# Patient Record
Sex: Female | Born: 1960 | Race: Black or African American | Hispanic: No | Smoking: Never smoker
Health system: Southern US, Community
[De-identification: ages and names within clinical notes are randomized; demographics above are authoritative.]

## PROBLEM LIST (undated history)

## (undated) DIAGNOSIS — H269 Unspecified cataract: Secondary | ICD-10-CM

## (undated) DIAGNOSIS — G8929 Other chronic pain: Secondary | ICD-10-CM

## (undated) DIAGNOSIS — F32A Depression, unspecified: Secondary | ICD-10-CM

## (undated) DIAGNOSIS — K56609 Unspecified intestinal obstruction, unspecified as to partial versus complete obstruction: Secondary | ICD-10-CM

## (undated) DIAGNOSIS — F329 Major depressive disorder, single episode, unspecified: Secondary | ICD-10-CM

## (undated) DIAGNOSIS — M1990 Unspecified osteoarthritis, unspecified site: Secondary | ICD-10-CM

## (undated) DIAGNOSIS — I10 Essential (primary) hypertension: Secondary | ICD-10-CM

## (undated) DIAGNOSIS — M545 Low back pain, unspecified: Secondary | ICD-10-CM

## (undated) DIAGNOSIS — G4700 Insomnia, unspecified: Secondary | ICD-10-CM

## (undated) DIAGNOSIS — R011 Cardiac murmur, unspecified: Secondary | ICD-10-CM

## (undated) DIAGNOSIS — E785 Hyperlipidemia, unspecified: Secondary | ICD-10-CM

## (undated) DIAGNOSIS — J45909 Unspecified asthma, uncomplicated: Secondary | ICD-10-CM

## (undated) DIAGNOSIS — J42 Unspecified chronic bronchitis: Secondary | ICD-10-CM

## (undated) DIAGNOSIS — K219 Gastro-esophageal reflux disease without esophagitis: Secondary | ICD-10-CM

## (undated) DIAGNOSIS — Z9289 Personal history of other medical treatment: Secondary | ICD-10-CM

## (undated) DIAGNOSIS — J449 Chronic obstructive pulmonary disease, unspecified: Secondary | ICD-10-CM

## (undated) HISTORY — PX: COMBINED HYSTEROSCOPY DIAGNOSTIC / D&C: SUR297

## (undated) HISTORY — PX: BOWEL RESECTION: SHX1257

## (undated) HISTORY — PX: JOINT REPLACEMENT: SHX530

## (undated) HISTORY — PX: HERNIA REPAIR: SHX51

---
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
## 2000-02-23 HISTORY — PX: ADENOIDECTOMY: SUR15

## 2000-12-05 ENCOUNTER — Inpatient Hospital Stay (HOSPITAL_COMMUNITY): Admission: AD | Admit: 2000-12-05 | Discharge: 2000-12-05 | Payer: Self-pay | Admitting: Obstetrics

## 2001-02-22 HISTORY — PX: ABDOMINAL HYSTERECTOMY: SHX81

## 2001-02-22 HISTORY — PX: ABDOMINAL EXPLORATION SURGERY: SHX538

## 2001-05-03 ENCOUNTER — Emergency Department (HOSPITAL_COMMUNITY): Admission: EM | Admit: 2001-05-03 | Discharge: 2001-05-03 | Payer: Self-pay | Admitting: Emergency Medicine

## 2001-06-29 ENCOUNTER — Encounter: Admission: RE | Admit: 2001-06-29 | Discharge: 2001-06-29 | Payer: Self-pay | Admitting: Obstetrics and Gynecology

## 2001-07-07 ENCOUNTER — Ambulatory Visit (HOSPITAL_COMMUNITY): Admission: RE | Admit: 2001-07-07 | Discharge: 2001-07-07 | Payer: Self-pay | Admitting: *Deleted

## 2001-07-20 ENCOUNTER — Encounter: Admission: RE | Admit: 2001-07-20 | Discharge: 2001-07-20 | Payer: Self-pay | Admitting: *Deleted

## 2001-08-07 ENCOUNTER — Encounter (INDEPENDENT_AMBULATORY_CARE_PROVIDER_SITE_OTHER): Payer: Self-pay | Admitting: Specialist

## 2001-08-07 ENCOUNTER — Ambulatory Visit (HOSPITAL_COMMUNITY): Admission: RE | Admit: 2001-08-07 | Discharge: 2001-08-07 | Payer: Self-pay | Admitting: Obstetrics and Gynecology

## 2001-09-20 ENCOUNTER — Encounter: Admission: RE | Admit: 2001-09-20 | Discharge: 2001-09-20 | Payer: Self-pay | Admitting: Gastroenterology

## 2001-09-20 ENCOUNTER — Encounter: Payer: Self-pay | Admitting: Gastroenterology

## 2001-09-26 ENCOUNTER — Encounter: Admission: RE | Admit: 2001-09-26 | Discharge: 2001-09-26 | Payer: Self-pay | Admitting: *Deleted

## 2001-10-19 ENCOUNTER — Encounter: Payer: Self-pay | Admitting: Gastroenterology

## 2001-10-19 ENCOUNTER — Encounter: Admission: RE | Admit: 2001-10-19 | Discharge: 2001-10-19 | Payer: Self-pay | Admitting: Gastroenterology

## 2001-10-24 ENCOUNTER — Inpatient Hospital Stay (HOSPITAL_COMMUNITY): Admission: RE | Admit: 2001-10-24 | Discharge: 2001-10-31 | Payer: Self-pay | Admitting: Obstetrics and Gynecology

## 2001-10-24 ENCOUNTER — Encounter (INDEPENDENT_AMBULATORY_CARE_PROVIDER_SITE_OTHER): Payer: Self-pay | Admitting: Specialist

## 2001-10-26 ENCOUNTER — Encounter: Payer: Self-pay | Admitting: Obstetrics and Gynecology

## 2001-10-26 ENCOUNTER — Encounter: Payer: Self-pay | Admitting: *Deleted

## 2001-11-07 ENCOUNTER — Encounter: Admission: RE | Admit: 2001-11-07 | Discharge: 2001-11-07 | Payer: Self-pay | Admitting: *Deleted

## 2001-12-15 ENCOUNTER — Ambulatory Visit (HOSPITAL_COMMUNITY): Admission: RE | Admit: 2001-12-15 | Discharge: 2001-12-15 | Payer: Self-pay | Admitting: Internal Medicine

## 2001-12-15 ENCOUNTER — Encounter: Payer: Self-pay | Admitting: Internal Medicine

## 2002-01-22 ENCOUNTER — Encounter: Payer: Self-pay | Admitting: Emergency Medicine

## 2002-01-22 ENCOUNTER — Emergency Department (HOSPITAL_COMMUNITY): Admission: EM | Admit: 2002-01-22 | Discharge: 2002-01-22 | Payer: Self-pay | Admitting: Emergency Medicine

## 2002-02-22 HISTORY — PX: ABDOMINAL EXPLORATION SURGERY: SHX538

## 2002-02-28 ENCOUNTER — Ambulatory Visit (HOSPITAL_COMMUNITY): Admission: RE | Admit: 2002-02-28 | Discharge: 2002-02-28 | Payer: Self-pay | Admitting: Gastroenterology

## 2002-03-01 ENCOUNTER — Ambulatory Visit (HOSPITAL_COMMUNITY): Admission: RE | Admit: 2002-03-01 | Discharge: 2002-03-01 | Payer: Self-pay | Admitting: Gastroenterology

## 2002-03-22 ENCOUNTER — Other Ambulatory Visit: Admission: RE | Admit: 2002-03-22 | Discharge: 2002-03-22 | Payer: Self-pay | Admitting: Obstetrics & Gynecology

## 2002-03-28 ENCOUNTER — Emergency Department (HOSPITAL_COMMUNITY): Admission: EM | Admit: 2002-03-28 | Discharge: 2002-03-28 | Payer: Self-pay | Admitting: Emergency Medicine

## 2002-03-28 ENCOUNTER — Encounter: Payer: Self-pay | Admitting: Emergency Medicine

## 2002-08-26 ENCOUNTER — Emergency Department (HOSPITAL_COMMUNITY): Admission: EM | Admit: 2002-08-26 | Discharge: 2002-08-26 | Payer: Self-pay | Admitting: Emergency Medicine

## 2002-08-29 ENCOUNTER — Inpatient Hospital Stay (HOSPITAL_COMMUNITY): Admission: EM | Admit: 2002-08-29 | Discharge: 2002-10-12 | Payer: Self-pay

## 2002-08-29 ENCOUNTER — Encounter: Payer: Self-pay | Admitting: Internal Medicine

## 2002-08-30 ENCOUNTER — Encounter: Payer: Self-pay | Admitting: General Surgery

## 2002-08-30 ENCOUNTER — Encounter: Payer: Self-pay | Admitting: Internal Medicine

## 2002-08-31 ENCOUNTER — Encounter (INDEPENDENT_AMBULATORY_CARE_PROVIDER_SITE_OTHER): Payer: Self-pay | Admitting: Specialist

## 2002-08-31 ENCOUNTER — Encounter: Payer: Self-pay | Admitting: General Surgery

## 2002-08-31 ENCOUNTER — Encounter: Payer: Self-pay | Admitting: Cardiovascular Disease

## 2002-09-01 ENCOUNTER — Encounter: Payer: Self-pay | Admitting: General Surgery

## 2002-09-02 ENCOUNTER — Encounter: Payer: Self-pay | Admitting: General Surgery

## 2002-09-03 ENCOUNTER — Encounter: Payer: Self-pay | Admitting: Pulmonary Disease

## 2002-09-04 ENCOUNTER — Encounter: Payer: Self-pay | Admitting: General Surgery

## 2002-09-05 ENCOUNTER — Encounter: Payer: Self-pay | Admitting: General Surgery

## 2002-09-06 ENCOUNTER — Encounter: Payer: Self-pay | Admitting: General Surgery

## 2002-09-07 ENCOUNTER — Encounter: Payer: Self-pay | Admitting: Critical Care Medicine

## 2002-09-09 ENCOUNTER — Encounter: Payer: Self-pay | Admitting: General Surgery

## 2002-09-10 ENCOUNTER — Encounter: Payer: Self-pay | Admitting: Surgery

## 2002-09-11 ENCOUNTER — Encounter: Payer: Self-pay | Admitting: General Surgery

## 2002-09-12 ENCOUNTER — Encounter: Payer: Self-pay | Admitting: General Surgery

## 2002-09-17 ENCOUNTER — Encounter: Payer: Self-pay | Admitting: General Surgery

## 2002-09-29 ENCOUNTER — Encounter: Payer: Self-pay | Admitting: General Surgery

## 2002-09-30 ENCOUNTER — Encounter: Payer: Self-pay | Admitting: Cardiothoracic Surgery

## 2002-10-01 ENCOUNTER — Encounter: Payer: Self-pay | Admitting: General Surgery

## 2002-10-04 ENCOUNTER — Encounter (INDEPENDENT_AMBULATORY_CARE_PROVIDER_SITE_OTHER): Payer: Self-pay | Admitting: *Deleted

## 2003-09-01 ENCOUNTER — Emergency Department (HOSPITAL_COMMUNITY): Admission: EM | Admit: 2003-09-01 | Discharge: 2003-09-01 | Payer: Self-pay | Admitting: Emergency Medicine

## 2004-01-07 ENCOUNTER — Emergency Department (HOSPITAL_COMMUNITY): Admission: EM | Admit: 2004-01-07 | Discharge: 2004-01-07 | Payer: Self-pay | Admitting: Emergency Medicine

## 2004-03-13 ENCOUNTER — Encounter: Admission: RE | Admit: 2004-03-13 | Discharge: 2004-03-13 | Payer: Self-pay | Admitting: Internal Medicine

## 2004-09-29 ENCOUNTER — Ambulatory Visit: Payer: Self-pay | Admitting: Family Medicine

## 2004-11-11 ENCOUNTER — Ambulatory Visit (HOSPITAL_COMMUNITY): Admission: RE | Admit: 2004-11-11 | Discharge: 2004-11-11 | Payer: Self-pay | Admitting: Gastroenterology

## 2004-12-11 ENCOUNTER — Encounter: Admission: RE | Admit: 2004-12-11 | Discharge: 2004-12-11 | Payer: Self-pay | Admitting: Gastroenterology

## 2005-01-05 ENCOUNTER — Emergency Department (HOSPITAL_COMMUNITY): Admission: EM | Admit: 2005-01-05 | Discharge: 2005-01-05 | Payer: Self-pay | Admitting: Emergency Medicine

## 2005-01-18 ENCOUNTER — Ambulatory Visit: Payer: Self-pay | Admitting: Family Medicine

## 2005-02-06 ENCOUNTER — Emergency Department (HOSPITAL_COMMUNITY): Admission: EM | Admit: 2005-02-06 | Discharge: 2005-02-06 | Payer: Self-pay | Admitting: Emergency Medicine

## 2005-02-20 ENCOUNTER — Emergency Department (HOSPITAL_COMMUNITY): Admission: EM | Admit: 2005-02-20 | Discharge: 2005-02-20 | Payer: Self-pay | Admitting: Family Medicine

## 2005-02-20 ENCOUNTER — Emergency Department (HOSPITAL_COMMUNITY): Admission: EM | Admit: 2005-02-20 | Discharge: 2005-02-20 | Payer: Self-pay | Admitting: Emergency Medicine

## 2005-03-29 ENCOUNTER — Ambulatory Visit: Payer: Self-pay | Admitting: Family Medicine

## 2005-04-02 ENCOUNTER — Ambulatory Visit: Payer: Self-pay | Admitting: Internal Medicine

## 2005-04-05 ENCOUNTER — Ambulatory Visit: Payer: Self-pay | Admitting: Family Medicine

## 2005-04-07 ENCOUNTER — Emergency Department (HOSPITAL_COMMUNITY): Admission: EM | Admit: 2005-04-07 | Discharge: 2005-04-07 | Payer: Self-pay | Admitting: *Deleted

## 2005-05-24 ENCOUNTER — Emergency Department (HOSPITAL_COMMUNITY): Admission: EM | Admit: 2005-05-24 | Discharge: 2005-05-24 | Payer: Self-pay | Admitting: Emergency Medicine

## 2005-09-30 ENCOUNTER — Emergency Department (HOSPITAL_COMMUNITY): Admission: EM | Admit: 2005-09-30 | Discharge: 2005-09-30 | Payer: Self-pay | Admitting: Emergency Medicine

## 2005-12-31 ENCOUNTER — Encounter: Admission: RE | Admit: 2005-12-31 | Discharge: 2005-12-31 | Payer: Self-pay | Admitting: Internal Medicine

## 2006-01-03 ENCOUNTER — Emergency Department (HOSPITAL_COMMUNITY): Admission: EM | Admit: 2006-01-03 | Discharge: 2006-01-04 | Payer: Self-pay | Admitting: Emergency Medicine

## 2006-04-06 ENCOUNTER — Encounter: Admission: RE | Admit: 2006-04-06 | Discharge: 2006-04-06 | Payer: Self-pay | Admitting: General Surgery

## 2006-06-20 ENCOUNTER — Ambulatory Visit (HOSPITAL_COMMUNITY): Admission: RE | Admit: 2006-06-20 | Discharge: 2006-06-21 | Payer: Self-pay | Admitting: Orthopedic Surgery

## 2007-07-03 ENCOUNTER — Ambulatory Visit: Payer: Self-pay | Admitting: Internal Medicine

## 2007-07-05 ENCOUNTER — Ambulatory Visit: Payer: Self-pay | Admitting: Internal Medicine

## 2007-07-26 ENCOUNTER — Ambulatory Visit: Payer: Self-pay | Admitting: Internal Medicine

## 2007-08-07 ENCOUNTER — Ambulatory Visit: Payer: Self-pay | Admitting: Internal Medicine

## 2007-08-09 ENCOUNTER — Ambulatory Visit: Payer: Self-pay | Admitting: Internal Medicine

## 2007-08-13 ENCOUNTER — Inpatient Hospital Stay (HOSPITAL_COMMUNITY): Admission: EM | Admit: 2007-08-13 | Discharge: 2007-08-18 | Payer: Self-pay | Admitting: Emergency Medicine

## 2007-11-15 ENCOUNTER — Emergency Department (HOSPITAL_COMMUNITY): Admission: EM | Admit: 2007-11-15 | Discharge: 2007-11-15 | Payer: Self-pay | Admitting: Emergency Medicine

## 2008-07-23 ENCOUNTER — Emergency Department (HOSPITAL_COMMUNITY): Admission: EM | Admit: 2008-07-23 | Discharge: 2008-07-23 | Payer: Self-pay | Admitting: Family Medicine

## 2008-08-10 ENCOUNTER — Emergency Department (HOSPITAL_COMMUNITY): Admission: EM | Admit: 2008-08-10 | Discharge: 2008-08-10 | Payer: Self-pay | Admitting: Emergency Medicine

## 2008-10-08 ENCOUNTER — Emergency Department (HOSPITAL_COMMUNITY): Admission: EM | Admit: 2008-10-08 | Discharge: 2008-10-08 | Payer: Self-pay | Admitting: Emergency Medicine

## 2008-10-11 ENCOUNTER — Ambulatory Visit: Payer: Self-pay

## 2008-10-11 ENCOUNTER — Encounter: Payer: Self-pay | Admitting: Cardiovascular Disease

## 2009-03-09 ENCOUNTER — Emergency Department (HOSPITAL_COMMUNITY): Admission: EM | Admit: 2009-03-09 | Discharge: 2009-03-09 | Payer: Self-pay | Admitting: Emergency Medicine

## 2009-03-21 ENCOUNTER — Emergency Department (HOSPITAL_COMMUNITY): Admission: EM | Admit: 2009-03-21 | Discharge: 2009-03-21 | Payer: Self-pay | Admitting: Family Medicine

## 2009-03-28 ENCOUNTER — Ambulatory Visit: Payer: Self-pay | Admitting: Family Medicine

## 2009-04-28 ENCOUNTER — Ambulatory Visit: Payer: Self-pay | Admitting: Internal Medicine

## 2009-04-28 LAB — CONVERTED CEMR LAB: TSH: 0.65 microintl units/mL (ref 0.350–4.500)

## 2009-08-29 ENCOUNTER — Emergency Department (HOSPITAL_COMMUNITY): Admission: EM | Admit: 2009-08-29 | Discharge: 2009-08-29 | Payer: Self-pay | Admitting: Emergency Medicine

## 2009-09-17 ENCOUNTER — Ambulatory Visit: Payer: Self-pay | Admitting: Internal Medicine

## 2009-10-02 ENCOUNTER — Ambulatory Visit: Payer: Self-pay | Admitting: Internal Medicine

## 2009-10-02 LAB — CONVERTED CEMR LAB
BUN: 11 mg/dL (ref 6–23)
Chloride: 106 meq/L (ref 96–112)
Creatinine, Ser: 0.89 mg/dL (ref 0.40–1.20)
Glucose, Bld: 81 mg/dL (ref 70–99)
Magnesium: 2.1 mg/dL (ref 1.5–2.5)
TSH: 1.014 microintl units/mL (ref 0.350–4.500)

## 2009-10-13 ENCOUNTER — Emergency Department (HOSPITAL_COMMUNITY): Admission: EM | Admit: 2009-10-13 | Discharge: 2009-10-13 | Payer: Self-pay | Admitting: Family Medicine

## 2009-10-16 ENCOUNTER — Ambulatory Visit: Payer: Self-pay | Admitting: Internal Medicine

## 2010-01-16 ENCOUNTER — Emergency Department (HOSPITAL_COMMUNITY): Admission: EM | Admit: 2010-01-16 | Discharge: 2010-01-16 | Payer: Self-pay | Admitting: Emergency Medicine

## 2010-02-22 HISTORY — PX: LAPAROSCOPIC INCISIONAL / UMBILICAL / VENTRAL HERNIA REPAIR: SUR789

## 2010-06-01 LAB — POCT I-STAT, CHEM 8
Calcium, Ion: 1.05 mmol/L — ABNORMAL LOW (ref 1.12–1.32)
Chloride: 101 mEq/L (ref 96–112)
Creatinine, Ser: 0.9 mg/dL (ref 0.4–1.2)
Glucose, Bld: 126 mg/dL — ABNORMAL HIGH (ref 70–99)
HCT: 45 % (ref 36.0–46.0)

## 2010-07-07 NOTE — H&P (Signed)
NAMEANALAYA, HOEY NO.:  [id number]   MEDICAL RECORD NO.:  [id number]          PATIENT TYPE:  EMS   LOCATION:  ED                           FACILITY:  [facility]   PHYSICIAN:  Elliot Cousin, M.D.    DATE OF BIRTH:  [birth year]   DATE OF ADMISSION:  08/13/2007  DATE OF DISCHARGE:                              HISTORY & PHYSICAL   PRIMARY CARE PHYSICIAN:  Merlene Laughter. Renae Gloss, M.D.   GENERAL SURGEON:  Dr. Marilynn Rail, [facility].   CHIEF COMPLAINT:  I'm having problems with my stomach.   HISTORY OF PRESENT ILLNESS:  The patient is a 50 year old woman with a  past medical history significant for multiple abdominal surgeries,  hypertension, asthmatic bronchitis, and gastroesophageal reflux disease.  She presents to the emergency department with a chief complaint of  abdominal wall pain.  Approximately 4 days ago, she noticed that her  abdomen was beginning to turn red and swollen.  The location of the  redness and swelling was approximating the previous operative sites,  primarily over the ventral surface.  Over the past 24-48 hours, she has  noticed that the redness has radiated to the left and the right, and her  skin has become warm.  The area of swelling over the reddened site has  been progressing over the past 24 hours.  In addition, she has had  worsening discomfort and pain.  She describes the pain as an aching  pain, and it has been approximately an 8/10 in intensity.  Some of the  pain tends to radiate to the left lower quadrant.  There is an area that  has not healed from her previous surgeries, and she says that this area  has not worsened.  She denies open active purulent drainage.  She denies  diarrhea, black tarry stools, or bright red blood per rectum.  She has  had intermittent nausea, but no vomiting.  She has also had associated  fever and chills.  She denies painful urination as well as upper  respiratory  infection symptoms.   During the evaluation in the emergency department, the patient is noted  to be afebrile and hemodynamically stable.  A CT scan of the abdomen and  pelvis was ordered by the emergency department physician, and the  preliminary findings revealed cholelithiasis, postoperative changes as  described, small ventral hernias containing mesenteric fat, single  patulous appearing blind-ending segment of the mid-small bowel without  evidence of intrinsic wall abnormality or obstruction.  (This may  represent surgical change versus Meckel's diverticulum.)  The patient's  white blood cell count is slightly elevated at 11.3.  The patient will  be admitted for further evaluation and management.   PAST MEDICAL HISTORY:  1. Hypertension.  2. Hyperlipidemia.  3. Asthmatic bronchitis.  4. Gastroesophageal reflux disease.  5. Chronic pain, and degenerative joint disease.  6. Migraine headaches.  7. Depression.  8. Obesity.  9. Status post right knee chondroplasty and partial synovectomy in      April 2008 by Dr. Thurston Hole  10.Status post ventral hernia  repair by Dr. Marilynn Rail at [facility] in July 2008.  11.Status post redo of abdominal surgery by Dr. Marilynn Rail at [facility] in August 2004.  12.Status post small bowel resection x2 for the dead bowel and small-      bowel obstruction in July/August 2004 by Dr. Janee Morn.  13.Status post opening of abdominal wound and placement of VAC by Dr.      Lindie Spruce in July 2004.  14.MRSA bacteremia in July/August 2004.  15.Status post exploratory laparotomy and removal of small bowel from      vagina, repair of mucosal abrasions of the ileum in September 2003      by Dr. Okey Dupre.  16.Status post total abdominal hysterectomy and bilateral salpingo-      oophorectomy for chronic pelvic pain, dysfunctional uterine      bleeding, and complex ovarian cyst by Dr. Okey Dupre in September 2003.  17.Status post  debridement of right buttock decubitus ulcer in August      2004 by Dr. Janee Morn.   MEDICATIONS:  1. Advair Diskus 500/50, 1 puff b.i.d.  2. Ambien 5 mg at bedtime.  3. Celebrex 200 mg daily.  4. Crestor 10 mg daily.  5. Darvocet-N 100, 1 tablet q.4 h. as needed for pain.  6. Triamterene/hydrochlorothiazide 37.5/25 mg daily.  7. Nexium 40 mg daily.  8. Topamax 25 mg daily.  9. Albuterol MDI 2 puffs p.r.n.  10.Allegra 180 mg daily.  11.Phenergan 12.5 mg q.6 h. as needed.  12.Paxil 20 mg daily.   ALLERGIES:  1. The patient has allergies to ASPIRIN and IBUPROFEN, which worsens      her asthma symptoms.  2. She also has an allergy to MORPHINE, which causes itching.   SOCIAL HISTORY:  The patient is currently living at the [facility].  She lost her condo to foreclosure after Mother's Day.  She has no  children.  She is currently separated.  She is disabled.  She denies  tobacco, alcohol, and illicit drug use.   FAMILY HISTORY:  Her mother is in her 63s and has diabetes mellitus.  Her maternal grandmother died of colon cancer.  Her father died of a  heart attack, age unknown.   REVIEW OF SYSTEMS:  The patient's review of systems is positive for  occasional wheezing, right knee pain, and occasional swelling in her  legs.  Otherwise, review of systems is negative.   PHYSICAL EXAMINATION:  VITAL SIGNS:  Temperature 98.1, blood pressure  124/54, pulse 106, respiratory rate 16, oxygen saturation 96% on room  air.  GENERAL:  The patient is a pleasant 50 year old woman who is currently  lying in bed, in no acute distress.  HEENT:  Head is normocephalic, nontraumatic.  Pupils equal, round, and  reactive to light.  Extraocular movements are intact.  Conjunctivae are  clear.  Sclerae are white.  Tympanic membranes not examined.  Nasal  mucosa is mildly dry.  No sinus tenderness.  Oropharynx reveals no  teeth.  Mucous membranes are mildly dry.  No posterior exudates or  erythema.   NECK:  Supple.  No adenopathy, no thyromegaly, no bruit, no JVD.  LUNGS:  Clear to auscultation bilaterally.  HEART:  S1, S2, with a soft systolic murmur.  ABDOMEN:  Obese.  Well-healed ventral abdominal scar with keloid  scarring and multiple well-healed epigastric scars.  At the bottom of  the ventral scar is an open area that has  a pink, healthy base without  any active bleeding or drainage.  Also, over the ventral/central abdomen  is diffuse erythema  extending to the left and the right of the ventral  scar.  There is slight induration over the area of erythema.  There is  mild to moderate tenderness over the cellulitic area.  There is no  tenderness at the right upper quadrant or elsewhere.  No obvious  hepatosplenomegaly.  No distention.  GU/RECTAL:  Deferred.  EXTREMITIES:  Pedal pulses are palpable bilaterally.  No pretibial  edema, and no pedal edema.  NEUROLOGIC:  The patient is alert and oriented x3.  Cranial nerves II-  XII are intact.  Strength is 5/5 throughout.  Sensation is intact.   ADMISSION LABORATORIES:  CT scan of the abdomen and pelvis results are  above.  WBC 11.3, hemoglobin 14, platelets 184.  Sodium 136, potassium  3.4, chloride 99, CO2 24, glucose 93, BUN 6, creatinine 0.85, total  bilirubin 1.3, alkaline phosphatase 93, SGOT 32, SGPT 22, total protein  7.8, albumin 3.9, calcium 9.7.   ASSESSMENT:  1. Abdominal wall cellulitis, with a history of multiple abdominal      surgeries.  The patient is currently afebrile, although her white      blood cell count is marginally elevated.  She does not appear      toxic.  However, the clinical findings warrant hospital admission      for intravenous antibiotics.  2. History of multiple abdominal surgeries, as stated above.  The      patient's recent abdominal surgery was a ventral hernia repair in      July 2008 by general surgeon Dr. Marilynn Rail at [facility] in      [place], [place].  According to  the patient, she has      been referred to a plastic surgeon, who is contemplating adding a      flap to the nonclosure area of the previous repair.  3. Mild hypokalemia.  More than likely, the patient's hypokalemia is      secondary to hydrochlorothiazide.  4. Hypertension.  The patient's blood pressure appears to be normal      and stable currently.   PLAN:  1. The patient will be admitted for further evaluation and management.  2. Will start intravenous Zosyn.  3. Supportive care and pain management.  4. Replete potassium chloride.  Will hold the      diuretics/antihypertensive medications for now and start gentle IV      fluids.  5. Consider surgical consultation in the morning.  6. Will check blood cultures if the patient becomes febrile.      Elliot Cousin, M.D.  Electronically Signed     DF/MEDQ  D:  08/13/2007  T:  08/13/2007  Job:  [id number]   cc:   Merlene Laughter. Renae Gloss, M.D.  Fax: [phone]

## 2010-07-07 NOTE — Discharge Summary (Signed)
[name], [name]                ACCOUNT NO.:  [id number]   MEDICAL RECORD NO.:  [id number]          PATIENT TYPE:  INP   LOCATION:  1336                         FACILITY:  [facility]   PHYSICIAN:  Isidor Holts, M.D.  DATE OF BIRTH:  12/17/1960   DATE OF ADMISSION:  08/13/2007  DATE OF DISCHARGE:  08/18/2007                               DISCHARGE SUMMARY   PMD:  Dr. Andi Devon.   PHYSICIANS:  General surgeon Dr. Marilynn Rail, [facility]; plastic surgeon Dr. Loraine Leriche, [facility].   DISCHARGE DIAGNOSES:  1. Abdominal wall cellulitis.  2. Lower midline chronic abdominal wall wound.  3. Status post multiple abdominal surgeries.  4. Obesity.  5. Dyslipidemia.  6. History of migraines.  7. Depression.  8. History of degenerative joint disease/chronic back pain.  9. Hypertension.  10.Bronchial asthma.  11.Gastroesophageal reflux disease.  12.History of methicillin-resistant Staphylococcus aureus bacteremia      2004.   DISCHARGE MEDICATIONS:  1. Advair Diskus (500/50) 1 puff b.i.d.  2. Ambien 5 mg p.o. q.h.s.  3. Celebrex 200 mg p.o. daily.  4. Crestor 10 mg p.o. daily.  5. Darvocet-N 100 one p.o. p.r.n. q.4 hourly.  6. Triamterene/hydrochlorothiazide (37.5/25) one p.o. daily.  7. Nexium 40 mg p.o. daily.  8. Topamax 25 mg p.o. daily.  9. Albuterol inhaler 2 puffs p.r.n.  10.Allegra 180 mg p.o. daily.  11.Paxil 20 mg p.o. daily.  12.Phenergan 12.5 mg p.o. p.r.n. q.6 hourly.  13.Doxycycline 100 mg p.o. t.i.d. for 10 days only.  14.Lotrimin AF 1% cream apply topically b.i.d. to affected areas of      groin for one week.   PROCEDURES:  1. Abdominal/pelvic CT scan dated August 13, 2007.  This showed      cholelithiasis, postoperative changes, small ventral hernia      containing mesenteric fat, single patulous appearing, blind ending      segment of mid small bowel without evidence of intrinsic wall      abnormality or  obstruction.  This may represent surgical change      versus Meckel's diverticulum.  Pelvic CT scan was unremarkable.  2. Chest x-ray dated August 15, 2007:  This showed PICC placement to mid      SVC.   CONSULTATIONS:  Dr. Darnell Level, general surgeon.   HISTORY OF PRESENT ILLNESS:  Admission history as in H&P notes of August 13, 2007 dictated by Dr. Elliot Cousin.  However, in brief this is a 50-  year-old female, with known history of hypertension, dyslipidemia,  bronchial asthma, GERD, chronic pain, DJD, migraine headaches,  depression , obesity, status post right knee chondroplasty and partial  synovectomy April 2008, status post ventral hernia repair July 2008,  status post redo of abdominal surgery August 2004, status post small  bowel resection x2 for ischemic bowel/bowel obstruction July/August  2004, status post opening of abdominal wall wound and placement of wound  vac July 2004,  MRSA bacteremia July/August 2004.  Status post  exploratory laparotomy September 2003, status post total abdominal  hysterectomy and bilateral salpingo-oophorectomy for  chronic pelvic  pain.  Status post uterine bleeding and complex ovarian cyst September  2003, status post debridement right buttock decubitus ulcer August 2004.  She presented with a four day history of redness, pain, and swelling of  lower abdominal wall.  On initial evaluation she was found to have a  mildly elevated white cell count of 11.3.  She was afebrile.  Physical  examination demonstrated local inflammatory phenomena of the anterior  abdominal wall.  Pelvic/abdominal CT scan unremarkable for  intraabdominal acute pathology.  She was admitted for further  evaluation, investigation, and management.   CLINICAL COURSE:  1. Abdominal wall cellulitis.  For details of presentation, refer to      admission history above.  The patient was consulted by Dr. Darnell Level, general surgeon, who opined that patient had no drainable       abscess or other acute surgical pathology, and recommended      conservative treatment with antibiotics.  She was managed with a      combination of intravenous Zosyn and Vancomycin, and by August 18, 2007, local inflammatory phenomena had subsided.  The patient felt      quite well, and was very keen to be discharged.  Of note her white      cell count had on August 17, 2007, dropped down to 4.8, and she had      no episodes of pyrexia during the course of her hospitalization.      She has been transitioned to oral Doxycycline for further 10 days      of treatment.   1. Lower midline abdominal wound.  The patient has a chronic      nonhealing wound of the lower abdomen, and has a scheduled      appointment to see Dr. Loraine Leriche, plastic surgeon, at [facility] on August 22, 2007.  She was evaluated by      wound management care team during the course of this      hospitalization.  Wound showed no obvious evidence of infection.      She was recommended Aquacel to the abdominal wound to provide      antimicrobial benefits and promote healing, as well as dry      dressings.   1. Hypertension.  This was controlled during the course of patient's      hospitalization.   1. Chronic pain.  This did not prove problematic.   1. Bronchial asthma.  The patient remained asymptomatic from this      viewpoint.   1. GERD.  The patient was continued on proton pump inhibitor, and were      no problems referable to this.   1. Migraine headaches.  This did not prove problematic during the      course of patient's hospitalization.   1. History of depression.  The patient's mood remained stable during      the course of this hospitalization.   DISPOSITION:  The patient was on August 18, 2007 considered clinically  stable for discharge. she was therefore, discharged accordingly.  She,  however, complained of pruritus vulvae, and physical examination  revealed vulvovaginal  candidiasis with intertrigo in the crural folds.  She has been administered a single dose of Diflucan 150 mg p.o., and  then commenced on Lotrimin AF 1% cream to be applied topically to the  crural folds b.i.d. for  one week.   DIET:  Heart healthy.   ACTIVITY:  Activity as tolerated.  Wound care, Aquacel, and dry  dressings to lower abdominal wall wound.   FOLLOW-UP INSTRUCTIONS:  The patient is recommended to follow up with  her primary MD, Dr. Andi Devon, per prior scheduled appointment.  She is also to follow up with Dr. Loraine Leriche, plastic surgeon, at [facility] on August 22, 2007.  She already has an  appointment scheduled, and has been urged to keep this appointment.  Otherwise, she is to follow up routinely with her primary surgeon, Dr.  Marilynn Rail, at [facility].  All this has been  communicated to the patient.  She has verbalized standing.      Isidor Holts, M.D.  Electronically Signed     CO/MEDQ  D:  08/18/2007  T:  08/18/2007  Job:  [id number]   cc:   Merlene Laughter. Renae Gloss, M.D.  Fax: [phone]

## 2010-07-07 NOTE — Consult Note (Signed)
NAMEJENNA, [name]                ACCOUNT NO.:  [id number]   MEDICAL RECORD NO.:  [id number]          PATIENT TYPE:  INP   LOCATION:  1336                         FACILITY:  [facility]   PHYSICIAN:  Velora Heckler, MD      DATE OF BIRTH:  Nov 05, 1960   DATE OF CONSULTATION:  08/14/2007  DATE OF DISCHARGE:                                 CONSULTATION   REFERRING PHYSICIAN:  Dr. Elliot Cousin, [facility].   CHIEF COMPLAINT:  Cellulitis abdominal wall.   HISTORY OF PRESENT ILLNESS:  [name] is a 50 year old black female  admitted on the medical service August 13, 2007, with abdominal pain.  The  patient has a complex past surgical history, having previously been seen  in our practice by Dr. Jimmye Norman and Dr. Violeta Gelinas.  The patient  underwent ventral hernia repair by Dr. Rockie Neighbours at [facility] in July 2008.  She has had problems with a  nonhealing abdominal wound since that time.  She is currently scheduled  to be seen by Dr. Dawna Part in plastic surgery at [facility] for evaluation of her nonhealing abdominal wound.  Over  the past 2 days, however, she developed abdominal wall pain.  She  presented and was admitted on the medical service yesterday and started  on intravenous antibiotics.  The patient denies any drainage.  She  describes mainly a burning sensation.  She states that the actual  nonhealing portion of the wound is unchanged.  The patient was evaluated  including a CT scan abdomen and pelvis which showed calcified gallstones  but no acute intra-abdominal processes.  There were some recurrent  ventral herniae but no sign of abscess.  General surgery is now called  to evaluate the wound for possible need for surgical debridement.   PAST MEDICAL HISTORY:  1. History of hypertension.  2. Hyperlipidemia.  3. Bronchitis.  4. Gastroesophageal reflux.  5. Degenerative joint disease.  6. Migraine headaches.  7.  Depression.  8. Obesity.  9. Status post knee surgery by Dr. Salvatore Marvel April 2008.  10.Status post ventral hernia repair by Dr. Rockie Neighbours, September 01, 2006.  11.History of small bowel resections 2004.  12.History of MRSA bacteremia 2004.  13.Status post total abdominal hysterectomy and salpingo-oophorectomy.   PREHOSPITAL MEDICATIONS:  Advair, Ambien, Celebrex, Crestor, Darvocet,  triamterene/hydrochlorothiazide, Nexium, Topamax, albuterol, Allegra,  Phenergan, Paxil.   ALLERGIES:  1. ASPIRIN.  2. IBUPROFEN.  3. MORPHINE.   SOCIAL HISTORY:  The patient currently lives in [place].  She has  no children.  She denies tobacco and alcohol use.   FAMILY HISTORY:  Notable for diabetes mellitus and colon cancer.   A 15-system review documented in the history and physical.   EXAMINATION:  A 50 year old bright, alert, black female on ward [address]  [facility].  VITAL SIGNS:  Show temperature 98.3, pulse 87, respirations 22, blood  pressure 102/61, O2 saturation 96% room air.  HEENT:  Shows her to be normocephalic, atraumatic.  Sclerae clear.  Conjunctivae clear.  Mucous membranes moist.  NECK:  Supple, nontender, without mass.  CHEST:  Clear to auscultation bilaterally without rales, rhonchi or  wheeze.  CARDIAC EXAM:  Shows regular rate and rhythm without murmur.  Peripheral  pulses are full.  ABDOMEN:  Soft, obese.  There are bowel sounds on auscultation.  There  are multiple surgical wounds.  There is an open shallow midline wound  inferiorly measuring approximately 2 x 4 cm with granulation tissue.  There is no drainage.  There is erythema and induration centered at the  umbilicus and extending circumferentially for several centimeters.  This  is mildly tender.  There is no fluctuance.  There is no drainage.  EXTREMITIES:  Nontender without edema.  NEUROLOGICALLY:  The patient is alert and oriented without focal  neurologic deficit.   LABORATORY  STUDIES:  White count 6.1, down from 11; hemoglobin 12.3;  platelet count 183,000.  Electrolytes are normal.  Liver function tests  are normal.   RADIOGRAPHIC STUDIES:  CT scan abdomen and pelvis from August 13, 2007, at  [facility] showing calcified gallstones.  There are multiple  small incisional herniae containing omental fat.  There is no evidence  of a subcutaneous fluid collection or abscess.   IMPRESSION:  1. Cellulitis abdominal wall.  2. Nonhealing wound abdominal wall, lower midline.   PLAN:  Agree with treatment with intravenous Zosyn.  No role for acute  surgical debridement or drainage at present.  Will follow with you.      Velora Heckler, MD  Electronically Signed     TMG/MEDQ  D:  08/14/2007  T:  08/14/2007  Job:  [id number]   cc:   Elliot Cousin, M.D.   Rockie Neighbours, MD  [facility]

## 2010-07-10 NOTE — Op Note (Signed)
NAME:  [name], [name]                          ACCOUNT NO.:  [id number]   MEDICAL RECORD NO.:  [id number]                   PATIENT TYPE:  AMB   LOCATION:  ENDO                                 FACILITY:  [facility]   PHYSICIAN:  Anselmo Rod, M.D.               DATE OF BIRTH:  1960-09-06   DATE OF PROCEDURE:  03/01/2002  DATE OF DISCHARGE:                                 OPERATIVE REPORT   PROCEDURE PERFORMED:  Colonoscopy.   ENDOSCOPIST:  Charna Elizabeth, M.D.   INSTRUMENT USED:  Olympus video colonoscope.   INDICATIONS FOR PROCEDURE:  The patient is a 50 year old African-American  female with a history of colon cancer in her mother.  Rule out colonic  polyps, etc.   PRE-PROCEDURE PREPARATION:  Informed consent was procured from the patient.  The patient was fasted for eight hours prior to the procedure.  The patient  was prepped for colonoscopy with MiraLax yesterday but was not completely  clean and therefore she was reprepped with a gallon of NuLytely and was  advised to come back to the endoscopy unit today for repeat colonoscopy.   PRE-PROCEDURE PHYSICAL:  The patient had stable vital signs.  Neck supple.  Chest clear to auscultation.  S1 and S2 regular.  Abdomen obese, nontender  with well-healed surgical scars from recent gynecological surgery.  No  masses palpable.   DESCRIPTION OF PROCEDURE:  The patient was placed in left lateral decubitus  position and sedated with 100 mg of Demerol and 10 mg of Versed  intravenously.  Once the patient was adequately sedated and maintained on  low flow oxygen and continuous cardiac monitoring, the Olympus video  colonoscope was advanced from the rectum to the cecum with extreme  difficulty.  There was a large amount of stool in the colon.  Visualization  was not adequate.  However, the patient's position was changed from the left  lateral to the supine and the right lateral position.  The cecum was  reached.  The cecum was full of  stool.  Even though the procedure was  congruent to the cecum, I was not convinced that there was adequate  visualization to rule out colonic polyps or masses.   IMPRESSION:  Very poor prep in spite of repeat prep with GoLYTELY and a  prior prep with MiraLax.    RECOMMENDATIONS:  The patient will be reprepped and redone some time this  summer once her constipation problems have been addressed.  Hopefully at  that time, the prep will be better and visualization will be good.                                                   Anselmo Rod, M.D.    JNM/MEDQ  D:  03/01/2002  T:  03/01/2002  Job:  [id number]   cc:   Merlene Laughter. Renae Gloss, M.D.  [phone] [address]  [place]  [place] [address]  Fax: [phone]

## 2010-07-10 NOTE — Discharge Summary (Signed)
NAME:  [name], [name]                          ACCOUNT NO.:  [id number]   MEDICAL RECORD NO.:  [id number]                   PATIENT TYPE:  INP   LOCATION:  9325                                 FACILITY:  [facility]   PHYSICIAN:  Phil D. Okey Dupre, M.D.                  DATE OF BIRTH:  1960-12-24   DATE OF ADMISSION:  10/24/2001  DATE OF DISCHARGE:  10/31/2001                                 DISCHARGE SUMMARY   HOSPITAL COURSE:  The patient, a 50 year old black female with symptomatic  leiomyomata uteri, underwent total abdominal hysterectomy and bilateral  oophorectomy on the day of admission.  Two days after admission, we were  called to see the patient because she got something falling out, just  falling down into the vagina.  She was examined and we noted an evisceration  of small bowel.  She was immediately taken to the operating room.  An  exploratory laparotomy was undertaken.  The repair of the defect was taken  care of.  Some small abrasions in the bowel were repaired.  From that time,  she had fever to 100-101, generally twice a day with normal white count.  She had been on triple therapy through a central line that was started  because her veins were so poor they could not find any peripheral vein, and  with exception of fever has done exceedingly well.  Chronic pain was the  patient's main complaint and heavy bleeding before surgery was originally  done.  The patient at time of discharge had a hemoglobin more than 9 grams  and a normal white count which had persisted throughout her stay.  She has  been afebrile 36 hours.  The staples were removed.  The wound is healing  well.  Abdomen is soft, flat with good bowel sounds with the patient having  bowel movements and passing flatus.  She is voiding normally and the central  line was removed by anesthesia.   DISCHARGE DIAGNOSIS:  The patient undergoing total abdominal hysterectomy  and bilateral salpingo-oophorectomy for chronic pain  and menorrhagia  complicated by postoperative small bowel evisceration into the vagina and  underwent exploratory laparotomy, removal of the small bowel from the vagina  via the abdomen, repair of mucosal abrasions in the ileum and closure of  vaginal cuff.   FOLLOW UP:  The patient is to return to the GYN clinic one week from date of  discharge.   DISCHARGE MEDICATIONS:  1. Darvocet-N 100.  2. Dilaudid 2 mg.                                               Phil D. Okey Dupre, M.D.    PDR/MEDQ  D:  10/31/2001  T:  10/31/2001  Job:  [id number]

## 2010-07-10 NOTE — Discharge Summary (Signed)
NAME:  [name], [name] NO.:  [id number]   MEDICAL RECORD NO.:  [id number]                   PATIENT TYPE:  INP   LOCATION:  2309                                 FACILITY:  [facility]   PHYSICIAN:  Gabrielle Dare. Janee Morn, M.D.             DATE OF BIRTH:  December 03, 1960   DATE OF PROCEDURE:  DATE OF DISCHARGE:  10/12/2002                                 DISCHARGE SUMMARY   DISCHARGE DIAGNOSES:  1. Status post small-bowel resection x2 for dead bowel and small-bowel     obstruction.  2. Open abdomen.  3. Small-bowel fistula.  4. Chronic decubitus.  5. Recent MRSA bacteremia.   HISTORY OF PRESENT ILLNESS:  The patient is a 50 year old African-American  female who was admitted by Dr. Kellie Shropshire her primary medical doctor for  nausea, vomiting, and dehydration that had been going on for several days.  Workup consistent of small-bowel obstruction and I assumed her care.   HOSPITAL COURSE:  After my evaluation I took the patient emergently to the  operating room for exploration. She had two lengths of necrotic small bowel  and a very complicated small-bowel obstruction. Her recent surgery had been  a hysterectomy last fall complicated by an early postoperative small bowel  prolapse through the vaginal cuff requiring reexploration. During this  operation her adhesions were severe and all down in the pelvis area. Two  sections of small bowel were resected and anastomosed. The patient was  persistently septic and hypotensive. Postoperatively she was taken to the  ICU. She remained in shock and she was undergoing continued aggressive  resuscitation. She developed some abdominal compartment syndrome and her  abdomen was opened in the ICU and an abdominal vac dressing was placed. She  remained in septic shock for the next several days. Dr. Delford Field from the  critical care medicine service was consulted as well. The patient was given  a course of Zygis and aggressive  ventilatory and antibiotic support in  addition to volume resuscitation. She gradually improved from her sepsis  moving on to be taken off the Zygis and gradually weaned from the  ventilator. She had required several transfusions during this course. Her  nutrition was maintained on TMA and her wound was managed with abdominal vac  dressing changes every other day. She had an open area of contusion around  one of the visible anastomotic sites on her wound. This area progressed to  fistulize likely secondary to ischemia from her prolonged shunt stay. We  continued to manage her abdomen with the vac dressing. Her fistula output  was replaced 1.5 cc/cc. Her nutrition was maintained with TMA. Her fistula  was not amenable to closure due to her open abdomen and viability of her  bowel. We used vac techniques, which have been shown to have some chance of  decreasing the time to closure. The fistula gradually enlarged approaching  kind of a  double barrel ileostomy appearance and remained high output over  the next couple of weeks. This output was usually about 500 cc every eight  hours. She developed fevers. Cultures and workup demonstrated a MRSA  bacteremia. Her PICC line, which we had transitioned from a central line for  her TMA was taken out. She was given a line holiday, treated with Vancomycin  and a new PICC line was inserted on October 01, 2002. Further workup on the  patient included an HIV test, which was negative. Once the patient continued  to remain hemodynamically stable she developed a right buttock decubitus  despite being on an inflation bed and this had some necrotic eschar and was  treated operatively on October 04, 2002. Wet to dry dressings have been  continuing. The patient is continuing on Vancomycin with the most recent  blood cultures still growing MRSA. Her wound changes have been going well.  Her wound has actually contracted down significantly and recently we have   discussed care of this patient with Dr. Marilynn Rail at [facility]  and we are trying to get a course of Octreotide. This has not significant  decreased her fistula output. Otherwise her prealbumins have ranged from11  to 20 most recently and it has dropped back down again to around 11.3. Urine  nitrogen counts were pending. Per the patient's request we discussed her  care further with Dr. Marilynn Rail at [facility] and he has agreed  to take over her care with further wound, ostomy, and fistula management.  Once she is discharged from [facility], which should not require too long of an  admission, that outpatient being able to be managed by the patient's primary  care doctor. I discussed this with Dr. Kellie Shropshire who says she has a  pharmacy who she works with for home TMA and she will be happy to manage  that once she is discharged from [facility]. I spoke with Dr. Marilynn Rail again  and we plan transfer today to [facility]   DISCHARGE MEDICATIONS:  1. Xopenex 1.25 mg in 3 ml inhaled q.8h.  2. Lovenox 40 mg subcu daily.  3. Octreotide 100 mcg IV q.8h.  4. Duragesic patch 75 mcg per hour.  5. Dilaudid PCA per low dose protocol.  6. Protonix 40 mg IV daily.  7. Vancomycin 1500 mg IV q.12h.  8. TMA as ordered by pharmacy.  9. Phenergan 25 mg IV q.4h. p.r.n.  10.      Zofran 4 mg IV q.4h. p.r.n.  11.      Tylenol 650 mg p.r.n. q.4-6h. p.r.n.  12.      Compazine 5-10 mg IV q.6h. p.r.n.  13.      Reglan 10 mg IV q.6h. p.r.n.  14.      Benadryl 25 mg p.o. q.h.s. p.r.n.   DIET:  N.P.O.   DISCHARGE ACTIVITIES:  Bedrest with mobilization of position.   DISCHARGE FOLLOW UP:  The patient is to be transferred to Dr. Marilynn Rail  service at [facility].                                               Gabrielle Dare Janee Morn, M.D.    BET/MEDQ  D:  10/12/2002  T:  10/12/2002  Job:  [id number]

## 2010-07-10 NOTE — Op Note (Signed)
NAMEBRYANNAH, BOSTON                ACCOUNT NO.:  [id number]   MEDICAL RECORD NO.:  [id number]          PATIENT TYPE:  OIB   LOCATION:  5013                         FACILITY:  [facility]   PHYSICIAN:  Robert A. Thurston Hole, M.D. 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  Marland Kitchen   OPERATION/PROCEDURE:  1. Right knee examination under anesthesia followed by arthroscopic      partial medial and  lateral meniscectomy.  2. A right knee chondroplasty with partial synovectomy.   SURGEON:  Elana Alm. Thurston Hole, M.D.   ASSISTANT:  Kirstin Shepperson, PA-C.   ANESTHESIA:  General.   OPERATIVE TIME:  40 minutes.   COMPLICATIONS:  [name].   DESCRIPTION OF PROCEDURE:  Mrs. Slocomb was brought to operating room on  June 20, 2006 after a femoral nerve block had been placed __________ by  anesthesia.  She was placed on operative table in the supine position.  She received Ancef 1 grams IV preoperatively  e for prophylaxis.  After  being placed under general anesthesia, her right knee was examined.  Range of motion from 0 125 degrees, 1 to 2+ crepitation.  Knee stable to  ligamentous exam with normal patellar tracking.  The knee was sterilely  injected with 0.25% Marcaine with epinephrine.  The right leg was then  prepped using sterile DuraPrep and draped using sterile technique.  Originally through an anterolateral portal, the arthroscope with a pump  attached was placed into an anteromedial portal.  An arthroscopic probe  was placed.  On initial inspection of the medial compartment, she was  found to have a 25% grade 4 and the rest grade 3 chondromalacia which  was debrided.  She had tearing of the posterior  and medial horn of the  medial meniscus of which 40-50% was resected back to stable rim.  Intercondylar notch was inspected, anterior and posterior cruciate  ligaments were normal.  Lateral compartment inspected.  She had 25-30%  grade 3 chondromalacia which was debrided.  Lateral meniscus tear of  posterior and lateral horn of which 25-30% was resected back to stable  rim.  Patellofemoral joint showed 30-40% grade 3 chondromalacia on the  patellofemoral groove and this was debrided.  The patella tracked  normally.  Moderate synovitis in medial lateral gutters were debrided.  Otherwise they are free of pathology.  After this was done, it was felt  that all pathology been satisfactorily addressed.  The  instruments removed.  Portals closed with 3-0 nylon suture and injected  with 0.25% Marcaine with epinephrine and 4 mg morphine.  Sterile  dressings were applied and a long-leg splint and the patient awakened  and taken to recovery in stable condition.  Needle and sponge counts  correct x2 at the end of the case.      Robert A. Thurston Hole, M.D.  Electronically Signed     RAW/MEDQ  D:  06/20/2006  T:  06/20/2006  Job:  [id number]

## 2010-07-10 NOTE — Op Note (Signed)
   NAME:  [name], [name] NO.:  [id number]   MEDICAL RECORD NO.:  [id number]                   PATIENT TYPE:  INP   LOCATION:  2309                                 FACILITY:  [facility]   PHYSICIAN:  Gabrielle Dare. Janee Morn, M.D.             DATE OF BIRTH:  01-08-1961   DATE OF PROCEDURE:  08/30/2002  DATE OF DISCHARGE:                                 OPERATIVE REPORT   PREOPERATIVE DIAGNOSIS:  Small bowel obstruction and hypotension secondary  to hypovolemia.   POSTOPERATIVE DIAGNOSIS:  Small bowel obstruction and hypotension secondary  to hypovolemia.   PROCEDURE:  Insertion of left subclavian vein triple lumen catheter.   SURGEON:  Gabrielle Dare. Janee Morn, M.D.   HISTORY OF PRESENT ILLNESS:  The patient is a 50 year old African American  female who I evaluated in consultation for small bowel obstruction and was  asked by Dr. Andi Devon also to place a triple lumen catheter for IV  access.   PROCEDURE IN DETAIL:  Informed consent was obtained.  The patient was placed  in Trendelenburg position with a roll between her shoulder blades.  Her left  clavicle and neck area were prepped and draped in a sterile fashion and  using Seldinger technique, the subclavian vein was entered and a triple  lumen catheter was placed without difficulty.  Only one stick was required.  The catheter was checked and a good return from all three ports and it was  sutured into place.  A sterile dressing was applied.  The patient tolerated  the procedure very well and we will check a chest x-ray.                                               Gabrielle Dare Janee Morn, M.D.    BET/MEDQ  D:  08/30/2002  T:  08/30/2002  Job:  [id number]

## 2010-07-10 NOTE — H&P (Signed)
NAME:  [name], [name] NO.:  [id number]   MEDICAL RECORD NO.:  [id number]                   PATIENT TYPE:  EMS   LOCATION:  MAJO                                 FACILITY:  [facility]   PHYSICIAN:  Merlene Laughter. Renae Gloss, M.D.           DATE OF BIRTH:  1960/11/03   DATE OF ADMISSION:  08/29/2002  DATE OF DISCHARGE:                                HISTORY & PHYSICAL   CHIEF COMPLAINT:  Nausea, vomiting, and diarrhea.   HISTORY OF PRESENT ILLNESS:  [name] is a 50 year old lady who presents  with a four-day history of persistent diarrhea, as well as nausea and  vomiting associated with mid epigastric abdominal pain.  She also reports  chills, but denies fever.  She reports dizziness and presyncope as well with  the above symptoms.  She has been evaluated at the [facility] Emergency  Room for gastroenteritis two days ago, but was released with an unremarkable  evaluation.  She presents today again with persistent symptoms.  She has no  other acute constitutional symptom complaints at this time.   ALLERGIES:  1. ASPIRIN.  2. IBUPROFEN.   MEDICATIONS:  Allegra, Phenergan, Paxil, Nexium, HCTZ, and albuterol  nebulizer.   PAST MEDICAL HISTORY:  1. Hypertension.  2. Asthmatic bronchitis.  3. GERD.  4. History of fibroids, status post TAH.   FAMILY HISTORY:  Significant for colon cancer in mother.   SOCIAL HISTORY:  [name] is unemployed.  She denies tobacco, alcohol, or  drug abuse.   REVIEW OF SYSTEMS:  Per HPI and 14 point systems were reviewed.   PHYSICAL EXAMINATION:  GENERAL APPEARANCE:  A well-developed, well-  nourished, black female complaining of abdominal pain.  VITAL SIGNS:  Blood pressure systolic in the 80s, heart rate 136,  temperature 98 degrees.  HEENT:  TMs within normal limits.  No oropharyngeal lesions.  NECK:  Supple.  No masses.  Carotids 2+ with no bruits.  LUNGS:  Clear to auscultation bilaterally.  HEART:  S1 and S2  regular rate and rhythm.  No murmurs, rubs, or gallops.  ABDOMEN:  Soft and nondistended.  Diffuse tenderness.  Normoactive bowel  sounds.  EXTREMITIES:  No cyanosis, clubbing, or edema.  NEUROLOGIC:  Alert and oriented x 3.  Cranial nerves intact.   ASSESSMENT AND PLAN:  1. Hypotension secondary to dehydration from nausea, vomiting, and diarrhea.     The etiology of her abdominal symptoms is unclear, however, it may be     secondary to progressive gastroenteritis as she states that her symptoms     did start after eating a [employer] sandwich on Sunday, August 26, 2002.  She     will be admitted to telemetry where we will continue with aggressive     hydration.  She will also be started on IV Cipro and Protonix for     coverage of bacterial gastroenteritis.  Stool cultures and urinalysis  will be obtained as well.  2. Hypertension.  Ms. Shockley has a history of hypertension requiring HCTZ.     However, obviously given her hypotension at this time, her     antihypertensive regimen will be held.  3. Asthma.  This problem is currently well controlled.                                               Merlene Laughter. Renae Gloss, M.D.    KRS/MEDQ  D:  08/29/2002  T:  08/29/2002  Job:  [id number]

## 2010-07-10 NOTE — Op Note (Signed)
NAME:  BERNEICE, ZETTLEMOYER NO.:  [id number]   MEDICAL RECORD NO.:  [id number]                   PATIENT TYPE:  INP   LOCATION:  2309                                 FACILITY:  [facility]   PHYSICIAN:  Jimmye Norman III, M.D.               DATE OF BIRTH:  28-Sep-1960   DATE OF PROCEDURE:  08/31/2002  DATE OF DISCHARGE:                                 OPERATIVE REPORT   PREOPERATIVE DIAGNOSIS:  Intraabdominal compartment syndrome with  intraabdominal hypertension.   POSTOPERATIVE DIAGNOSIS:  Intraabdominal compartment syndrome with  intraabdominal hypertension.   PROCEDURE:  Opening of abdominal wound with placement of abdominal vacuum  assisted closure device.   SURGEON:  Jimmye Norman, M.D.   ASSISTANT:  Lazaro Arms, P.A.   RESULTS:  The procedure was done at the patient's bedside in the surgical  intensive care unit.  No bleeding.  Ascites was drained, approximately a  liter.   INDICATIONS FOR PROCEDURE:  The patient is a 50 year old who had a bowel  resection and exploration yesterday for infarcted bowel who now has  developed abdominal compartment syndrome with all the resultant problems  associated with it.  Intraabdominal pressures were measured at 28 to 41  warranting opening of her abdominal wound.   DESCRIPTION OF PROCEDURE:  At the bedside, the patient's margins of her skin  where retention sutures are were cleaned with alcohol swabs and Betadine was  used initially.  We placed abdominal drapes in the usual manner keeping the  field sterile.  We cut her retention scissors using sterile scissors and  then subsequently cut the running, I believe Novofil suture, from the wound  with prompt opening of the fascial edges and marked increase in pulmonary  compliance and tidal volume.  Approximately 1000 mL of ascitic fluid were  aspirated with the Yankauer suction.  The bowel at the anastomosis was  dusky, patchy and dark in some spots requiring some  second look in the  future.  However, there was evidence of no leakage or sloughing.  This could  represent ecchymosis in the wall also; therefore, resection was not  warranted.  We went ahead and placed the abdominal VAC dressing placing the  plastic sleeves underneath the fascial margin circumferentially and then  placing the two layered sponge on top with the [facility] plastic on top of that  providing adequate suction at 125 mmHg.  This hooked down well and with  compliance improvement in her lungs continued her tidal volume went up from  400 to 500 and from 490 to 480 on pressure control ventilation with a minute  volume improvement by five liters.  Kathrin Ruddy, M.D.    JW/MEDQ  D:  08/31/2002  T:  09/01/2002  Job:  [id number]

## 2010-07-10 NOTE — Op Note (Signed)
   NAME:  [name], [name]                          ACCOUNT NO.:  [id number]   MEDICAL RECORD NO.:  [id number]                   PATIENT TYPE:  AMB   LOCATION:  ENDO                                 FACILITY:  [facility]   PHYSICIAN:  Anselmo Rod, M.D.               DATE OF BIRTH:  10-31-1960   DATE OF PROCEDURE:  02/28/2002  DATE OF DISCHARGE:                                 OPERATIVE REPORT   PROCEDURE:  Colonoscopy changed to flexible sigmoidoscopy.   ENDOSCOPIST:  Anselmo Rod, M.D.   INSTRUMENT USED:  Olympus video colonoscope.   INDICATIONS FOR PROCEDURE:  Family history of bone cancer and ongoing  constipation in a 50 year old African-American female, rule out colonic  polyps.   <PREPROCEDURE PREPARATION/>  Informed consent was procured from the patient. The patient was  fasted for  8 hours prior to the procedure. She was prepped with a bottle of MiraLax and  a bottle of Gatorade the night prior to  the procedure.   PREPROCEDURE PHYSICAL:  The patient had stable vital signs, neck supple.  Chest clear to auscultation, S1, S2. Abdomen soft with normal bowel sounds.   DESCRIPTION OF PROCEDURE:  The patient was placed in the left lateral  decubitus position and sedated with 100 mg of Demerol and 10 mg of Versed  intravenously. Once the patient was adequately sedated and maintained on low  flow oxygen  and continuous cardiac monitoring, the Olympus video  colonoscope was advanced into the rectum to 40 cm with extreme difficulty.   There was solid stool in the colon. The procedure was aborted with plans to  reprep the patient with 1 gallon of GoLYTELY and redo the procedure in the  morning.                                               Anselmo Rod, M.D.    JNM/MEDQ  D:  02/28/2002  T:  02/28/2002  Job:  [id number]   cc:   Merlene Laughter. Renae Gloss, M.D.  [phone] [address]  [place]  [place] [address]  Fax: [phone]

## 2010-07-10 NOTE — Op Note (Signed)
   NAME:  [name], CAMBRE NO.:  [id number]   MEDICAL RECORD NO.:  [id number]                   PATIENT TYPE:  INP   LOCATION:  2309                                 FACILITY:  [facility]   PHYSICIAN:  Gabrielle Dare. Janee Morn, M.D.             DATE OF BIRTH:  1960-04-02   DATE OF PROCEDURE:  10/04/2002  DATE OF DISCHARGE:                                 OPERATIVE REPORT   PREOPERATIVE DIAGNOSIS:  1. Right buttock decubitus ulcer.  2. Open abdomen with small bowel fistula.   POSTOPERATIVE DIAGNOSIS:  1. Right buttock decubitus ulcer.  2. Open abdomen with small bowel fistula.   PROCEDURE:  1. Debridement right buttock decubitus ulcer.  2. Changing of abdominal Vac dressing.   PROCEDURE IN DETAIL:  Informed consent was obtained.  The patient received  intravenous  antibiotics.  She was brought to the operating room and general  anesthesia was administered.  Initially, she was positioned in the left  lateral decubitus exposing her right buttock decubitus ulcer.  This area was  prepped and draped in a sterile fashion and then using sharp debridement,  the necrotic tissue was debrided down to bleeding subcutaneous fat.  Good  hemostasis was obtained and all devitalized tissue was removed and sent to  pathology.  Once we had good hemostasis, the area was irrigated and dressed  with a wet to dry sterile dressing.  She was repositioned on her back.  Her  abdomen was draped.  Her old Vac dressing was removed.  The wound and  fascial area were copiously irrigated and cleaned off and subsequently we  adjusted the internal Vac bag sponge by trimming it down somewhat and that  was placed and tucked in as best we could around circumferentially.  A flat  Blake drain was placed on top of this.  The remainder of the abdominal Vac  dressing which was comprised of two more sponges were cut to size and placed  and then the Vac drapes were placed to get a good seal.  The tubing  was  brought out laterally.  The Vac suction was placed and an excellent seal was  noted.  We left the DuoDerm inferior to the skin edge to protect it.  This  completed the procedure.  Sponge and instrument counts were correct.  The  patient tolerated the procedure well without apparent complications.  She  was taken to the recovery room in stable condition.                                               Gabrielle Dare Janee Morn, M.D.    BET/MEDQ  D:  10/04/2002  T:  10/04/2002  Job:  [id number]

## 2010-07-10 NOTE — Consult Note (Signed)
NAME:  [name], [name] NO.:  [id number]   MEDICAL RECORD NO.:  [id number]                   PATIENT TYPE:  INP   LOCATION:  2309                                 FACILITY:  [facility]   PHYSICIAN:  Gabrielle Dare. Janee Morn, M.D.             DATE OF BIRTH:  [birth year]   DATE OF CONSULTATION:  08/30/2002  DATE OF DISCHARGE:                                   CONSULTATION   REFERRING PHYSICIAN:  Dr. Merlene Laughter. [place].   REASON FOR CONSULTATION:  Small-bowel obstruction.   HISTORY OF PRESENT ILLNESS:  I was asked to evaluate this 50 year old  African American female by Dr. Andi Devon in regards to possible bowel  obstruction.  The patient was admitted yesterday with a four-day history of  nausea, vomiting and diarrhea.  Workup revealed some pretty significant  dehydration.  She was admitted for IV fluids and NG tube was placed which  had a pretty significant output.  Abdominal x-rays showed some dilated loops  of small bowel consistent with a small-bowel obstruction.  She continued to  have some somewhat labile blood pressure, was undergoing volume  resuscitation and was transferred to the surgical intensive care unit to  continue with this treatment.  She also had the CT scan of the abdomen  pending.  She complains of some lower abdominal pain at this time and has  not had a further bowel movement today.  No other complaints.   PAST MEDICAL HISTORY:  1. Hypertension.  2. Asthmatic bronchitis.  3. GERD.  4. Fibroids.   PAST SURGICAL HISTORY:  Total abdominal hysterectomy last September with a  subsequent reexploration and vaginal cuff closure after she prolapsed some  small bowel through her vaginal cuff.   FAMILY HISTORY:  Her mother has colon cancer.   SOCIAL HISTORY:  She does not use tobacco or alcohol.   ALLERGIES:  ASPIRIN and IBUPROFEN.   MEDICATIONS:  Medications in the hospital include Phenergan, Tylenol,  Demerol, Zofran, Protonix,  Unasyn.   REVIEW OF SYSTEMS:  CARDIAC:  No complaints.  PULMONARY:  No complaints.  GI:  Please see the history of present illness.  MUSCULOSKELETAL:  No  complaints.  The remainder of the review of systems was not remarkable.   PHYSICAL EXAMINATION:  VITAL SIGNS:  Heart rate is 140; blood pressure  96/38; respiratory rate 26; temperature is 96.6.  GENERAL:  In general, she is awake and in no acute distress.  HEENT:  Her sclerae are nonicteric.  NECK:  Neck is supple.  LUNGS:  Lungs are clear to auscultation bilaterally.  HEART:  Heart is regular rate and rhythm and tachycardic.  ABDOMEN:  Her abdomen is somewhat distended.  She has very few bowel sounds.  She has some mild tenderness to deep palpation in the lower abdomen as well  as some scattered diffuse tenderness in her upper abdomen with no guarding  or rebound.  EXTREMITIES:  Extremities have no edema.   DATA REVIEWED:  Abdominal film yesterday showed some dilated small bowel  loops consistent with small-bowel obstruction.   CT scan of the abdomen is pending.   White blood cell count yesterday was 9.1, hemoglobin 16.3.  Electrolytes:  Sodium 136, potassium 4.4, chloride 106, CO2 19, BUN 26, creatinine 1,  glucose 122.  LFTs were within normal limits except for bilirubin was 2.1.   ASSESSMENT AND PLAN:  1. Likely small-bowel obstruction.  2. Hypovolemia.   Recommend the following:  1. Continue aggressive volume resuscitation.  2. Replace her nasogastric tube, which has been done during the time of my     consultation.  3. I agree with a CT scan of the abdomen and pelvis, which is now pending,     and I will follow up that scan and reevaluate the need for exploration as     the day progresses.   She, the patient, as of right now, is very reluctant to have any surgery and  hopefully we will be able to treat this conservatively.  I told her that the  need for an operation is a real possibility and will follow her  closely.   Thank you for this consult.                                                 Gabrielle Dare Janee Morn, M.D.    BET/MEDQ  D:  08/30/2002  T:  08/31/2002  Job:  [id number]

## 2010-07-10 NOTE — Op Note (Signed)
NAME:  [name], [name]                          ACCOUNT NO.:  [id number]   MEDICAL RECORD NO.:  [id number]                   PATIENT TYPE:  INP   LOCATION:  9325                                 FACILITY:  [facility]   PHYSICIAN:  Phil D. Okey Dupre, M.D.                  DATE OF BIRTH:  Oct 10, 1960   DATE OF PROCEDURE:  10/26/2001  DATE OF DISCHARGE:                                 OPERATIVE REPORT   PREOPERATIVE DIAGNOSIS:  Small bowel prolapsing into the vagina.   POSTOPERATIVE DIAGNOSIS:  1. Loop of ileus prolapsing through the vaginal cuff into the vagina.  2. Small abrasions of superficial ileal mucosa.   PROCEDURE:  1. Exploratory laparotomy.  2. Removal of small bowel from vagina by way of abdomen.  3. Closure of vaginal cuff.  4. Repair of mucosal abrasions on the ileum.   SURGEON:  Javier Glazier. Okey Dupre, M.D.   FINDINGS:  On entering the peritoneal cavity and reaching down into the area  of the vaginal cuff, a loop of ileum measuring approximately 6 inches where  the ileum joins the cecum was prolapsing through the vaginal cuff into the  vagina.  When this was removed there were two small abrasions noted on the  area that was in the vagina, each measuring about 2 cm in length just to the  superficial mucosa.  The bowel seemed not to have had any evidence of  devitalization and retained its normal color immediately.   DESCRIPTION OF PROCEDURE:  Under satisfactory general anesthesia which was  complicated from the patient had very difficult so a direct line was put  into the jugular before the start of the case.  Then under general  anesthesia, the staples and the Jackson-Pratt drain from the previous  surgery were removed.  A Foley catheter was inserted into the urinary  bladder, and the abdomen then prepped and draped in the usual sterile  manner.  The incision, two days old, which was a transverse incision, 3 cm  above the symphysis pubis, was opened by blunt and sharp dissection.  The  old sutures were cut with the Mayo scissors, and the peritoneal cavity was  entered.  The bowel was packed away, and the findings were as  aforementioned.  Small bowel loop being removed from the vagina was examined  for vitality and found to be fine.  The vaginal cuff was then closed with  three interrupted sutures of one chromic and the peritoneum after the  bladder flap was brought over the top of the cuff.  The patient had  extremely deep cul-de-sac above this.  The abdomen was then well irrigated  with normal saline and then with antibacterial irrigant with polymixin.  The  bowel was examined and found to be as aforementioned and two small areas of  abrasion were reapproximated with two interrupted 3-0 silk sutures in each  area.  Areas  were observed further for vitality and the bowel looked  healthy.  The large bowel was then put over the pelvic suture line, and the  peroneal cavity reirrigated.  The peritoneum was then closed with continuous  running 0 Vicryl on an atraumatic needle from either end of the incision  meeting at the midpoint, and the fascia was closed from either incision  meeting in the midpoint with the same type of suture.  Each layer was  irrigated with polymixin irrigant.  Subcutaneous approximation was carried  out with interrupted 0 Vicryl sutures, and the skin edges approximated with  skin staples.  A dry, sterile dressing was  applied.  The patient tolerated the procedure well with minimal blood loss  of less than 20 cc and was transferred to the recovery room in satisfactory  condition.  Nasogastric tube was placed prior to the patient being taken to  the recovery room.                                               Phil D. Okey Dupre, M.D.    PDR/MEDQ  D:  10/26/2001  T:  10/26/2001  Job:  [id number]

## 2010-07-10 NOTE — Op Note (Signed)
NAME:  [name], [name] NO.:  [id number]   MEDICAL RECORD NO.:  [id number]                   PATIENT TYPE:  INP   LOCATION:  2309                                 FACILITY:  [facility]   PHYSICIAN:  Gabrielle Dare. Janee Morn, M.D.             DATE OF BIRTH:  [birth year]   DATE OF PROCEDURE:  08/30/2002  DATE OF DISCHARGE:                                 OPERATIVE REPORT   PREOPERATIVE DIAGNOSIS:  Small bowel obstruction.   POSTOPERATIVE DIAGNOSIS:  Small bowel obstruction with strangulated bowel.   PROCEDURE:  Exploratory laparotomy, lysis of adhesions, small bowel  resection x2.   SURGEON:  Gabrielle Dare. Janee Morn, M.D.   ASSISTANT:  Ollen Gross. Carolynne Edouard, M.D.   HISTORY OF PRESENT ILLNESS:  The patient is a 50 year old African American  female  who was admitted yesterday with a 4 day history of nausea and  vomiting and diarrhea. She had some hypotension and hypovolemia. I was asked  to see her earlier today  in regards to her small bowel obstruction. She was  transferred to the intensive care unit. She got fluid resuscitation. I  placed a central line in her and she had  a CT scan of the abdomen which  demonstrated a high-grade small bowel obstruction and she was subsequently  brought to the operating room for exploration.   DESCRIPTION OF PROCEDURE:  Informed consent had been obtained. The patient  was receiving intravenous antibiotics. General anesthesia was administered.  Her abdomen was prepped and draped in a sterile fashion.   A lower midline incision was made from just below the umbilicus down to the  symphysis pubis. The subcutaneous tissues were dissection down to the  anterior fascia which was divided. The peritoneal cavity was then entered  under direct vision without difficulty. The fascia was opened to the length  of the incision revealing some mildly red ascites and several large dilated  loops of small bowel.   The small bowel was slowly mobilized lysing  a great number of adhesions  revealing  a strangulated segment of about 20 cm stuck down into the pelvis.  This was very difficult to mobilize, but gradually the adhesions were taken  down and this one very ischemic strangulated segment of ileum was brought up  out of the pelvis. Several more segments of small bowel were stuck down into  the pelvis. These were all mobilized very carefully in light of their very  distended nature. The serosa was quite friable on one loop which was brought  up out of the pelvis and some serosal tears were made, all concentrated in 1  area, but this was unavoidable in order to get  this up out of the pelvis.   Once all of the bowel was freed up after extensive adhesiolysis, we ran it  back from the terminal ileum back to the ligament of Treitz. Subsequently  the one 20 cm  segment of strangulated bowel was determined to require  resection as well as the area where the serosa was torn up after being so  stuck down in the pelvis, so we planned to make 2 segmental resections for  reanastomosing at this time.   Near the beginning of  the strangulated portion we made an enterotomy  and  inserted the pull sucker to decompress the bowel which was quite distended.  We milked back several liters of feculent small bowel contents into the  sucker and this was removed from the abdomen. In this process a few further  serosal tears were made in the friable distended small bowel. This did not  require much manipulation at all to create these. These were repaired with a  series of Lembert silk sutures.   Once that was accomplished and the small bowel was evacuated in a  satisfactory fashion, the small bowel was divided with a GIA 75. At the  proximal portion it was then divided with a GIA 75 distal  to the necrotic  portion and  the mesentery was  sequentially taken down with clamps divided  and securely ligated with good hemostasis.   At this point we then made a  side-to-side anastomosis using the GIA 75  stapler, and then the resulting enterotomy was closed with a series of  interrupted 3-0 silk sutures. The anastomosis was checked and the fluid from  inside the bowel moved nicely with a good caliber of the anastomosis and  there was no leakage noted. The rent in the mesentery was closed with a few  interrupted 3-0 silk sutures.   We then redirected our attention to the small segment of small bowel a  little bit more proximal which had been denuded of its serosa. This was  divided proximally  and distally with a GIA 75 stapler. The mesentery was  sequentially taken down with clamps and divided and securely ligated and  suture ligated as needed, and then we again performed a side-to-side  anastomosis with the GIA 75 stapler. The resulting enterotomy was closed  with a series of interrupted 3-0 silk sutures. This anastomosis was checked  and 1 further  suture was placed, yielding good closure with no leakage of  fluid when the fluid was milked by and there was a good caliber of the  anastomosis. The rent to the mesentery was closed down with a few  interrupted 3-0 silk sutures.   Subsequently the bowel was returned to the abdomen. The retractor was  removed. We copiously irrigated the peritoneal cavity with warm saline. Note  that the patient had some hemodynamic instability during the case and  required a large amount of volume resuscitation as well as Neo-Synephrine  and Dopamine were continued. She was holding her pressure, but had blood gas  with a base deficit of -9. Her coag panel was also very elevated and she was  receiving fresh frozen plasma towards the end of the case.   Once the abdomen was irrigated it was closed in the following fashion. First  we placed 3 #5 Ethibond retention sutures and left them loose. Then we  closed the fascia with running #1 PDS with 2 lengths, 1 for the top and 1 for the bottom of the incision. As we passed  the site over the retentions,  those were closed using a segment red rubber to bridge the wounds. Once the  fascia was closed, the subcutaneous tissues were irrigated and left open and  packed  with Kerlix and wet-to-dry distally which was sterile was placed.   The patient remained in critical condition.  She was returned to the SICU  remaining on the ventilator.                                               Gabrielle Dare Janee Morn, M.D.    BET/MEDQ  D:  08/30/2002  T:  08/31/2002  Job:  [id number]

## 2010-07-10 NOTE — Op Note (Signed)
[facility]  Patient:    IZABELLA, MARCANTEL Visit Number: [id number] MRN: [id number]          Service Type: DSU Location: [facility] Attending Physician:  Amada Kingfisher. Dictated by:   Kristen Loader, M.D. Proc. Date: 08/07/01 Admit Date:  08/07/2001                             Operative Report  PREOPERATIVE DIAGNOSES:       1. Uterine fibroids.                               2. Menometrorrhagia. POSTOPERATIVE DIAGNOSES:      1. Uterine fibroids.                               2. Menometrorrhagia.  OPERATION:                    Diagnostic hysteroscopy and dilatation and curettage.  SURGEON:                      Kristen Loader, M.D.  FINDINGS:                     The entire uterine cavity was easily visualized. No submucous fibroids were noted, although the cavity was large and sounded to a depth of 12 cm but was regular in configuration, perhaps would not be a bad choice to try an endometrial ablation.  DESCRIPTION OF PROCEDURE:     Under satisfactory general anesthesia with the patient in the dorsolithotomy position, the vagina and perineum were prepped and draped in the usual sterile manner.  Bimanual pelvic examination under anesthesia revealed a uterus of about the size of a 10 to 12-week pregnancy, irregular in configuration.  The adnexa could not be palpated because of the habitus of the patient.  A weighted speculum was placed in the posterior portion of the vagina through a marital introitus.  BUS was within normal limits.  The vagina was clean and well irrigated.  The cervix was parous and grasped with a single-tooth tenaculum.  The uterine cavity was sounded to 12 cm, the cervical os dilated to a #6 Hagar dilator.  A diagnostic hysteroscope was inserted into the uterine cavity to the fundus using normal saline as a dilating medium, and the findings were as aforementioned.  The scope was removed and the uterine cavity curetted briskly with sharp  followed by serrated, followed by sharp curet and found to be regular with no abnormalities noted and a minimal amount of endometrial tissue obtained and sent for pathologic diagnosis.  The tenaculum and speculum were removed from the vagina.  The patient was transferred to the recovery room in satisfactory condition after having tolerated the procedure well. Dictated by:   Kristen Loader, M.D. Attending Physician:  Amada Kingfisher. DD:  08/07/01 TD:  08/08/01 Job: [id number]

## 2010-07-10 NOTE — Op Note (Signed)
[name], [name]                ACCOUNT NO.:  [id number]   MEDICAL RECORD NO.:  [id number]          PATIENT TYPE:  AMB   LOCATION:  ENDO                         FACILITY:  [facility]   PHYSICIAN:  Anselmo Rod, M.D.  DATE OF BIRTH:  September 09, 1960   DATE OF PROCEDURE:  11/11/2004  DATE OF DISCHARGE:                                 OPERATIVE REPORT   PROCEDURE PERFORMED:  Colonoscopy.   ENDOSCOPIST:  Charna Elizabeth, M.D.   INSTRUMENT USED:  Olympus video colonoscope.   INDICATIONS FOR PROCEDURE:  Guaiac positive stool in a 50 year old [place]-  American female.  Rule out colonic polyps, masses, etc.   PREPROCEDURE PREPARATION:  Informed consent was procured from the patient.  The patient was fasted for eight hours prior to the procedure and prepped  with a bottle of magnesium citrate and a gallon of GoLYTELY the night prior  to the procedure.  The risks and benefits of the procedure including a 10%  miss rate for colon polyps or cancers was discussed with the patient as  well.   PREPROCEDURE PHYSICAL:  The patient had stable vital signs.  Neck supple.  Chest clear to auscultation.  S1 and S2 regular.  Abdomen soft with normal  bowel sounds.   DESCRIPTION OF PROCEDURE:  The patient was placed in left lateral decubitus  position and sedated with 75 mg of Demerol and 6 mg of Versed in slow  incremental doses.  Once the patient was adequately sedated and maintained  on low flow oxygen and continuous cardiac monitoring, the Olympus video  colonoscope was advanced from the rectum to the cecum.  There was a large  amount of residual stool in the colon.  Multiple washes were done.  Feces  were seen impacted in the appendix, question fecaliths.  Multiple washes  were done to mobilize this, but we were not successful in doing so.  No  masses, polyps, erosions, ulcerations or diverticula were seen.  Retroflexion in the rectum revealed no abnormality.   IMPRESSION:  1.  Unrevealing colonoscopy  up to the cecum except for a fecalith in the      appendicular orifice.  2.  No masses, polyps, no evidence of diverticulosis.  3.  Normal terminal ileum.  4.  Large amount of residual stool in the colon, small lesions could be      missed.   RECOMMENDATIONS:  1.  Repeat colonoscopy is recommended in the next 10 years unless the      patient develops any abnormal symptoms      in the interim.  2.  Outpatient followup as need arises in the future.  3.  Continue high fiber diet with liberal fluid intake.      Anselmo Rod, M.D.  Electronically Signed     JNM/MEDQ  D:  11/11/2004  T:  11/12/2004  Job:  [id number]   cc:   Merlene Laughter. Renae Gloss, M.D.  Fax: [phone]

## 2010-07-10 NOTE — Op Note (Signed)
NAME:  [name], [name]                          ACCOUNT NO.:  [id number]   MEDICAL RECORD NO.:  [id number]                   PATIENT TYPE:  INP   LOCATION:  9325                                 FACILITY:  [facility]   PHYSICIAN:  Phil D. Okey Dupre, M.D.                  DATE OF BIRTH:  [birth year]   DATE OF PROCEDURE:  10/24/2001  DATE OF DISCHARGE:                                 OPERATIVE REPORT   PROCEDURE:  Total abdominal hysterectomy and bilateral salpingo-  oophorectomy.   PREOPERATIVE DIAGNOSES:  Chronic pelvic pain, menorrhagia, complex ovarian  cyst.   POSTOPERATIVE DIAGNOSES:  Chronic pelvic pain, menorrhagia, bilateral  hydrosalpinx with pelvic adhesions.   SURGEON:  Javier Glazier. Okey Dupre, M.D.   FIRST ASSISTANT:  Conni Elliot, M.D.   OPERATIVE FINDINGS:  Uterus of about [redacted] weeks gestational size, somewhat  irregular, with a fibroid to the left of the uterus in the area of the  uterine vessels measuring about 3 cm in diameter.  The uterus was boggy in  consistency.  Both ovaries and tubes were plastered down to the pelvic wall  beneath the uterus, wrapped in filmy adhesions.  The procedure went as  follows.   PROCEDURE:  Under satisfactory general anesthesia with the patient in the  dorsal lithotomy position, the abdomen was prepped.  The vagina was prepped.  Attempt to put in a Foley catheter was thwarted by stenotic urethra which  had to be dilated up with a urethral dilator and a small 18 catheter used  for drainage.  Foley catheter was placed in the bladder and the patient was  then placed in the dorsal supine position and the abdomen draped in the  usual sterile manner.  The abdomen was then entered through a transverse low  abdominal Maillard incision situated 3 cm above the symphysis pubis and  extending for a total length of 16 cm.  The incision was carried down to the  fascia which was opened transversely by sharp dissection.  The rectus  muscles were separated in the mid  point and then transected by sharp  dissection.  1 chromic ties were used for the inferior hypogastric vessels  ligation and the peritoneum was then opened with sharp dissection  transversely.  An L4 retractor was placed in the abdomen, the bowel packed  away, and attention directed to the pelvis.  The findings were as  aforementioned.  A towel clamp was used to elevate the uterus and blunt  dissection used to free up the uterus from the cul-de-sac from the  adhesions.  Attempt was made to free up the ovaries and tubes which was  aforementioned but this could not be done at this point.  Dissection of  adhesions was done by sharp dissection, especially the area of the bladder.  Once this was done straight Kocher clamps were placed laterally to the  uterus immediately adjacent  to it to include the round ligament and the  utero-ovarian ligament and the fallopian tube.  The round ligaments were  then transected and divided, ligated with 1 chromic catgut suture.  The  anterior leafs of the broad ligament were opened and extended around to the  anterior superior portion of the cervix.  Openings made in avascular  portions of the broad ligament and free ties placed through that and tied  laterally around the pedicle medial to the ovary which was plastered against  the wall.  The bladder was then pushed away from the anterior surface of the  cervix by blunt dissection and the uterine vessels were doubly clamped with  Heaney clamps, divided, and doubly ligated with 1 chromic catgut suture  ligature.  The cardinal ligaments were then clamped with straight Kocher  clamps, divided, and ligated with 1 chromic catgut suture ligature.  At this  point the vagina was opened in the left lateral area and the cervix is  dissected away from the __________, thus removing the uterus and cervix.  Just before removing the cervix, however, the uterus was cut away from the  apex of the cervix to improve exposure.   Once the cervix was removed, the  cuff was run with a continuous running locked 1 chromic on an atraumatic  needle for hemostasis and at this point there was no bleeding in those  areas.  Attention was directed to the adnexa which were dissected free,  identifying the ureters below the ovary and once this was done Heaney clamps  placed across the infundibulopelvic ligaments.  The medial tissue was  excised and sent for pathological diagnosis.  The lateral pedicles ligated  with 1 chromic catgut suture ligatures.  The area seemed dry at the end of  the procedure.  No noted bleeders.  The tapes were removed from the abdomen.  Upper abdominal viscera was explored and found to be normal.  The peritoneum  was then closed with continuous running 0 chromic catgut suture on an  atraumatic needle.  Slight oozing below the fascia and above the peritoneum  was controlled with hot cautery.  Because of the dead space being left,  decided to use a Jackson-Pratt drain which was exited to the right and  slightly above the incision with number 10 flat.  At this point the fascia  was closed from either end of the incision meeting in the mid point with a  running alternating locked 0 Vicryl suture.  Subcutaneous bleeders were  controlled with hot cautery.  Skin edges approximated with skin staples.  The Jackson-Pratt drain was secured with a 3-0 silk suture.  Dry, sterile  dressing was applied.  The patient was transferred to the recovery room in  satisfactory condition and with a blood loss of approximately 300 cc.  She  tolerated this procedure well.  Tape, instrument, sponge, and needle count  reported correct before closure of the skin and the Foley catheter was  draining clear amber urine at the end of the procedure.                                               Phil D. Okey Dupre, M.D.    PDR/MEDQ  D:  10/24/2001  T:  10/24/2001  Job:  [id number]

## 2010-11-19 LAB — DIFFERENTIAL
Basophils Relative: 0
Eosinophils Relative: 0
Lymphocytes Relative: 17
Monocytes Relative: 9
Neutro Abs: 8.4 — ABNORMAL HIGH
Neutrophils Relative %: 74

## 2010-11-19 LAB — CBC
HCT: 33.3 — ABNORMAL LOW
HCT: 36.7
HCT: 41.1
Hemoglobin: 11.1 — ABNORMAL LOW
Hemoglobin: 12.3
MCHC: 32.7
MCHC: 33.3
MCHC: 33.4
MCHC: 34
MCV: 86.2
MCV: 86.4
Platelets: 183
Platelets: 184
Platelets: 209
RBC: 3.87
RDW: 13.5
RDW: 13.8
WBC: 4.8
WBC: 5.2

## 2010-11-19 LAB — URINALYSIS, ROUTINE W REFLEX MICROSCOPIC
Glucose, UA: NEGATIVE
Hgb urine dipstick: NEGATIVE
Protein, ur: NEGATIVE
Specific Gravity, Urine: 1.022

## 2010-11-19 LAB — COMPREHENSIVE METABOLIC PANEL
Albumin: 3.1 — ABNORMAL LOW
Albumin: 3.9
Alkaline Phosphatase: 77
BUN: 6
BUN: 6
Calcium: 9.1
Calcium: 9.7
Creatinine, Ser: 0.85
Glucose, Bld: 100 — ABNORMAL HIGH
Glucose, Bld: 93
Potassium: 3.4 — ABNORMAL LOW
Potassium: 3.5
Sodium: 138
Total Protein: 6.7
Total Protein: 7.8

## 2010-11-19 LAB — BASIC METABOLIC PANEL
BUN: 9
CO2: 27
CO2: 28
Calcium: 9.3
Chloride: 107
Chloride: 108
Creatinine, Ser: 0.79
GFR calc Af Amer: >60
GFR calc Af Amer: >60
Glucose, Bld: 100 — ABNORMAL HIGH
Potassium: 3.8

## 2010-11-19 LAB — TSH: TSH: 0.868

## 2010-11-19 LAB — URINE CULTURE

## 2011-06-03 DIAGNOSIS — J45909 Unspecified asthma, uncomplicated: Secondary | ICD-10-CM | POA: Diagnosis present

## 2011-06-03 DIAGNOSIS — Z8249 Family history of ischemic heart disease and other diseases of the circulatory system: Secondary | ICD-10-CM | POA: Insufficient documentation

## 2011-06-03 DIAGNOSIS — F32A Depression, unspecified: Secondary | ICD-10-CM | POA: Insufficient documentation

## 2012-03-25 HISTORY — PX: TOTAL KNEE ARTHROPLASTY: SHX125

## 2013-01-03 ENCOUNTER — Other Ambulatory Visit: Payer: Self-pay | Admitting: Nurse Practitioner

## 2013-01-03 DIAGNOSIS — Z1231 Encounter for screening mammogram for malignant neoplasm of breast: Secondary | ICD-10-CM

## 2013-02-01 ENCOUNTER — Ambulatory Visit
Admission: RE | Admit: 2013-02-01 | Discharge: 2013-02-01 | Disposition: A | Discharge status: Home / Self Care | Payer: Medicaid Other | Source: Ambulatory Visit | Attending: Nurse Practitioner | Admitting: Nurse Practitioner

## 2013-02-01 DIAGNOSIS — Z1231 Encounter for screening mammogram for malignant neoplasm of breast: Secondary | ICD-10-CM

## 2013-02-14 ENCOUNTER — Other Ambulatory Visit: Payer: Self-pay | Admitting: Nurse Practitioner

## 2013-02-14 DIAGNOSIS — R928 Other abnormal and inconclusive findings on diagnostic imaging of breast: Secondary | ICD-10-CM

## 2013-02-27 ENCOUNTER — Ambulatory Visit
Admission: RE | Admit: 2013-02-27 | Discharge: 2013-02-27 | Disposition: A | Discharge status: Home / Self Care | Payer: Medicaid Other | Source: Ambulatory Visit | Attending: Nurse Practitioner | Admitting: Nurse Practitioner

## 2013-02-27 DIAGNOSIS — R928 Other abnormal and inconclusive findings on diagnostic imaging of breast: Secondary | ICD-10-CM

## 2013-05-04 ENCOUNTER — Emergency Department (HOSPITAL_COMMUNITY)
Admission: EM | Admit: 2013-05-04 | Discharge: 2013-05-04 | Disposition: A | Discharge status: Home / Self Care | Payer: 59 | Source: Home / Self Care

## 2013-05-05 ENCOUNTER — Encounter (HOSPITAL_COMMUNITY): Payer: Self-pay | Admitting: Emergency Medicine

## 2013-05-05 ENCOUNTER — Emergency Department (INDEPENDENT_AMBULATORY_CARE_PROVIDER_SITE_OTHER)
Admission: EM | Admit: 2013-05-05 | Discharge: 2013-05-05 | Disposition: A | Discharge status: Home / Self Care | Payer: 59 | Source: Home / Self Care | Attending: Family Medicine | Admitting: Family Medicine

## 2013-05-05 DIAGNOSIS — J019 Acute sinusitis, unspecified: Secondary | ICD-10-CM

## 2013-05-05 HISTORY — DX: Chronic obstructive pulmonary disease, unspecified: J44.9

## 2013-05-05 HISTORY — DX: Gastro-esophageal reflux disease without esophagitis: K21.9

## 2013-05-05 MED ORDER — IPRATROPIUM BROMIDE 0.06 % NA SOLN: 2.0000 | Freq: Four times a day (QID) | NASAL | Status: DC

## 2013-05-05 MED ORDER — MINOCYCLINE HCL 100 MG PO CAPS: 100.0000 mg | ORAL_CAPSULE | Freq: Two times a day (BID) | ORAL | Status: DC

## 2013-05-05 NOTE — Discharge Instructions (Signed)
Drink plenty of fluids as discussed, use medicine as prescribed, and mucinex or delsym for cough. Return or see your doctor if further problems °

## 2013-05-05 NOTE — ED Provider Notes (Signed)
CSN: [id number]     Arrival date & time 05/05/13  1837 History   First MD Initiated Contact with Patient 05/05/13 1854     Chief Complaint  Patient presents with  . Cough   (Consider location/radiation/quality/duration/timing/severity/associated sxs/prior Treatment) Patient is a 53 y.o. female presenting with cough. The history is provided by the patient.  Cough Cough characteristics:  Non-productive and productive Sputum characteristics:  Green Severity:  Mild Onset quality:  Gradual Duration:  2 weeks Progression:  Unchanged Chronicity:  New Smoker: no   Context: weather changes   Relieved by:  None tried Worsened by:  Nothing tried Ineffective treatments:  None tried Associated symptoms: rhinorrhea, shortness of breath and sore throat   Associated symptoms: no fever and no wheezing     Past Medical History  Diagnosis Date  . COPD (chronic obstructive pulmonary disease)   . GERD (gastroesophageal reflux disease)    Past Surgical History  Procedure Laterality Date  . Abdominal hysterectomy    . Knee surgery    . Bowel resection     History reviewed. No pertinent family history. History  Substance Use Topics  . Smoking status: Never Smoker   . Smokeless tobacco: Not on file  . Alcohol Use: No   OB History   Grav Para Term Preterm Abortions TAB SAB Ect Mult Living                 Review of Systems  Constitutional: Negative.  Negative for fever.  HENT: Positive for congestion, postnasal drip, rhinorrhea and sore throat.   Respiratory: Positive for cough and shortness of breath. Negative for wheezing.   Cardiovascular: Negative.   Gastrointestinal: Negative.     Allergies  Corn-containing products; Peanuts; and Shellfish allergy  Home Medications   Current Outpatient Rx  Name  Route  Sig  Dispense  Refill  . Esomeprazole Magnesium (NEXIUM PO)   Oral   Take by mouth.         Marland Kitchen. LISINOPRIL PO   Oral   Take by mouth.         . Rosuvastatin Calcium  (CRESTOR PO)   Oral   Take by mouth.         Marland Kitchen. ipratropium (ATROVENT) 0.06 % nasal spray   Nasal   Place 2 sprays into the nose 4 (four) times daily.   15 mL   12   . minocycline (MINOCIN,DYNACIN) 100 MG capsule   Oral   Take 1 capsule (100 mg total) by mouth 2 (two) times daily.   20 capsule   0    BP 147/74  Pulse 83  Temp(Src) 98.9 F (37.2 C) (Oral)  Resp 16  SpO2 100% Physical Exam  Nursing note and vitals reviewed. Constitutional: She is oriented to person, place, and time. She appears well-developed and well-nourished.  HENT:  Head: Normocephalic.  Right Ear: External ear normal.  Left Ear: External ear normal.  Nose: Mucosal edema and rhinorrhea present.  Mouth/Throat: Oropharynx is clear and moist.  Neck: Normal range of motion. Neck supple.  Cardiovascular: Normal heart sounds.   Pulmonary/Chest: Effort normal and breath sounds normal.  Lymphadenopathy:    She has no cervical adenopathy.  Neurological: She is alert and oriented to person, place, and time.  Skin: Skin is warm and dry.    ED Course  Procedures (including critical care time) Labs Review Labs Reviewed - No data to display Imaging Review No results found.   MDM   1. Acute sinusitis  Linna Hoff, MD 05/05/13 [phone]

## 2013-05-05 NOTE — ED Notes (Signed)
Pt  Reports   Sinus  Drainage with  Green  Mucous  With  Symptoms  X   sev  Weeks                 Pt  Has  Asthma  And  Copd             She  Reports            sorethroat   And  Is  Hoarse  As  Well

## 2013-07-25 ENCOUNTER — Other Ambulatory Visit: Payer: Self-pay | Admitting: Nurse Practitioner

## 2013-07-25 DIAGNOSIS — R921 Mammographic calcification found on diagnostic imaging of breast: Secondary | ICD-10-CM

## 2013-08-29 ENCOUNTER — Ambulatory Visit
Admission: RE | Admit: 2013-08-29 | Discharge: 2013-08-29 | Disposition: A | Discharge status: Home / Self Care | Payer: Commercial Managed Care - HMO | Source: Ambulatory Visit | Attending: Nurse Practitioner | Admitting: Nurse Practitioner

## 2013-08-29 ENCOUNTER — Encounter (INDEPENDENT_AMBULATORY_CARE_PROVIDER_SITE_OTHER): Payer: Self-pay

## 2013-08-29 DIAGNOSIS — R921 Mammographic calcification found on diagnostic imaging of breast: Secondary | ICD-10-CM

## 2013-12-28 ENCOUNTER — Emergency Department (INDEPENDENT_AMBULATORY_CARE_PROVIDER_SITE_OTHER): Payer: Medicare HMO

## 2013-12-28 ENCOUNTER — Encounter (HOSPITAL_COMMUNITY): Payer: Self-pay

## 2013-12-28 ENCOUNTER — Emergency Department (INDEPENDENT_AMBULATORY_CARE_PROVIDER_SITE_OTHER)
Admission: EM | Admit: 2013-12-28 | Discharge: 2013-12-28 | Disposition: A | Discharge status: Home / Self Care | Payer: Medicare HMO | Source: Home / Self Care | Attending: Family Medicine | Admitting: Family Medicine

## 2013-12-28 DIAGNOSIS — M25561 Pain in right knee: Secondary | ICD-10-CM

## 2013-12-28 MED ORDER — TRAMADOL HCL 50 MG PO TABS: 50.0000 mg | ORAL_TABLET | Freq: Four times a day (QID) | ORAL | Status: DC | PRN

## 2013-12-28 NOTE — Discharge Instructions (Signed)
Wear knee support for comfort as needed, see your orthopedist next week for recheck.

## 2013-12-28 NOTE — ED Provider Notes (Signed)
CSN: [id number]     Arrival date & time 12/28/13  1324 History   First MD Initiated Contact with Patient 12/28/13 1411     Chief Complaint  Patient presents with  . Knee Pain   (Consider location/radiation/quality/duration/timing/severity/associated sxs/prior Treatment) Patient is a 53 y.o. female presenting with knee pain. The history is provided by the patient.  Knee Pain Location:  Knee Time since incident:  1 week Injury: no   Knee location:  R knee Pain details:    Quality:  Sharp   Severity:  Moderate   Onset quality:  Gradual   Progression:  Unchanged Chronicity:  New (h/o right tkr in [place], 1 yr ago, [facility].) Dislocation: no   Prior injury to area:  No Associated symptoms: decreased ROM and swelling   Associated symptoms: no back pain and no fever     Past Medical History  Diagnosis Date  . COPD (chronic obstructive pulmonary disease)   . GERD (gastroesophageal reflux disease)    Past Surgical History  Procedure Laterality Date  . Abdominal hysterectomy    . Knee surgery    . Bowel resection     History reviewed. No pertinent family history. History  Substance Use Topics  . Smoking status: Never Smoker   . Smokeless tobacco: Not on file  . Alcohol Use: No   OB History    No data available     Review of Systems  Constitutional: Negative.  Negative for fever.  Musculoskeletal: Positive for joint swelling and gait problem. Negative for back pain and arthralgias.  Skin: Negative.     Allergies  Corn-containing products; Peanuts; and Shellfish allergy  Home Medications   Prior to Admission medications   Medication Sig Start Date End Date Taking? Authorizing Provider  Fluticasone-Salmeterol (ADVAIR) 500-50 MCG/DOSE AEPB Inhale 1 puff into the lungs 2 (two) times daily.   Yes Historical Provider, MD  Esomeprazole Magnesium (NEXIUM PO) Take by mouth.    Historical Provider, MD  ipratropium (ATROVENT) 0.06 % nasal spray Place 2 sprays into the nose 4  (four) times daily. 05/05/13   Linna HoffJames D Kyllian Clingerman, MD  LISINOPRIL PO Take by mouth.    Historical Provider, MD  minocycline (MINOCIN,DYNACIN) 100 MG capsule Take 1 capsule (100 mg total) by mouth 2 (two) times daily. 05/05/13   Linna HoffJames D Shadara Lopez, MD  Rosuvastatin Calcium (CRESTOR PO) Take by mouth.    Historical Provider, MD  traMADol (ULTRAM) 50 MG tablet Take 1 tablet (50 mg total) by mouth every 6 (six) hours as needed for moderate pain. 12/28/13   Linna HoffJames D Shyra Emile, MD   BP 135/79 mmHg  Pulse 89  Temp(Src) 98.2 F (36.8 C) (Oral)  Resp 18  SpO2 98% Physical Exam  Constitutional: She is oriented to person, place, and time. She appears well-developed and well-nourished. She appears distressed.  Musculoskeletal: She exhibits tenderness.       Right knee: She exhibits decreased range of motion, swelling, deformity and bony tenderness. She exhibits no effusion, no ecchymosis, no erythema, normal alignment and no LCL laxity. Tenderness found. Medial joint line and lateral joint line tenderness noted.       Legs: Neurological: She is alert and oriented to person, place, and time.  Skin: Skin is warm and dry.  Nursing note and vitals reviewed.   ED Course  Procedures (including critical care time) Labs Review Labs Reviewed - No data to display  Imaging Review Dg Knee Complete 4 Views Right  12/28/2013   CLINICAL DATA:  53 year old female with right knee pain Min  EXAM: RIGHT KNEE - COMPLETE 4+ VIEW  COMPARISON:  Prior radiographs of the right knee 10/13/2009  FINDINGS: Interval surgical changes of total knee arthroplasty since the prior radiographs. No evidence of hardware complication. Alignment remains anatomic. Small to moderate knee joint effusion. Normal bony mineralization. No evidence of lytic or blastic osseous lesion.  IMPRESSION: 1. 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 2. Moderate knee joint effusion. Differential considerations include degenerative,  inflammatory and infectious etiologies.   Electronically Signed   By: Malachy MoanHeath  McCullough M.D.   On: 12/28/2013 14:38     MDM   1. Knee pain, acute, right        Linna HoffJames D Janalyn Higby, MD 12/28/13 1517

## 2013-12-28 NOTE — ED Notes (Signed)
[place] Pain x 1 week . No known injury

## 2014-01-30 ENCOUNTER — Encounter (HOSPITAL_COMMUNITY): Payer: Self-pay | Admitting: Emergency Medicine

## 2014-01-30 ENCOUNTER — Emergency Department (HOSPITAL_COMMUNITY)
Admission: EM | Admit: 2014-01-30 | Discharge: 2014-01-30 | Disposition: A | Discharge status: Home / Self Care | Payer: Medicaid Other | Attending: Emergency Medicine | Admitting: Emergency Medicine

## 2014-01-30 ENCOUNTER — Emergency Department (HOSPITAL_COMMUNITY): Payer: Medicaid Other

## 2014-01-30 DIAGNOSIS — M25561 Pain in right knee: Secondary | ICD-10-CM | POA: Diagnosis present

## 2014-01-30 DIAGNOSIS — Z79899 Other long term (current) drug therapy: Secondary | ICD-10-CM | POA: Diagnosis not present

## 2014-01-30 DIAGNOSIS — Z7951 Long term (current) use of inhaled steroids: Secondary | ICD-10-CM | POA: Insufficient documentation

## 2014-01-30 DIAGNOSIS — Z008 Encounter for other general examination: Secondary | ICD-10-CM | POA: Insufficient documentation

## 2014-01-30 DIAGNOSIS — J449 Chronic obstructive pulmonary disease, unspecified: Secondary | ICD-10-CM | POA: Diagnosis not present

## 2014-01-30 DIAGNOSIS — Z91040 Latex allergy status: Secondary | ICD-10-CM | POA: Insufficient documentation

## 2014-01-30 DIAGNOSIS — Z792 Long term (current) use of antibiotics: Secondary | ICD-10-CM | POA: Diagnosis not present

## 2014-01-30 DIAGNOSIS — M25461 Effusion, right knee: Secondary | ICD-10-CM | POA: Diagnosis not present

## 2014-01-30 DIAGNOSIS — K219 Gastro-esophageal reflux disease without esophagitis: Secondary | ICD-10-CM | POA: Diagnosis not present

## 2014-01-30 MED ORDER — OXYCODONE-ACETAMINOPHEN 5-325 MG PO TABS: 1.0000 | ORAL_TABLET | Freq: Four times a day (QID) | ORAL | Status: DC | PRN

## 2014-01-30 NOTE — ED Notes (Signed)
Hx of right knee sx last [place]. Has had recurring pain the 2 months. Pt thinks "it is getting fluid in it" now. Has not established Ortho in [place] as of yet.

## 2014-01-30 NOTE — ED Provider Notes (Signed)
CSN: [id number]     Arrival date & time 01/30/14  1002 History  This chart was scribed for non-physician practitioner, Roxy Horsemanobert Ahlana Slaydon, PA-C working with Vanetta MuldersScott Zackowski, MD by Greggory StallionKayla Andersen, ED scribe. This patient was seen in room TR05C/TR05C and the patient's care was started at 10:59 AM.   Chief Complaint  Patient presents with  . Knee Pain   The history is provided by the patient. No language interpreter was used.    HPI Comments: [name] is a 53 y.o. female with history of right knee replacement who presents to the Emergency Department complaining of intermittent right knee pain that started 2 months ago. Reports associated swelling that started last night. Denies fall or injury. She has used an ice pack and a knee brace with little relief. Pt had similar problems last year and was [place]oped[place] in [place]. States she recently moved here and [place]'t [place] in [place]. She has taken percocet in the past for her pain with relief.   Past Medical History  Diagnosis Date  . COPD (chronic obstructive pulmonary disease)   . GERD (gastroesophageal reflux disease)    Past Surgical History  Procedure Laterality Date  . Abdominal hysterectomy    . Knee surgery    . Bowel resection     No family history on file. History  Substance Use Topics  . Smoking status: Never Smoker   . Smokeless tobacco: Not on file  . Alcohol Use: No   OB History    No data available     Review of Systems  Musculoskeletal: Positive for joint swelling and arthralgias.  All other systems reviewed and are negative.  Allergies  Aspirin; Corn-containing products; Ibuprofen; Latex; Morphine and related; Peanuts; and Shellfish allergy  Home Medications   Prior to Admission medications   Medication Sig Start Date End Date Taking? Authorizing Provider  Esomeprazole Magnesium (NEXIUM PO) Take by mouth.    Historical Provider, MD  Fluticasone-Salmeterol (ADVAIR) 500-50  MCG/DOSE AEPB Inhale 1 puff into the lungs 2 (two) times daily.    Historical Provider, MD  ipratropium (ATROVENT) 0.06 % nasal spray Place 2 sprays into the nose 4 (four) times daily. 05/05/13   Linna HoffJames D Kindl, MD  LISINOPRIL PO Take by mouth.    Historical Provider, MD  minocycline (MINOCIN,DYNACIN) 100 MG capsule Take 1 capsule (100 mg total) by mouth 2 (two) times daily. 05/05/13   Linna HoffJames D Kindl, MD  Rosuvastatin Calcium (CRESTOR PO) Take by mouth.    Historical Provider, MD  traMADol (ULTRAM) 50 MG tablet Take 1 tablet (50 mg total) by mouth every 6 (six) hours as needed for moderate pain. 12/28/13   Linna HoffJames D Kindl, MD   BP 147/82 mmHg  Pulse 100  Temp(Src) 98.1 F (36.7 C) (Oral)  Resp 18  SpO2 98%   Physical Exam  Constitutional: She is oriented to person, place, and time. She appears well-developed and well-nourished. No distress.  HENT:  Head: Normocephalic and atraumatic.  Eyes: Conjunctivae and EOM are normal.  Cardiovascular: Normal rate and regular rhythm.   Pulmonary/Chest: Effort normal and breath sounds normal. No stridor. No respiratory distress.  Abdominal: She exhibits no distension.  Musculoskeletal: She exhibits no edema.  Right knee mildly swollen. No focal tenderness. No bony abnormality or deformity. ROM and strength 5/5. Pt is ambulatory. No signs of DVT or septic joint.   Neurological: She is alert and oriented to person, place, and time. No cranial nerve  deficit.  Skin: Skin is warm and dry.  No erythema. No signs of cellulitis.  Psychiatric: She has a normal mood and affect.  Nursing note and vitals reviewed.   ED Course  Procedures (including critical care time)  DIAGNOSTIC STUDIES: Oxygen Saturation is 98% on RA, normal by my interpretation.    COORDINATION OF CARE: 11:04 AM-Discussed treatment plan which includes ACE bandage, ice and percocet with pt at bedside and pt agreed to plan. Will give pt an orthopedic referral and advised her to follow up.    Labs Review Labs Reviewed - No data to display  Imaging Review Dg Knee Complete 4 Views Right  01/30/2014   CLINICAL DATA:  53 year old female with right knee swelling for 2 days. No known injury. Initial encounter.  EXAM: RIGHT KNEE - COMPLETE 4+ VIEW  COMPARISON:  12/28/2013.  FINDINGS: Sequelae of right total knee arthroplasty re - identified. Continued joint effusion, appears progressed in the suprapatellar joint space since November. Hardware components appear stable and intact. Alignment is stable. No acute fracture or dislocation identified. Stable bone mineralization.  IMPRESSION: Progressed joint effusion since November. Otherwise stable sequelae of right total knee arthroplasty.   Electronically Signed   By: Augusto GambleLee  Hall M.D.   On: 01/30/2014 10:55     EKG Interpretation None      MDM   Final diagnoses:  Encounter for medical assessment  Knee effusion, right    Patient right knee effusion.  No injuries, hx of TKA, no fever, no signs of dvt, septic joint.  Patient ambulates.  Ortho follow-up.  I personally performed the services described in this documentation, which was scribed in my presence. The recorded information has been reviewed and is accurate.  Roxy Horsemanobert Tatijana Bierly, PA-C 01/30/14 1109  Vanetta MuldersScott Zackowski, MD 01/31/14 [phone]

## 2014-01-30 NOTE — Discharge Instructions (Signed)

## 2014-03-05 ENCOUNTER — Encounter: Payer: Self-pay | Admitting: Gastroenterology

## 2014-03-14 ENCOUNTER — Other Ambulatory Visit: Payer: Self-pay | Admitting: Nurse Practitioner

## 2014-03-14 DIAGNOSIS — R921 Mammographic calcification found on diagnostic imaging of breast: Secondary | ICD-10-CM

## 2014-03-21 ENCOUNTER — Inpatient Hospital Stay
Admission: RE | Admit: 2014-03-21 | Discharge: 2014-03-21 | Disposition: A | Discharge status: Home / Self Care | Payer: Commercial Managed Care - HMO | Source: Ambulatory Visit | Attending: Nurse Practitioner | Admitting: Nurse Practitioner

## 2014-03-21 ENCOUNTER — Encounter (HOSPITAL_COMMUNITY): Payer: Self-pay | Admitting: Emergency Medicine

## 2014-03-21 ENCOUNTER — Inpatient Hospital Stay (HOSPITAL_COMMUNITY)
Admission: EM | Admit: 2014-03-21 | Discharge: 2014-03-27 | DRG: 390 | Disposition: A | Discharge status: Home / Self Care | Payer: Medicare HMO | Attending: Internal Medicine | Admitting: Internal Medicine

## 2014-03-21 DIAGNOSIS — K56600 Partial intestinal obstruction, unspecified as to cause: Secondary | ICD-10-CM

## 2014-03-21 DIAGNOSIS — R1084 Generalized abdominal pain: Secondary | ICD-10-CM

## 2014-03-21 DIAGNOSIS — J449 Chronic obstructive pulmonary disease, unspecified: Secondary | ICD-10-CM | POA: Diagnosis present

## 2014-03-21 DIAGNOSIS — Z9049 Acquired absence of other specified parts of digestive tract: Secondary | ICD-10-CM | POA: Diagnosis present

## 2014-03-21 DIAGNOSIS — Z96651 Presence of right artificial knee joint: Secondary | ICD-10-CM | POA: Diagnosis present

## 2014-03-21 DIAGNOSIS — I10 Essential (primary) hypertension: Secondary | ICD-10-CM | POA: Diagnosis present

## 2014-03-21 DIAGNOSIS — Z91040 Latex allergy status: Secondary | ICD-10-CM

## 2014-03-21 DIAGNOSIS — Z886 Allergy status to analgesic agent status: Secondary | ICD-10-CM

## 2014-03-21 DIAGNOSIS — H269 Unspecified cataract: Secondary | ICD-10-CM | POA: Diagnosis present

## 2014-03-21 DIAGNOSIS — K56609 Unspecified intestinal obstruction, unspecified as to partial versus complete obstruction: Secondary | ICD-10-CM

## 2014-03-21 DIAGNOSIS — Z885 Allergy status to narcotic agent status: Secondary | ICD-10-CM

## 2014-03-21 DIAGNOSIS — Z978 Presence of other specified devices: Secondary | ICD-10-CM

## 2014-03-21 DIAGNOSIS — Z90710 Acquired absence of both cervix and uterus: Secondary | ICD-10-CM

## 2014-03-21 DIAGNOSIS — Z79891 Long term (current) use of opiate analgesic: Secondary | ICD-10-CM

## 2014-03-21 DIAGNOSIS — Z888 Allergy status to other drugs, medicaments and biological substances status: Secondary | ICD-10-CM

## 2014-03-21 DIAGNOSIS — R197 Diarrhea, unspecified: Secondary | ICD-10-CM

## 2014-03-21 DIAGNOSIS — E785 Hyperlipidemia, unspecified: Secondary | ICD-10-CM | POA: Diagnosis present

## 2014-03-21 DIAGNOSIS — K219 Gastro-esophageal reflux disease without esophagitis: Secondary | ICD-10-CM | POA: Diagnosis present

## 2014-03-21 DIAGNOSIS — Z91013 Allergy to seafood: Secondary | ICD-10-CM

## 2014-03-21 DIAGNOSIS — M1990 Unspecified osteoarthritis, unspecified site: Secondary | ICD-10-CM | POA: Diagnosis present

## 2014-03-21 DIAGNOSIS — R109 Unspecified abdominal pain: Secondary | ICD-10-CM | POA: Diagnosis present

## 2014-03-21 DIAGNOSIS — R112 Nausea with vomiting, unspecified: Secondary | ICD-10-CM

## 2014-03-21 DIAGNOSIS — Z4659 Encounter for fitting and adjustment of other gastrointestinal appliance and device: Secondary | ICD-10-CM

## 2014-03-21 DIAGNOSIS — G8929 Other chronic pain: Secondary | ICD-10-CM | POA: Diagnosis present

## 2014-03-21 HISTORY — DX: Essential (primary) hypertension: I10

## 2014-03-21 HISTORY — DX: Unspecified cataract: H26.9

## 2014-03-21 HISTORY — DX: Unspecified osteoarthritis, unspecified site: M19.90

## 2014-03-21 HISTORY — DX: Hyperlipidemia, unspecified: E78.5

## 2014-03-21 LAB — CBC WITH DIFFERENTIAL/PLATELET
BASOS PCT: 0 % (ref 0–1)
Basophils Absolute: 0 10*3/uL (ref 0.0–0.1)
Eosinophils Absolute: 0.1 10*3/uL (ref 0.0–0.7)
Eosinophils Relative: 1 % (ref 0–5)
HEMATOCRIT: 41.3 % (ref 36.0–46.0)
Hemoglobin: 13.6 g/dL (ref 12.0–15.0)
LYMPHS ABS: 2.5 10*3/uL (ref 0.7–4.0)
LYMPHS PCT: 42 % (ref 12–46)
MCH: 28.4 pg (ref 26.0–34.0)
MCHC: 32.9 g/dL (ref 30.0–36.0)
MCV: 86.2 fL (ref 78.0–100.0)
MONO ABS: 0.4 10*3/uL (ref 0.1–1.0)
Monocytes Relative: 7 % (ref 3–12)
NEUTROS ABS: 2.9 10*3/uL (ref 1.7–7.7)
Neutrophils Relative %: 50 % (ref 43–77)
Platelets: 242 10*3/uL (ref 150–400)
RBC: 4.79 MIL/uL (ref 3.87–5.11)
RDW: 14.2 % (ref 11.5–15.5)
WBC: 5.8 10*3/uL (ref 4.0–10.5)

## 2014-03-21 LAB — URINALYSIS, ROUTINE W REFLEX MICROSCOPIC
Bilirubin Urine: NEGATIVE
GLUCOSE, UA: NEGATIVE mg/dL
HGB URINE DIPSTICK: NEGATIVE
Ketones, ur: NEGATIVE mg/dL
Leukocytes, UA: NEGATIVE
NITRITE: NEGATIVE
PROTEIN: NEGATIVE mg/dL
Specific Gravity, Urine: 1.008 (ref 1.005–1.030)
UROBILINOGEN UA: 1 mg/dL (ref 0.0–1.0)
pH: 7 (ref 5.0–8.0)

## 2014-03-21 LAB — COMPREHENSIVE METABOLIC PANEL
ALBUMIN: 4.3 g/dL (ref 3.5–5.2)
ALT: 16 U/L (ref 0–35)
ANION GAP: 8 (ref 5–15)
AST: 28 U/L (ref 0–37)
Alkaline Phosphatase: 77 U/L (ref 39–117)
BUN: 6 mg/dL (ref 6–23)
CHLORIDE: 101 mmol/L (ref 96–112)
CO2: 29 mmol/L (ref 19–32)
Calcium: 10 mg/dL (ref 8.4–10.5)
Creatinine, Ser: 0.7 mg/dL (ref 0.50–1.10)
Glucose, Bld: 94 mg/dL (ref 70–99)
POTASSIUM: 4.4 mmol/L (ref 3.5–5.1)
SODIUM: 138 mmol/L (ref 135–145)
Total Bilirubin: 0.6 mg/dL (ref 0.3–1.2)
Total Protein: 7.6 g/dL (ref 6.0–8.3)

## 2014-03-21 LAB — LIPASE, BLOOD: Lipase: 26 U/L (ref 11–59)

## 2014-03-21 MED ORDER — ONDANSETRON HCL 4 MG/2ML IJ SOLN
4.0000 mg | Freq: Once | INTRAMUSCULAR | Status: AC
  Administered 2014-03-21: 4 mg via INTRAVENOUS
  Filled 2014-03-21: qty 2

## 2014-03-21 MED ORDER — DIPHENHYDRAMINE HCL 50 MG/ML IJ SOLN
25.0000 mg | Freq: Once | INTRAMUSCULAR | Status: AC
  Administered 2014-03-21: 25 mg via INTRAVENOUS
  Filled 2014-03-21: qty 1

## 2014-03-21 MED ORDER — SODIUM CHLORIDE 0.9 % IV BOLUS (SEPSIS)
1000.0000 mL | Freq: Once | INTRAVENOUS | Status: AC
  Administered 2014-03-21: 1000 mL via INTRAVENOUS

## 2014-03-21 MED ORDER — ONDANSETRON HCL 4 MG/2ML IJ SOLN: 4.0000 mg | Freq: Once | INTRAMUSCULAR | Status: DC

## 2014-03-21 MED ORDER — MORPHINE SULFATE 4 MG/ML IJ SOLN: 4.0000 mg | Freq: Once | INTRAMUSCULAR | Status: DC

## 2014-03-21 MED ORDER — HYDROMORPHONE HCL 1 MG/ML IJ SOLN
1.0000 mg | Freq: Once | INTRAMUSCULAR | Status: AC
Start: 2014-03-21 — End: 2014-03-21
  Administered 2014-03-21: 1 mg via INTRAVENOUS
  Filled 2014-03-21: qty 1

## 2014-03-21 NOTE — ED Notes (Signed)
Pt c/o upper abd pain and nausea x 2 days

## 2014-03-21 NOTE — ED Notes (Signed)
Patient reports not wanting to have IV, but is willing to try. Patient is a hard stick. Patient agrees to try since she has not had any fluids.

## 2014-03-21 NOTE — ED Notes (Signed)
Patient had slight reaction to zofran. Patient stated burning sensation. Area above IV turned red with itching. IV was flushed with 10cc of NS and PA informed.  After administering benadryl IV patient stated itching subsided. Redness has abated.

## 2014-03-21 NOTE — ED Provider Notes (Signed)
CSN: [id number]     Arrival date & time 03/21/14  1825 History   First MD Initiated Contact with Patient 03/21/14 1947     Chief Complaint  Patient presents with  . Abdominal Pain  . Nausea     (Consider location/radiation/quality/duration/timing/severity/associated sxs/prior Treatment) HPI   Patient presents with 4 days of abdominal pain, N/V/D.  Pain that is crampy, intermittent, with occasional sharp pain. She has vomited 7 times since Tuesday and 6 episodes of diarrhea daily.  Denies blood in emesis or diarrhea.  Diarrhea is watery.  Pain is 7/10 intensity.  States she is not tolerating PO.  Pt also notes significant anxiety as her mother is dying of cancer and she is feeling that her siblings are not dealing with it and she is taking care of both her mother and her siblings.    Past Medical History  Diagnosis Date  . COPD (chronic obstructive pulmonary disease)   . GERD (gastroesophageal reflux disease)    Past Surgical History  Procedure Laterality Date  . Abdominal hysterectomy    . Knee surgery    . Bowel resection     History reviewed. No pertinent family history. History  Substance Use Topics  . Smoking status: Never Smoker   . Smokeless tobacco: Not on file  . Alcohol Use: No   OB History    No data available     Review of Systems  All other systems reviewed and are negative.     Allergies  Aspirin; Corn-containing products; Ibuprofen; Latex; Morphine and related; Peanuts; and Shellfish allergy  Home Medications   Prior to Admission medications   Medication Sig Start Date End Date Taking? Authorizing Provider  Esomeprazole Magnesium (NEXIUM PO) Take by mouth.    Historical Provider, MD  Fluticasone-Salmeterol (ADVAIR) 500-50 MCG/DOSE AEPB Inhale 1 puff into the lungs 2 (two) times daily.    Historical Provider, MD  ipratropium (ATROVENT) 0.06 % nasal spray Place 2 sprays into the nose 4 (four) times daily. 05/05/13   Linna HoffJames D Kindl, MD  LISINOPRIL PO Take  by mouth.    Historical Provider, MD  minocycline (MINOCIN,DYNACIN) 100 MG capsule Take 1 capsule (100 mg total) by mouth 2 (two) times daily. 05/05/13   Linna HoffJames D Kindl, MD  oxyCODONE-acetaminophen (PERCOCET/ROXICET) 5-325 MG per tablet Take 1-2 tablets by mouth every 6 (six) hours as needed for severe pain. 01/30/14   Roxy Horsemanobert Browning, PA-C  Rosuvastatin Calcium (CRESTOR PO) Take by mouth.    Historical Provider, MD  traMADol (ULTRAM) 50 MG tablet Take 1 tablet (50 mg total) by mouth every 6 (six) hours as needed for moderate pain. 12/28/13   Linna HoffJames D Kindl, MD   BP 141/82 mmHg  Pulse 94  Temp(Src) 97.6 F (36.4 C) (Oral)  Resp 16  Ht 5\' 2"  (1.575 m)  Wt 174 lb (78.926 kg)  BMI 31.82 kg/m2  SpO2 98% Physical Exam  Constitutional: She appears well-developed and well-nourished. No distress.  HENT:  Head: Normocephalic and atraumatic.  Neck: Neck supple.  Cardiovascular: Normal rate and regular rhythm.   Pulmonary/Chest: Effort normal and breath sounds normal. No respiratory distress. She has no wheezes. She has no rales.  Abdominal: Soft. She exhibits no distension. There is generalized tenderness. There is no rebound and no guarding.  Neurological: She is alert.  Skin: She is not diaphoretic.  Nursing note and vitals reviewed.   ED Course  Procedures (including critical care time) Labs Review Labs Reviewed  CBC WITH DIFFERENTIAL/PLATELET  COMPREHENSIVE  METABOLIC PANEL  LIPASE, BLOOD  URINALYSIS, ROUTINE W REFLEX MICROSCOPIC    Imaging Review Ct Abdomen Pelvis W Contrast  03/22/2014   CLINICAL DATA:  Acute onset of generalized abdominal pain. Initial encounter.  EXAM: CT ABDOMEN AND PELVIS WITH CONTRAST  TECHNIQUE: Multidetector CT imaging of the abdomen and pelvis was performed using the standard protocol following bolus administration of intravenous contrast.  CONTRAST:  OMNIPAQUE IOHEXOL 300 MG/ML  SOLN  COMPARISON:  CT of the abdomen and pelvis performed 08/13/2007   FINDINGS: Focal bronchiectasis is noted within the right lower lobe.  The liver and spleen are unremarkable in appearance. Stones are seen dependently within the gallbladder; the gallbladder is otherwise unremarkable. The pancreas and adrenal glands are unremarkable.  The kidneys are unremarkable in appearance. There is no evidence of hydronephrosis. No renal or ureteral stones are seen. No perinephric stranding is appreciated.  There is marked dilatation of an unusual small bowel segment along the right side of the abdomen, measuring up to 10.0 cm. This is of uncertain significance, and may reflect prior surgery, with an apparent bowel suture line partially imaged. Contrast does not pass across this segment, possibly reflecting significant associated dysmotility. Just proximal to this segment, there is mild focal wall thickening along the proximal ileum. There is relative decompression of more distal small bowel loops, though a small amount of fluid is still seen within the majority of these loops. An underlying adhesion causing partial small-bowel obstruction cannot be excluded. The distal ileum is decompressed, to the level of the ileocecal junction.  No free fluid is identified. The stomach is within normal limits. No acute vascular abnormalities are seen. Minimal calcification is seen along the common iliac arteries bilaterally. There is new prominence of mesenteric nodes, measuring up to 1.2 cm in short axis.  Mild scarring is noted along the anterior abdominal wall, reflecting prior surgery.  The patient may be status post appendectomy, as a small focus of increased attenuation is noted at the cecum. There is no evidence of appendicitis. The colon is unremarkable in appearance. There is apparent wall thickening along the distal rectum; this may simply reflect chronic inflammation or intraluminal contents, though depending on the patient's symptoms, sigmoidoscopy could be considered for further evaluation.   The bladder is mildly distended and grossly unremarkable. The patient is status post hysterectomy. No suspicious adnexal masses are seen. No inguinal lymphadenopathy is seen.  No acute osseous abnormalities are identified.  IMPRESSION: 1. Marked dilatation of an unusual small bowel segment along the right side of the abdomen, measuring up to 10.0 cm. Dilatation is new from the prior study. Some of this may reflect prior surgery, with an apparent associated bowel suture line. Contrast does not pass across this segment, possibly reflecting significant associated dysmotility. There is mild focal wall thickening along the proximal ileum just proximal to this segment, and relative decompression of more distal small bowel loops, though a small amount of fluid is still seen within the majority of the distal loops. Cannot exclude underlying adhesion, causing partial small bowel obstruction. 2. New prominence of mesenteric nodes, measuring up to 1.2 cm in short axis. An underlying mass cannot be excluded, but is not well assessed on this study. If the patient's symptoms persist, PET/CT could be considered for further evaluation. 3. Cholelithiasis; gallbladder otherwise unremarkable. 4. Apparent wall thickening along the distal rectum. This may simply reflect chronic inflammation or intraluminal contents, though depending on the patient's symptoms, sigmoidoscopy could be considered for further evaluation.  5. Focal bronchiectasis at the right lower lung lobe.   Electronically Signed   By: Roanna Raider M.D.   On: 03/22/2014 00:50     EKG Interpretation None       9:59 PM Discussed pt with Dr Romeo Apple.  Patient reexamined.  Pt notes tenderness is worse on left.  Continues to have diffuse tenderness, improved from prior exam.   1:24 AM Admitted to Dr Selena Batten, [facility].    MDM   Final diagnoses:  Generalized abdominal pain  Nausea vomiting and diarrhea  Partial small bowel obstruction   Afebrile nontoxic  patient with abdominal pain, N/V/D x 3 days.  Diffuse tenderness on exam.  IVF, morphine, zofran given.  Labs, urine all normal.  Pt with significant tenderness.  CT abd/pelvis with multiple abnormal findings including large dilitation of small bowel segment, thought to reflect partial small bowel obstruction, also with new mesenteric nodes and distal rectum wall thickening.  Discussed results and plan with patient who agrees to admission.  She has [facility] appt for colonoscopy scheduled in March and PCP appointment scheduled for three days from now NP [place] Scrape, [facility].  Admitted to [facility], Dr Selena Batten accepting.      Trixie Dredge, PA-C 03/22/14 0127  Purvis Sheffield, MD 03/23/14 1245

## 2014-03-21 NOTE — ED Notes (Signed)
Called CT to make aware patient has finished contrast.

## 2014-03-22 ENCOUNTER — Encounter (HOSPITAL_COMMUNITY): Payer: Self-pay

## 2014-03-22 ENCOUNTER — Inpatient Hospital Stay (HOSPITAL_COMMUNITY): Payer: Medicare HMO

## 2014-03-22 ENCOUNTER — Emergency Department (HOSPITAL_COMMUNITY): Payer: Medicare HMO

## 2014-03-22 DIAGNOSIS — J42 Unspecified chronic bronchitis: Secondary | ICD-10-CM | POA: Diagnosis not present

## 2014-03-22 DIAGNOSIS — E785 Hyperlipidemia, unspecified: Secondary | ICD-10-CM | POA: Diagnosis present

## 2014-03-22 DIAGNOSIS — Z9049 Acquired absence of other specified parts of digestive tract: Secondary | ICD-10-CM | POA: Diagnosis present

## 2014-03-22 DIAGNOSIS — H269 Unspecified cataract: Secondary | ICD-10-CM | POA: Diagnosis present

## 2014-03-22 DIAGNOSIS — Z96651 Presence of right artificial knee joint: Secondary | ICD-10-CM | POA: Diagnosis present

## 2014-03-22 DIAGNOSIS — R1084 Generalized abdominal pain: Secondary | ICD-10-CM

## 2014-03-22 DIAGNOSIS — K5669 Other intestinal obstruction: Secondary | ICD-10-CM | POA: Diagnosis not present

## 2014-03-22 DIAGNOSIS — Z79891 Long term (current) use of opiate analgesic: Secondary | ICD-10-CM | POA: Diagnosis not present

## 2014-03-22 DIAGNOSIS — M1990 Unspecified osteoarthritis, unspecified site: Secondary | ICD-10-CM | POA: Diagnosis present

## 2014-03-22 DIAGNOSIS — Z91013 Allergy to seafood: Secondary | ICD-10-CM | POA: Diagnosis not present

## 2014-03-22 DIAGNOSIS — J449 Chronic obstructive pulmonary disease, unspecified: Secondary | ICD-10-CM | POA: Diagnosis present

## 2014-03-22 DIAGNOSIS — Z886 Allergy status to analgesic agent status: Secondary | ICD-10-CM | POA: Diagnosis not present

## 2014-03-22 DIAGNOSIS — Z91040 Latex allergy status: Secondary | ICD-10-CM | POA: Diagnosis not present

## 2014-03-22 DIAGNOSIS — G8929 Other chronic pain: Secondary | ICD-10-CM | POA: Diagnosis present

## 2014-03-22 DIAGNOSIS — R109 Unspecified abdominal pain: Secondary | ICD-10-CM | POA: Diagnosis present

## 2014-03-22 DIAGNOSIS — I10 Essential (primary) hypertension: Secondary | ICD-10-CM | POA: Diagnosis present

## 2014-03-22 DIAGNOSIS — K210 Gastro-esophageal reflux disease with esophagitis: Secondary | ICD-10-CM

## 2014-03-22 DIAGNOSIS — Z885 Allergy status to narcotic agent status: Secondary | ICD-10-CM | POA: Diagnosis not present

## 2014-03-22 DIAGNOSIS — K219 Gastro-esophageal reflux disease without esophagitis: Secondary | ICD-10-CM | POA: Diagnosis not present

## 2014-03-22 DIAGNOSIS — K56600 Partial intestinal obstruction, unspecified as to cause: Secondary | ICD-10-CM | POA: Diagnosis present

## 2014-03-22 DIAGNOSIS — Z888 Allergy status to other drugs, medicaments and biological substances status: Secondary | ICD-10-CM | POA: Diagnosis not present

## 2014-03-22 DIAGNOSIS — Z90710 Acquired absence of both cervix and uterus: Secondary | ICD-10-CM | POA: Diagnosis not present

## 2014-03-22 LAB — MRSA PCR SCREENING: MRSA BY PCR: NEGATIVE

## 2014-03-22 MED ORDER — LORAZEPAM 2 MG/ML IJ SOLN: 2.0000 mg | Freq: Once | INTRAMUSCULAR | Status: DC

## 2014-03-22 MED ORDER — ZOLPIDEM TARTRATE 5 MG PO TABS
5.0000 mg | ORAL_TABLET | Freq: Every evening | ORAL | Status: DC | PRN
  Administered 2014-03-25 – 2014-03-27 (×2): 5 mg via ORAL
  Filled 2014-03-22 (×2): qty 1

## 2014-03-22 MED ORDER — ALBUTEROL SULFATE (2.5 MG/3ML) 0.083% IN NEBU: 3.0000 mL | INHALATION_SOLUTION | Freq: Four times a day (QID) | RESPIRATORY_TRACT | Status: DC | PRN

## 2014-03-22 MED ORDER — IOHEXOL 300 MG/ML  SOLN
100.0000 mL | Freq: Once | INTRAMUSCULAR | Status: AC | PRN
  Administered 2014-03-22: 100 mL via INTRAVENOUS

## 2014-03-22 MED ORDER — LORAZEPAM 2 MG/ML IJ SOLN: 0.5000 mg | Freq: Four times a day (QID) | INTRAMUSCULAR | Status: DC | PRN

## 2014-03-22 MED ORDER — SODIUM CHLORIDE 0.9 % IJ SOLN
3.0000 mL | Freq: Two times a day (BID) | INTRAMUSCULAR | Status: DC
  Administered 2014-03-22 – 2014-03-27 (×9): 3 mL via INTRAVENOUS

## 2014-03-22 MED ORDER — HYDRALAZINE HCL 20 MG/ML IJ SOLN: 10.0000 mg | Freq: Four times a day (QID) | INTRAMUSCULAR | Status: DC | PRN

## 2014-03-22 MED ORDER — LORAZEPAM 0.5 MG PO TABS
0.5000 mg | ORAL_TABLET | Freq: Four times a day (QID) | ORAL | Status: DC | PRN
  Administered 2014-03-22 (×2): 0.5 mg via ORAL
  Filled 2014-03-22 (×2): qty 1

## 2014-03-22 MED ORDER — PANTOPRAZOLE SODIUM 40 MG IV SOLR
40.0000 mg | Freq: Two times a day (BID) | INTRAVENOUS | Status: DC
  Administered 2014-03-22 – 2014-03-24 (×6): 40 mg via INTRAVENOUS
  Filled 2014-03-22 (×9): qty 40

## 2014-03-22 MED ORDER — IPRATROPIUM BROMIDE 0.06 % NA SOLN
2.0000 | Freq: Four times a day (QID) | NASAL | Status: DC
  Administered 2014-03-22 – 2014-03-27 (×18): 2 via NASAL
  Filled 2014-03-22 (×2): qty 15

## 2014-03-22 MED ORDER — AMLODIPINE BESYLATE 10 MG PO TABS
10.0000 mg | ORAL_TABLET | Freq: Every day | ORAL | Status: DC
  Administered 2014-03-22: 10 mg via ORAL
  Filled 2014-03-22: qty 1

## 2014-03-22 MED ORDER — TIOTROPIUM BROMIDE MONOHYDRATE 18 MCG IN CAPS
18.0000 ug | ORAL_CAPSULE | Freq: Every day | RESPIRATORY_TRACT | Status: DC
  Administered 2014-03-22 – 2014-03-26 (×5): 18 ug via RESPIRATORY_TRACT
  Filled 2014-03-22: qty 5

## 2014-03-22 MED ORDER — HYDROMORPHONE HCL 1 MG/ML IJ SOLN: 1.0000 mg | INTRAMUSCULAR | Status: DC | PRN

## 2014-03-22 MED ORDER — OXYCODONE-ACETAMINOPHEN 5-325 MG PO TABS
1.0000 | ORAL_TABLET | Freq: Four times a day (QID) | ORAL | Status: DC | PRN
  Administered 2014-03-22 – 2014-03-26 (×3): 2 via ORAL
  Filled 2014-03-22 (×4): qty 2

## 2014-03-22 MED ORDER — HEPARIN SODIUM (PORCINE) 5000 UNIT/ML IJ SOLN
5000.0000 [IU] | Freq: Three times a day (TID) | INTRAMUSCULAR | Status: DC
  Administered 2014-03-22 – 2014-03-27 (×15): 5000 [IU] via SUBCUTANEOUS
  Filled 2014-03-22 (×17): qty 1

## 2014-03-22 MED ORDER — ACETAMINOPHEN 650 MG RE SUPP
650.0000 mg | Freq: Four times a day (QID) | RECTAL | Status: DC | PRN
Start: 2014-03-22 — End: 2014-03-27

## 2014-03-22 MED ORDER — ROSUVASTATIN CALCIUM 10 MG PO TABS
10.0000 mg | ORAL_TABLET | Freq: Every day | ORAL | Status: DC
  Administered 2014-03-22: 10 mg via ORAL
  Filled 2014-03-22: qty 1

## 2014-03-22 MED ORDER — HYDROMORPHONE HCL 1 MG/ML IJ SOLN
1.0000 mg | INTRAMUSCULAR | Status: DC | PRN
  Administered 2014-03-22 – 2014-03-25 (×6): 1 mg via INTRAVENOUS
  Filled 2014-03-22 (×6): qty 1

## 2014-03-22 MED ORDER — BISACODYL 10 MG RE SUPP
10.0000 mg | Freq: Once | RECTAL | Status: DC
  Filled 2014-03-22: qty 1

## 2014-03-22 MED ORDER — ACETAMINOPHEN 325 MG PO TABS: 650.0000 mg | ORAL_TABLET | Freq: Four times a day (QID) | ORAL | Status: DC | PRN

## 2014-03-22 MED ORDER — ONDANSETRON HCL 4 MG/2ML IJ SOLN: 4.0000 mg | Freq: Three times a day (TID) | INTRAMUSCULAR | Status: AC | PRN

## 2014-03-22 MED ORDER — SODIUM CHLORIDE 0.9 % IV SOLN
INTRAVENOUS | Status: AC
  Administered 2014-03-22: 02:00:00 via INTRAVENOUS

## 2014-03-22 MED ORDER — SODIUM CHLORIDE 0.9 % IV SOLN
INTRAVENOUS | Status: AC
  Administered 2014-03-22 – 2014-03-23 (×3): via INTRAVENOUS
  Administered 2014-03-23: 1 mL via INTRAVENOUS

## 2014-03-22 MED ORDER — SERTRALINE HCL 100 MG PO TABS
100.0000 mg | ORAL_TABLET | Freq: Every day | ORAL | Status: DC
Start: 2014-03-22 — End: 2014-03-27
  Administered 2014-03-22 – 2014-03-27 (×6): 100 mg via ORAL
  Filled 2014-03-22 (×6): qty 1

## 2014-03-22 MED ORDER — PROMETHAZINE HCL 25 MG/ML IJ SOLN
6.2500 mg | Freq: Four times a day (QID) | INTRAMUSCULAR | Status: DC | PRN
  Administered 2014-03-22 – 2014-03-26 (×11): 6.25 mg via INTRAVENOUS
  Filled 2014-03-22 (×11): qty 1

## 2014-03-22 MED ORDER — LISINOPRIL 20 MG PO TABS
20.0000 mg | ORAL_TABLET | Freq: Every day | ORAL | Status: DC
Start: 2014-03-22 — End: 2014-03-22
  Administered 2014-03-22: 20 mg via ORAL
  Filled 2014-03-22: qty 1

## 2014-03-22 MED ORDER — PANTOPRAZOLE SODIUM 40 MG PO TBEC
40.0000 mg | DELAYED_RELEASE_TABLET | Freq: Every day | ORAL | Status: DC
  Administered 2014-03-22: 40 mg via ORAL
  Filled 2014-03-22: qty 1

## 2014-03-22 MED ORDER — MOMETASONE FURO-FORMOTEROL FUM 200-5 MCG/ACT IN AERO
2.0000 | INHALATION_SPRAY | Freq: Two times a day (BID) | RESPIRATORY_TRACT | Status: DC
  Administered 2014-03-22 – 2014-03-26 (×10): 2 via RESPIRATORY_TRACT
  Filled 2014-03-22: qty 8.8

## 2014-03-22 MED ORDER — ENOXAPARIN SODIUM 40 MG/0.4ML ~~LOC~~ SOLN
40.0000 mg | SUBCUTANEOUS | Status: DC
  Administered 2014-03-22: 40 mg via SUBCUTANEOUS
  Filled 2014-03-22: qty 0.4

## 2014-03-22 NOTE — Progress Notes (Signed)
Per report from ED RN and patient's request, NGT to wait to be placed until after surgeon sees patient in the morning. Will continue to monitor. Troy SineWalker, Shona Pardo M

## 2014-03-22 NOTE — Progress Notes (Signed)
Patient arrived from ED via stretcher. Patient alert, oriented and ambulatory. Admission weight, vitals and assessment completed. Patient anxious and tearful about her mother's current health status and requested chaplain services.  Order placed.  Fall and safety plan reviewed with patient. Call light within reach. Patient currently resting comfortably, will continue to monitor. Blood pressure 113/69, pulse 83, temperature 97.8 F (36.6 C), temperature source Oral, resp. rate 20, height 5\' 2"  (1.575 m), weight 91.5 kg (201 lb 11.5 oz), SpO2 99 %. Troy SineWalker, Angelia Hazell M

## 2014-03-22 NOTE — H&P (Addendum)
[name] is an 54 y.o. female.    [name] (pcp)  Chief Complaint: abdominal pain,  HPI: 54 yo female with hx of sbo, hysterectomy, Copd, apparently c/o n/v , abdominal pain, periumbilical,  "sharp pain" .  The pain has been going on since Tuesday.  Pt notes slight diarrhea, liquid water.  Pt denies fever, chills, brbpr, black stool.  Pt came to ED and was found to have bowel dilation and possible partial SBO.    Past Medical History  Diagnosis Date  . COPD (chronic obstructive pulmonary disease)   . Hypertension   . GERD (gastroesophageal reflux disease)   . Osteoarthritis   . Cataract     Past Surgical History  Procedure Laterality Date  . Abdominal hysterectomy    . Knee surgery    . Bowel resection    . Total knee arthroplasty Right   . Hernia repair  20112    Family History  Problem Relation Age of Onset  . Cancer Mother   . Heart attack Father    Social History:  reports that she has never smoked. She does not have any smokeless tobacco history on file. She reports that she does not drink alcohol. Her drug history is not on file.  Allergies:  Allergies  Allergen Reactions  . Aspirin   . Corn-Containing Products   . Ibuprofen   . Latex   . Morphine And Related   . Peanuts [Peanut Oil]   . Shellfish Allergy   . Zofran [Ondansetron Hcl] Itching     (Not in a hospital admission)  Results for orders placed or performed during the hospital encounter of 03/21/14 (from the past 48 hour(s))  Urinalysis, Routine w reflex microscopic     Status: None   Collection Time: 03/21/14  7:01 PM  Result Value Ref Range   Color, Urine YELLOW YELLOW   APPearance CLEAR CLEAR   Specific Gravity, Urine 1.008 1.005 - 1.030   pH 7.0 5.0 - 8.0   Glucose, UA NEGATIVE NEGATIVE mg/dL   Hgb urine dipstick NEGATIVE NEGATIVE   Bilirubin Urine NEGATIVE NEGATIVE   Ketones, ur NEGATIVE NEGATIVE mg/dL   Protein, ur NEGATIVE NEGATIVE mg/dL   Urobilinogen, UA 1.0 0.0 - 1.0  mg/dL   Nitrite NEGATIVE NEGATIVE   Leukocytes, UA NEGATIVE NEGATIVE    Comment: MICROSCOPIC NOT DONE ON URINES WITH NEGATIVE PROTEIN, BLOOD, LEUKOCYTES, NITRITE, OR GLUCOSE <1000 mg/dL.  CBC with Differential     Status: None   Collection Time: 03/21/14  7:08 PM  Result Value Ref Range   WBC 5.8 4.0 - 10.5 K/uL   RBC 4.79 3.87 - 5.11 MIL/uL   Hemoglobin 13.6 12.0 - 15.0 g/dL   HCT 41.3 36.0 - 46.0 %   MCV 86.2 78.0 - 100.0 fL   MCH 28.4 26.0 - 34.0 pg   MCHC 32.9 30.0 - 36.0 g/dL   RDW 14.2 11.5 - 15.5 %   Platelets 242 150 - 400 K/uL   Neutrophils Relative % 50 43 - 77 %   Neutro Abs 2.9 1.7 - 7.7 K/uL   Lymphocytes Relative 42 12 - 46 %   Lymphs Abs 2.5 0.7 - 4.0 K/uL   Monocytes Relative 7 3 - 12 %   Monocytes Absolute 0.4 0.1 - 1.0 K/uL   Eosinophils Relative 1 0 - 5 %   Eosinophils Absolute 0.1 0.0 - 0.7 K/uL   Basophils Relative 0 0 - 1 %   Basophils Absolute 0.0 0.0 -  0.1 K/uL  Comprehensive metabolic panel     Status: None   Collection Time: 03/21/14  7:08 PM  Result Value Ref Range   Sodium 138 135 - 145 mmol/L   Potassium 4.4 3.5 - 5.1 mmol/L   Chloride 101 96 - 112 mmol/L   CO2 29 19 - 32 mmol/L   Glucose, Bld 94 70 - 99 mg/dL   BUN 6 6 - 23 mg/dL   Creatinine, Ser 0.70 0.50 - 1.10 mg/dL   Calcium 10.0 8.4 - 10.5 mg/dL   Total Protein 7.6 6.0 - 8.3 g/dL   Albumin 4.3 3.5 - 5.2 g/dL   AST 28 0 - 37 U/L   ALT 16 0 - 35 U/L   Alkaline Phosphatase 77 39 - 117 U/L   Total Bilirubin 0.6 0.3 - 1.2 mg/dL   GFR calc non Af Amer >90 >90 mL/min   GFR calc Af Amer >90 >90 mL/min    Comment: (NOTE) The eGFR has been calculated using the CKD EPI equation. This calculation has not been validated in all clinical situations. eGFR's persistently <90 mL/min signify possible Chronic Kidney Disease.    Anion gap 8 5 - 15  Lipase, blood     Status: None   Collection Time: 03/21/14  7:08 PM  Result Value Ref Range   Lipase 26 11 - 59 U/L   Ct Abdomen Pelvis W  Contrast  03/22/2014   CLINICAL DATA:  Acute onset of generalized abdominal pain. Initial encounter.  EXAM: CT ABDOMEN AND PELVIS WITH CONTRAST  TECHNIQUE: Multidetector CT imaging of the abdomen and pelvis was performed using the standard protocol following bolus administration of intravenous contrast.  CONTRAST:  138mL OMNIPAQUE IOHEXOL 300 MG/ML  SOLN  COMPARISON:  CT of the abdomen and pelvis performed 08/13/2007  FINDINGS: Focal bronchiectasis is noted within the right lower lobe.  The liver and spleen are unremarkable in appearance. Stones are seen dependently within the gallbladder; the gallbladder is otherwise unremarkable. The pancreas and adrenal glands are unremarkable.  The kidneys are unremarkable in appearance. There is no evidence of hydronephrosis. No renal or ureteral stones are seen. No perinephric stranding is appreciated.  There is marked dilatation of an unusual small bowel segment along the right side of the abdomen, measuring up to 10.0 cm. This is of uncertain significance, and may reflect prior surgery, with an apparent bowel suture line partially imaged. Contrast does not pass across this segment, possibly reflecting significant associated dysmotility. Just proximal to this segment, there is mild focal wall thickening along the proximal ileum. There is relative decompression of more distal small bowel loops, though a small amount of fluid is still seen within the majority of these loops. An underlying adhesion causing partial small-bowel obstruction cannot be excluded. The distal ileum is decompressed, to the level of the ileocecal junction.  No free fluid is identified. The stomach is within normal limits. No acute vascular abnormalities are seen. Minimal calcification is seen along the common iliac arteries bilaterally. There is new prominence of mesenteric nodes, measuring up to 1.2 cm in short axis.  Mild scarring is noted along the anterior abdominal wall, reflecting prior surgery.   The patient may be status post appendectomy, as a small focus of increased attenuation is noted at the cecum. There is no evidence of appendicitis. The colon is unremarkable in appearance. There is apparent wall thickening along the distal rectum; this may simply reflect chronic inflammation or intraluminal contents, though depending on the patient's symptoms,  sigmoidoscopy could be considered for further evaluation.  The bladder is mildly distended and grossly unremarkable. The patient is status post hysterectomy. No suspicious adnexal masses are seen. No inguinal lymphadenopathy is seen.  No acute osseous abnormalities are identified.  IMPRESSION: 1. Marked dilatation of an unusual small bowel segment along the right side of the abdomen, measuring up to 10.0 cm. Dilatation is new from the prior study. Some of this may reflect prior surgery, with an apparent associated bowel suture line. Contrast does not pass across this segment, possibly reflecting significant associated dysmotility. There is mild focal wall thickening along the proximal ileum just proximal to this segment, and relative decompression of more distal small bowel loops, though a small amount of fluid is still seen within the majority of the distal loops. Cannot exclude underlying adhesion, causing partial small bowel obstruction. 2. New prominence of mesenteric nodes, measuring up to 1.2 cm in short axis. An underlying mass cannot be excluded, but is not well assessed on this study. If the patient's symptoms persist, PET/CT could be considered for further evaluation. 3. Cholelithiasis; gallbladder otherwise unremarkable. 4. Apparent wall thickening along the distal rectum. This may simply reflect chronic inflammation or intraluminal contents, though depending on the patient's symptoms, sigmoidoscopy could be considered for further evaluation. 5. Focal bronchiectasis at the right lower lung lobe.   Electronically Signed   By: Garald Balding M.D.    On: 03/22/2014 00:50    Review of Systems  Constitutional: Negative for fever, chills, weight loss, malaise/fatigue and diaphoresis.  HENT: Negative for congestion, ear discharge, ear pain, hearing loss, nosebleeds, sore throat and tinnitus.   Eyes: Negative for blurred vision, double vision, photophobia, pain, discharge and redness.  Respiratory: Negative for cough, hemoptysis, sputum production, shortness of breath, wheezing and stridor.   Cardiovascular: Negative for chest pain, palpitations, orthopnea, claudication, leg swelling and PND.  Gastrointestinal: Positive for nausea, vomiting, abdominal pain, diarrhea and constipation. Negative for heartburn, blood in stool and melena.  Genitourinary: Negative for dysuria, urgency, frequency, hematuria and flank pain.  Musculoskeletal: Negative for myalgias, back pain, joint pain, falls and neck pain.  Skin: Negative for itching and rash.  Neurological: Negative for dizziness, tingling, tremors, sensory change, speech change, focal weakness, seizures, loss of consciousness, weakness and headaches.  Endo/Heme/Allergies: Negative for environmental allergies and polydipsia. Does not bruise/bleed easily.  Psychiatric/Behavioral: Negative for depression, suicidal ideas, hallucinations, memory loss and substance abuse. The patient is not nervous/anxious and does not have insomnia.     Blood pressure 112/54, pulse 85, temperature 97.6 F (36.4 C), temperature source Oral, resp. rate 16, height $RemoveBe'5\' 2"'[id number]$  (1.575 m), weight 78.926 kg (174 lb), SpO2 100 %. Physical Exam  Constitutional: She is oriented to person, place, and time. She appears well-developed and well-nourished.  HENT:  Head: Normocephalic and atraumatic.  Eyes: Conjunctivae and EOM are normal. Pupils are equal, round, and reactive to light.  Neck: Normal range of motion. Neck supple. No JVD present. No tracheal deviation present. No thyromegaly present.  Cardiovascular: Normal rate and regular  rhythm.  Exam reveals no gallop and no friction rub.   No murmur heard. Respiratory: Effort normal and breath sounds normal. No respiratory distress. She has no wheezes. She has no rales.  GI: Soft. Bowel sounds are normal. She exhibits no distension. There is no tenderness. There is no rebound and no guarding.  Musculoskeletal: Normal range of motion. She exhibits no edema or tenderness.  Lymphadenopathy:    She has no cervical adenopathy.  Neurological: She is alert and oriented to person, place, and time. She has normal reflexes. She displays normal reflexes. No cranial nerve deficit. She exhibits normal muscle tone. Coordination normal.  Skin: Skin is warm and dry. No rash noted. No erythema. No pallor.  Psychiatric: She has a normal mood and affect. Her behavior is normal. Judgment and thought content normal.     Assessment/Plan Partial SBO Please consult surgery in am Ns iv NPO NGT to low intermittent suction  Gerd Nexium=> protonix due to formulary  Copd: Cont advair, spiriva, albuterol  Hypertension:  Cont current tx  Hyperlipidemia:  Cont crestor  DVT prophylaxis, scd, lovenox  Jennika Ringgold 03/22/2014, 1:48 AM

## 2014-03-22 NOTE — Progress Notes (Signed)
Patient ID: [name]  female  ZOX:[id number]    DOB: [birth year]    DOA: 03/21/2014  PCP: Diamantina Providence., FNP  Brief history of present illness and  Patient is a 54 year old female with history of recurrent SBO, hysterectomy, COPD, hypertension, hyperlipidemia, GERD presented with nausea, vomiting, abdominal pain for last 2 days prior to admission. Patient had slight diarrhea, liquidy stools. Otherwise denied any fevers, chills or rectal bleeding. Patient was admitted for partial SBO.   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 - CT of the abdomen and pelvis in ED showed marked dilatation of small bowel segment along the right side of the abdomen up to 10 cm, new from the prior study. New mesenteric nodes measuring up to 1.2 cm in short axis, an underlying mass cannot be excluded recommended PET CT, cholelithiasis otherwise gallbladder unremarkable, apparent wall thickening along the distal rectum. - Continue nothing by mouth status, called surgery consult, will follow recommendations.  Active Problems:   COPD (chronic obstructive pulmonary disease) - Currently stable, no wheezing, continue albuterol, Spiriva, dulera    Hypertension - DC oral meds, surgery likely to place NG tube, placed on IV hydralazine as needed    GERD (gastroesophageal reflux disease) - Placed on IV PPI    Hyperlipidemia - Hold statins    Osteoarthritis - Continue IV pain medications as needed  DVT Prophylaxis: heparin sq  Code Status: FC  Family Communication:  Disposition:  Consultants:  surgery  Procedures:  CT abd pelvis  Antibiotics:  None    Subjective: Abd pain, but no nausea, vomiting. Flatus+, no BM   Objective: Weight change:   Intake/Output Summary (Last 24 hours) at 03/22/14  1124 Last data filed at 03/22/14 1055  Gross per 24 hour  Intake      0 ml  Output    950 ml  Net   -950 ml   Blood pressure 108/60, pulse 67, temperature 97.6 F (36.4 C), temperature source Oral, resp. rate 20, height  (1.575 m), weight 91.5 kg (201 lb 11.5 oz), SpO2 98 %.  Physical Exam: General: Alert and awake, oriented x3, not in any acute distress. HEENT: anicteric sclera, PERLA, EOMI CVS: S1-S2 clear, no murmur rubs or gallops Chest: clear to auscultation bilaterally, no wheezing, rales or rhonchi Abdomen: soft with minimal tenderness in mid abdomen, bs+  Extremities: no cyanosis, clubbing or edema noted bilaterally Neuro: Cranial nerves II-XII intact, no focal neurological deficits  Lab Results: Basic Metabolic Panel:  Recent Labs Lab 03/21/14 1908  NA 138  K 4.4  CL 101  CO2 29  GLUCOSE 94  BUN 6  CREATININE 0.70  CALCIUM 10.0   Liver Function Tests:  Recent Labs Lab 03/21/14 1908  AST 28  ALT 16  ALKPHOS 77  BILITOT 0.6  PROT 7.6  ALBUMIN 4.3    Recent Labs Lab 03/21/14 1908  LIPASE 26   No results for input(s): AMMONIA in the last 168 hours. CBC:  Recent Labs Lab 03/21/14 1908  WBC 5.8  NEUTROABS 2.9  HGB 13.6  HCT 41.3  MCV 86.2  PLT 242   Cardiac Enzymes: No results for input(s): CKTOTAL, CKMB, CKMBINDEX, TROPONINI in the last 168 hours. BNP: Invalid input(s): POCBNP CBG: No results for input(s): GLUCAP in the last 168 hours.   Micro Results: Recent Results (from the  past 240 hour(s))  MRSA PCR Screening     Status: None   Collection Time: 03/22/14  3:24 AM  Result Value Ref Range Status   MRSA by PCR NEGATIVE NEGATIVE Final    Comment:        The GeneXpert MRSA Assay (FDA approved for NASAL specimens only), is one component of a comprehensive MRSA colonization surveillance program. It is not intended to diagnose MRSA infection nor to guide or monitor treatment for MRSA infections.     Studies/Results: Ct  Abdomen Pelvis W Contrast  03/22/2014   CLINICAL DATA:  Acute onset of generalized abdominal pain. Initial encounter.  EXAM: CT ABDOMEN AND PELVIS WITH CONTRAST  TECHNIQUE: Multidetector CT imaging of the abdomen and pelvis was performed using the standard protocol following bolus administration of intravenous contrast.  CONTRAST:  100mL OMNIPAQUE IOHEXOL 300 MG/ML  SOLN  COMPARISON:  CT of the abdomen and pelvis performed 08/13/2007  FINDINGS: Focal bronchiectasis is noted within the right lower lobe.  The liver and spleen are unremarkable in appearance. Stones are seen dependently within the gallbladder; the gallbladder is otherwise unremarkable. The pancreas and adrenal glands are unremarkable.  The kidneys are unremarkable in appearance. There is no evidence of hydronephrosis. No renal or ureteral stones are seen. No perinephric stranding is appreciated.  There is marked dilatation of an unusual small bowel segment along the right side of the abdomen, measuring up to 10.0 cm. This is of uncertain significance, and may reflect prior surgery, with an apparent bowel suture line partially imaged. Contrast does not pass across this segment, possibly reflecting significant associated dysmotility. Just proximal to this segment, there is mild focal wall thickening along the proximal ileum. There is relative decompression of more distal small bowel loops, though a small amount of fluid is still seen within the majority of these loops. An underlying adhesion causing partial small-bowel obstruction cannot be excluded. The distal ileum is decompressed, to the level of the ileocecal junction.  No free fluid is identified. The stomach is within normal limits. No acute vascular abnormalities are seen. Minimal calcification is seen along the common iliac arteries bilaterally. There is new prominence of mesenteric nodes, measuring up to 1.2 cm in short axis.  Mild scarring is noted along the anterior abdominal wall, reflecting  prior surgery.  The patient may be status post appendectomy, as a small focus of increased attenuation is noted at the cecum. There is no evidence of appendicitis. The colon is unremarkable in appearance. There is apparent wall thickening along the distal rectum; this may simply reflect chronic inflammation or intraluminal contents, though depending on the patient's symptoms, sigmoidoscopy could be considered for further evaluation.  The bladder is mildly distended and grossly unremarkable. The patient is status post hysterectomy. No suspicious adnexal masses are seen. No inguinal lymphadenopathy is seen.  No acute osseous abnormalities are identified.  IMPRESSION: 1. Marked dilatation of an unusual small bowel segment along the right side of the abdomen, measuring up to 10.0 cm. Dilatation is new from the prior study. Some of this may reflect prior surgery, with an apparent associated bowel suture line. Contrast does not pass across this segment, possibly reflecting significant associated dysmotility. There is mild focal wall thickening along the proximal ileum just proximal to this segment, and relative decompression of more distal small bowel loops, though a small amount of fluid is still seen within the majority of the distal loops. Cannot exclude underlying adhesion, causing partial small bowel obstruction. 2. New prominence  of mesenteric nodes, measuring up to 1.2 cm in short axis. An underlying mass cannot be excluded, but is not well assessed on this study. If the patient's symptoms persist, PET/CT could be considered for further evaluation. 3. Cholelithiasis; gallbladder otherwise unremarkable. 4. Apparent wall thickening along the distal rectum. This may simply reflect chronic inflammation or intraluminal contents, though depending on the patient's symptoms, sigmoidoscopy could be considered for further evaluation. 5. Focal bronchiectasis at the right lower lung lobe.   Electronically Signed   By: Roanna Raider M.D.   On: 03/22/2014 00:50    Medications: Scheduled Meds: . sodium chloride   Intravenous STAT  . amLODipine  10 mg Oral Daily  . enoxaparin (LOVENOX) injection  40 mg Subcutaneous Q24H  . ipratropium  2 spray Nasal QID  . lisinopril  20 mg Oral Daily  . LORazepam  2 mg Intravenous Once  . mometasone-formoterol  2 puff Inhalation BID  . pantoprazole  40 mg Oral Daily  . rosuvastatin  10 mg Oral Daily  . sertraline  100 mg Oral Daily  . sodium chloride  3 mL Intravenous Q12H  . tiotropium  18 mcg Inhalation Daily   Time spent 25 minutes   LOS: 1 day   RAI,RIPUDEEP M.D. [facility] 03/22/2014, 11:24 AM Pager: [phone]  If 7PM-7AM, please contact night-coverage [email or web address] Password [email or web address]

## 2014-03-22 NOTE — ED Notes (Signed)
Patient transported to CT 

## 2014-03-22 NOTE — Discharge Instructions (Signed)
Read the information below.  Use the prescribed medication as directed.  Please discuss all new medications with your pharmacist.  Do not take additional tylenol while taking the prescribed pain medication to avoid overdose.  You may return to the Emergency Department at any time for worsening condition or any new symptoms that concern you.  If you develop high fevers, worsening abdominal pain, uncontrolled vomiting, or are unable to tolerate fluids by mouth, return to the ER for a recheck.   ° °Abdominal Pain °Many things can cause abdominal pain. Usually, abdominal pain is not caused by a disease and will improve without treatment. It can often be observed and treated at home. Your health care provider will do a physical exam and possibly order blood tests and X-rays to help determine the seriousness of your pain. However, in many cases, more time must pass before a clear cause of the pain can be found. Before that point, your health care provider may not know if you need more testing or further treatment. °HOME CARE INSTRUCTIONS  °Monitor your abdominal pain for any changes. The following actions may help to alleviate any discomfort you are experiencing: °· Only take over-the-counter or prescription medicines as directed by your health care provider. °· Do not take laxatives unless directed to do so by your health care provider. °· Try a clear liquid diet (broth, tea, or water) as directed by your health care provider. Slowly move to a bland diet as tolerated. °SEEK MEDICAL CARE IF: °· You have unexplained abdominal pain. °· You have abdominal pain associated with nausea or diarrhea. °· You have pain when you urinate or have a bowel movement. °· You experience abdominal pain that wakes you in the night. °· You have abdominal pain that is worsened or improved by eating food. °· You have abdominal pain that is worsened with eating fatty foods. °· You have a fever. °SEEK IMMEDIATE MEDICAL CARE IF:  °· Your pain does  not go away within 2 hours. °· You keep throwing up (vomiting). °· Your pain is felt only in portions of the abdomen, such as the right side or the left lower portion of the abdomen. °· You pass bloody or black tarry stools. °MAKE SURE YOU: °· Understand these instructions.   °· Will watch your condition.   °· Will get help right away if you are not doing well or get worse.   °Document Released: 11/18/2004 Document Revised: 02/13/2013 Document Reviewed: 10/18/2012 °[employer]® Patient Information ©2015 [employer]. This information is not intended to replace advice given to you by your health care provider. Make sure you discuss any questions you have with your health care provider. ° °

## 2014-03-22 NOTE — Progress Notes (Signed)
Chaplain attempted follow-up.  Patient did not feel like talking, but would like follow up later. Lillie FragminAnn Elmore Chaplain 03/22/2014

## 2014-03-22 NOTE — Care Management Note (Unsigned)
    Page 1 of 1   03/25/2014     3:17:16 PM CARE MANAGEMENT NOTE 03/25/2014  Patient:  Margo AyeSLOCUMB,Latreshia G   Account Number:  [id number]  Date Initiated:  03/22/2014  Documentation initiated by:  Camala Talwar  Subjective/Objective Assessment:   Pt adm on 03/22/14 with SBO.  PTA, pt independent of ADLs; lives with roommate.     Action/Plan:   Will follow for dc needs as pt progresses.  Referral to CSW/Chaplain for Advanced Directives request.   Anticipated DC Date:  03/28/2014   Anticipated DC Plan:  HOME/SELF CARE  In-house referral  Clinical Social Worker  Chaplain      DC Planning Services  CM consult      Choice offered to / List presented to:             Status of service:  In process, will continue to follow Medicare Important Message given?  YES (If response is "NO", the following Medicare IM given date fields will be blank) Date Medicare IM given:  03/25/2014 Medicare IM given by:  Myrikal Messmer Date Additional Medicare IM given:   Additional Medicare IM given by:    Discharge Disposition:    Per UR Regulation:  Reviewed for med. necessity/level of care/duration of stay  If discussed at Long Length of Stay Meetings, dates discussed:    Comments:

## 2014-03-22 NOTE — Consult Note (Signed)
[name] Jerry October 05, 1960  [id number].   Requesting MD: Dr. Thad Ranger Chief Complaint/Reason for Consult: Partial small bowel obstruction HPI: This is a very pleasant 54 yo black female with a very extensive abdominal surgical history.  In short, she had a hysterectomy in 2003, which resulted in small bowel prolapse through her vagina and subsequent ex lap with removal of bowel and closure of vaginal cuff.  She then had a SBO with strangulated ischemic bowel in 2004.  She underwent an ex lap with SBR x2.  She developed abdominal compartment syndrome and required opening with abdominal VAC placement.  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  She was transferred to [facility] under Dr. Morene Rankins care in August of 2004.  I am unable to find any further information in care everywhere as to what surgical procedures she had done there.  In 2012, she underwent an incisional hernia repair, I suspect with mesh, but operative report is also unavailable as well.  She began having some abdominal pain on Tuesday.  This continued to persist.  She then developed some N/V.  Ginger ale and sprite did not help her nausea.  She did have some diarrhea yesterday and has past gas today, but due to emesis and pain, she finally came to the [facility] yesterday.  She had a CT scan completed that revealed a dilated loop of small bowel up to 10cm in size.  She was admitted and we have been asked to evaluate the patient for further surgical recommendations.  ROS: Please see HPI, otherwise negative  Family History  Problem Relation Age of Onset  . Cancer Mother   . Heart attack Father     Past Medical History  Diagnosis Date  . COPD (chronic obstructive pulmonary disease)   . Hypertension   . GERD (gastroesophageal reflux disease)   . Osteoarthritis   . Cataract   . Hyperlipidemia     Past Surgical History  Procedure Laterality Date  . Abdominal hysterectomy  2003  . Knee surgery    . Bowel resection  2004    x2  with primary anastamosis  . Total knee arthroplasty Right     [facility]  . Laparoscopic incisional / umbilical / ventral hernia repair  2012    [facility], Dr. Carolynn Sayers  . Abdominal exploration surgery  2004    abdominal compartment syndrome  . Abdominal exploration surgery  2003    small bowel prolapse through vagina  . Diagnostic hysteroscopy with dilatation and curettage  2003    Social History:  reports that she has never smoked. She does not have any smokeless tobacco history on file. She reports that she does not drink alcohol or use illicit drugs.  Allergies:  Allergies  Allergen Reactions  . Aspirin   . Corn-Containing Products   . Ibuprofen   . Latex   . Morphine And Related   . Peanuts [Peanut Oil]   . Shellfish Allergy   . Zofran [Ondansetron Hcl] Itching    Medications Prior to Admission  Medication Sig Dispense Refill  . albuterol (PROVENTIL HFA;VENTOLIN HFA) 108 (90 BASE) MCG/ACT inhaler Inhale 1 puff into the lungs every 6 (six) hours as needed for wheezing or shortness of breath.    Marland Kitchen amLODipine (NORVASC) 10 MG tablet Take 10 mg by mouth daily.    Marland Kitchen esomeprazole (NEXIUM) 40 MG capsule Take 40 mg by mouth daily at 12 noon.    . Fluticasone-Salmeterol (ADVAIR) 500-50 MCG/DOSE AEPB Inhale 1 puff into the lungs  2 (two) times daily.    Marland Kitchen lisinopril (PRINIVIL,ZESTRIL) 20 MG tablet Take 20 mg by mouth daily.    Marland Kitchen LORazepam (ATIVAN) 0.5 MG tablet Take 0.5 mg by mouth every 6 (six) hours as needed for anxiety.    Marland Kitchen oxyCODONE-acetaminophen (PERCOCET/ROXICET) 5-325 MG per tablet Take 1-2 tablets by mouth every 6 (six) hours as needed for severe pain. 15 tablet 0  . polyethylene glycol (MIRALAX / GLYCOLAX) packet Take 17 g by mouth daily.    . rosuvastatin (CRESTOR) 10 MG tablet Take 10 mg by mouth daily.    . sertraline (ZOLOFT) 100 MG tablet Take 100 mg by mouth daily.    Marland Kitchen tiotropium (SPIRIVA) 18 MCG inhalation capsule Place 18 mcg into inhaler and inhale daily.    Marland Kitchen zolpidem  (AMBIEN) 10 MG tablet Take 10 mg by mouth at bedtime as needed for sleep.    . Esomeprazole Magnesium (NEXIUM PO) Take by mouth.    Marland Kitchen ipratropium (ATROVENT) 0.06 % nasal spray Place 2 sprays into the nose 4 (four) times daily. (Patient not taking: Reported on 03/22/2014) 15 mL 12  . LISINOPRIL PO Take by mouth.    . minocycline (MINOCIN,DYNACIN) 100 MG capsule Take 1 capsule (100 mg total) by mouth 2 (two) times daily. (Patient not taking: Reported on 03/22/2014) 20 capsule 0  . Rosuvastatin Calcium (CRESTOR PO) Take by mouth.    . traMADol (ULTRAM) 50 MG tablet Take 1 tablet (50 mg total) by mouth every 6 (six) hours as needed for moderate pain. (Patient not taking: Reported on 03/22/2014) 20 tablet 0    Blood pressure 108/60, pulse 67, temperature 97.6 F (36.4 C), temperature source Oral, resp. rate 20, height $RemoveBe'5\' 2"'vczIbUHtM$  (1.575 m), weight 201 lb 11.5 oz (91.5 kg), SpO2 98 %. Physical Exam: General: pleasant, slightly obese black female who is laying in bed in NAD HEENT: head is normocephalic, atraumatic.  Sclera are noninjected.  PERRL.  Ears and nose without any masses or lesions.  Mouth is pink and moist Heart: regular, rate, and rhythm.  Normal s1,s2. No obvious murmurs, gallops, or rubs noted.  Palpable radial and pedal pulses bilaterally Lungs: CTAB, no wheezes, rhonchi, or rales noted.  Respiratory effort nonlabored Abd: soft, minimal tenderness in her mid to lower abdomen, ND, +BS, no masses, hernias, or organomegaly MS: all 4 extremities are symmetrical with no cyanosis, clubbing, or edema. Skin: warm and dry with no masses, lesions, or rashes Psych: A&Ox3 with an appropriate affect.    Results for orders placed or performed during the hospital encounter of 03/21/14 (from the past 48 hour(s))  Urinalysis, Routine w reflex microscopic     Status: None   Collection Time: 03/21/14  7:01 PM  Result Value Ref Range   Color, Urine YELLOW YELLOW   APPearance CLEAR CLEAR   Specific Gravity,  Urine 1.008 1.005 - 1.030   pH 7.0 5.0 - 8.0   Glucose, UA NEGATIVE NEGATIVE mg/dL   Hgb urine dipstick NEGATIVE NEGATIVE   Bilirubin Urine NEGATIVE NEGATIVE   Ketones, ur NEGATIVE NEGATIVE mg/dL   Protein, ur NEGATIVE NEGATIVE mg/dL   Urobilinogen, UA 1.0 0.0 - 1.0 mg/dL   Nitrite NEGATIVE NEGATIVE   Leukocytes, UA NEGATIVE NEGATIVE    Comment: MICROSCOPIC NOT DONE ON URINES WITH NEGATIVE PROTEIN, BLOOD, LEUKOCYTES, NITRITE, OR GLUCOSE <1000 mg/dL.  CBC with Differential     Status: None   Collection Time: 03/21/14  7:08 PM  Result Value Ref Range   WBC 5.8 4.0 -  10.5 K/uL   RBC 4.79 3.87 - 5.11 MIL/uL   Hemoglobin 13.6 12.0 - 15.0 g/dL   HCT 41.3 36.0 - 46.0 %   MCV 86.2 78.0 - 100.0 fL   MCH 28.4 26.0 - 34.0 pg   MCHC 32.9 30.0 - 36.0 g/dL   RDW 14.2 11.5 - 15.5 %   Platelets 242 150 - 400 K/uL   Neutrophils Relative % 50 43 - 77 %   Neutro Abs 2.9 1.7 - 7.7 K/uL   Lymphocytes Relative 42 12 - 46 %   Lymphs Abs 2.5 0.7 - 4.0 K/uL   Monocytes Relative 7 3 - 12 %   Monocytes Absolute 0.4 0.1 - 1.0 K/uL   Eosinophils Relative 1 0 - 5 %   Eosinophils Absolute 0.1 0.0 - 0.7 K/uL   Basophils Relative 0 0 - 1 %   Basophils Absolute 0.0 0.0 - 0.1 K/uL  Comprehensive metabolic panel     Status: None   Collection Time: 03/21/14  7:08 PM  Result Value Ref Range   Sodium 138 135 - 145 mmol/L   Potassium 4.4 3.5 - 5.1 mmol/L   Chloride 101 96 - 112 mmol/L   CO2 29 19 - 32 mmol/L   Glucose, Bld 94 70 - 99 mg/dL   BUN 6 6 - 23 mg/dL   Creatinine, Ser 0.70 0.50 - 1.10 mg/dL   Calcium 10.0 8.4 - 10.5 mg/dL   Total Protein 7.6 6.0 - 8.3 g/dL   Albumin 4.3 3.5 - 5.2 g/dL   AST 28 0 - 37 U/L   ALT 16 0 - 35 U/L   Alkaline Phosphatase 77 39 - 117 U/L   Total Bilirubin 0.6 0.3 - 1.2 mg/dL   GFR calc non Af Amer >90 >90 mL/min   GFR calc Af Amer >90 >90 mL/min    Comment: (NOTE) The eGFR has been calculated using the CKD EPI equation. This calculation has not been validated in all  clinical situations. eGFR's persistently <90 mL/min signify possible Chronic Kidney Disease.    Anion gap 8 5 - 15  Lipase, blood     Status: None   Collection Time: 03/21/14  7:08 PM  Result Value Ref Range   Lipase 26 11 - 59 U/L  MRSA PCR Screening     Status: None   Collection Time: 03/22/14  3:24 AM  Result Value Ref Range   MRSA by PCR NEGATIVE NEGATIVE    Comment:        The GeneXpert MRSA Assay (FDA approved for NASAL specimens only), is one component of a comprehensive MRSA colonization surveillance program. It is not intended to diagnose MRSA infection nor to guide or monitor treatment for MRSA infections.    Ct Abdomen Pelvis W Contrast  03/22/2014   CLINICAL DATA:  Acute onset of generalized abdominal pain. Initial encounter.  EXAM: CT ABDOMEN AND PELVIS WITH CONTRAST  TECHNIQUE: Multidetector CT imaging of the abdomen and pelvis was performed using the standard protocol following bolus administration of intravenous contrast.  CONTRAST:  177mL OMNIPAQUE IOHEXOL 300 MG/ML  SOLN  COMPARISON:  CT of the abdomen and pelvis performed 08/13/2007  FINDINGS: Focal bronchiectasis is noted within the right lower lobe.  The liver and spleen are unremarkable in appearance. Stones are seen dependently within the gallbladder; the gallbladder is otherwise unremarkable. The pancreas and adrenal glands are unremarkable.  The kidneys are unremarkable in appearance. There is no evidence of hydronephrosis. No renal or ureteral stones are  seen. No perinephric stranding is appreciated.  There is marked dilatation of an unusual small bowel segment along the right side of the abdomen, measuring up to 10.0 cm. This is of uncertain significance, and may reflect prior surgery, with an apparent bowel suture line partially imaged. Contrast does not pass across this segment, possibly reflecting significant associated dysmotility. Just proximal to this segment, there is mild focal wall thickening along the  proximal ileum. There is relative decompression of more distal small bowel loops, though a small amount of fluid is still seen within the majority of these loops. An underlying adhesion causing partial small-bowel obstruction cannot be excluded. The distal ileum is decompressed, to the level of the ileocecal junction.  No free fluid is identified. The stomach is within normal limits. No acute vascular abnormalities are seen. Minimal calcification is seen along the common iliac arteries bilaterally. There is new prominence of mesenteric nodes, measuring up to 1.2 cm in short axis.  Mild scarring is noted along the anterior abdominal wall, reflecting prior surgery.  The patient may be status post appendectomy, as a small focus of increased attenuation is noted at the cecum. There is no evidence of appendicitis. The colon is unremarkable in appearance. There is apparent wall thickening along the distal rectum; this may simply reflect chronic inflammation or intraluminal contents, though depending on the patient's symptoms, sigmoidoscopy could be considered for further evaluation.  The bladder is mildly distended and grossly unremarkable. The patient is status post hysterectomy. No suspicious adnexal masses are seen. No inguinal lymphadenopathy is seen.  No acute osseous abnormalities are identified.  IMPRESSION: 1. Marked dilatation of an unusual small bowel segment along the right side of the abdomen, measuring up to 10.0 cm. Dilatation is new from the prior study. Some of this may reflect prior surgery, with an apparent associated bowel suture line. Contrast does not pass across this segment, possibly reflecting significant associated dysmotility. There is mild focal wall thickening along the proximal ileum just proximal to this segment, and relative decompression of more distal small bowel loops, though a small amount of fluid is still seen within the majority of the distal loops. Cannot exclude underlying adhesion,  causing partial small bowel obstruction. 2. New prominence of mesenteric nodes, measuring up to 1.2 cm in short axis. An underlying mass cannot be excluded, but is not well assessed on this study. If the patient's symptoms persist, PET/CT could be considered for further evaluation. 3. Cholelithiasis; gallbladder otherwise unremarkable. 4. Apparent wall thickening along the distal rectum. This may simply reflect chronic inflammation or intraluminal contents, though depending on the patient's symptoms, sigmoidoscopy could be considered for further evaluation. 5. Focal bronchiectasis at the right lower lung lobe.   Electronically Signed   By: Garald Balding M.D.   On: 03/22/2014 00:50       Assessment/Plan 1. Partial small bowel obstruction 2. Extensive past abdominal surgeries 3. COPD 4. HTN  Plan: 1. The patient has small bowel that is very dilated, up to 10cm.  Even though she does not have nausea or emesis currently, she needs NGT decompression due to her dilated small bowel.  We would like to get her better with conservative management if possible, given her surgical history.  We would like to decompress her for at least 24 hours, per MD, prior to initiating the small bowel protocol due to the size of her bowels.  I have discussed this with the patient and she understands and is agreeable. 2. Repeat abdominal films  in the morning.  Mariadelaluz Guggenheim E 03/22/2014, 11:16 AM Pager: [phone]

## 2014-03-22 NOTE — Progress Notes (Signed)
   03/22/14 1000  Clinical Encounter Type  Visited With Patient;Health care provider  Visit Type Initial;Spiritual support;Social support  Spiritual Encounters  Spiritual Needs Prayer;Emotional  Stress Factors  Patient Stress Factors Exhausted;Family relationships;Health changes;Lack of knowledge;Loss of control;Major life changes   Chaplain was referred to patient via spiritual care consult. Patient wanted to talk about some of the difficult things going on in her life. Patient explained that her mother is seriously ill is in the process of passing away. Patient has been her mother's caregiver and a source of strength for the rest of the family during this time. Patient became highly emotional and anxious while talking about this and was not able to complete sentences. Patient asked for her nurse. Chaplain found the patient's nurse. Patient has asked chaplain to come back when she feels more calm. Page Merrilyn Puman-Call chaplain when patient would like a visit. Nayleen Janosik, Tommi EmeryBlake R, Chaplain  10:16 AM

## 2014-03-22 NOTE — ED Notes (Signed)
Dr. Selena BattenKim stated that NG tube could wait until morning that surgeons would place.

## 2014-03-22 NOTE — Progress Notes (Signed)
16 french Ntube placed earlier today to right nare.Patient did not tolerate well. She was medicated with ativan before placing NG, but she started to hyperventilate during the placement. We were able to get in and connect to suction. There was only a small amount of mucous noted coming from tube. Page Dr,Rai and received orders for dilaudid prn for pain. Phenergan was also given soon after NG tube was placed.

## 2014-03-22 NOTE — Progress Notes (Signed)
Pt currently resting quietly without complaints. Denies pain and discomfort at this time.

## 2014-03-23 ENCOUNTER — Inpatient Hospital Stay (HOSPITAL_COMMUNITY): Payer: Medicare HMO

## 2014-03-23 DIAGNOSIS — I10 Essential (primary) hypertension: Secondary | ICD-10-CM

## 2014-03-23 LAB — CBC
HCT: 35.2 % — ABNORMAL LOW (ref 36.0–46.0)
Hemoglobin: 11.5 g/dL — ABNORMAL LOW (ref 12.0–15.0)
MCH: 28.4 pg (ref 26.0–34.0)
MCHC: 32.7 g/dL (ref 30.0–36.0)
MCV: 86.9 fL (ref 78.0–100.0)
Platelets: 205 K/uL (ref 150–400)
RBC: 4.05 MIL/uL (ref 3.87–5.11)
RDW: 14.2 % (ref 11.5–15.5)
WBC: 4.6 K/uL (ref 4.0–10.5)

## 2014-03-23 LAB — BASIC METABOLIC PANEL WITH GFR
Anion gap: 7 (ref 5–15)
BUN: <5 mg/dL — ABNORMAL LOW (ref 6–23)
CO2: 24 mmol/L (ref 19–32)
Calcium: 8.8 mg/dL (ref 8.4–10.5)
Chloride: 104 mmol/L (ref 96–112)
Creatinine, Ser: 0.63 mg/dL (ref 0.50–1.10)
GFR calc Af Amer: >90 mL/min
GFR calc non Af Amer: >90 mL/min
Glucose, Bld: 62 mg/dL — ABNORMAL LOW (ref 70–99)
Potassium: 3.9 mmol/L (ref 3.5–5.1)
Sodium: 135 mmol/L (ref 135–145)

## 2014-03-23 MED ORDER — BISACODYL 10 MG RE SUPP
10.0000 mg | Freq: Once | RECTAL | Status: AC
  Administered 2014-03-23: 10 mg via RECTAL

## 2014-03-23 NOTE — Progress Notes (Signed)
Subjective: + flatus, no BM, wants NG out.  She says she never has BM without laxitive   Objective: Vital signs in last 24 hours: Temp:  [97.5 F (36.4 C)-98.7 F (37.1 C)] 97.8 F (36.6 C) (01/30 0540) Pulse Rate:  [67-85] 82 (01/30 0540) Resp:  [16-20] 16 (01/30 0540) BP: (100-108)/(52-60) 100/52 mmHg (01/30 0540) SpO2:  [95 %-100 %] 100 % (01/30 0540) Weight:  [90.881 kg (200 lb 5.7 oz)] 90.881 kg (200 lb 5.7 oz) (01/30 0540) Last BM Date: 03/21/14 Nothing from the NG, she was unhooked and going down for a film so I don't know if it's working or not No BM  Afebrile , VSS No labs  Intake/Output from previous day: 01/29 0701 - 01/30 0700 In: 0  Out: 1050 [Urine:1050] Intake/Output this shift:    General appearance: alert, cooperative and no distress Resp: clear to auscultation bilaterally GI: soft, few BS, no distension she is comfortable up in wheel chair.  Lab Results:   Recent Labs  03/21/14 1908  WBC 5.8  HGB 13.6  HCT 41.3  PLT 242    BMET  Recent Labs  03/21/14 1908  NA 138  K 4.4  CL 101  CO2 29  GLUCOSE 94  BUN 6  CREATININE 0.70  CALCIUM 10.0   PT/INR No results for input(s): LABPROT, INR in the last 72 hours.   Recent Labs Lab 03/21/14 1908  AST 28  ALT 16  ALKPHOS 77  BILITOT 0.6  PROT 7.6  ALBUMIN 4.3     Lipase     Component Value Date/Time   LIPASE 26 03/21/2014 1908     Studies/Results: Ct Abdomen Pelvis W Contrast  03/22/2014   CLINICAL DATA:  Acute onset of generalized abdominal pain. Initial encounter.  EXAM: CT ABDOMEN AND PELVIS WITH CONTRAST  TECHNIQUE: Multidetector CT imaging of the abdomen and pelvis was performed using the standard protocol following bolus administration of intravenous contrast.  CONTRAST:  OMNIPAQUE IOHEXOL 300 MG/ML  SOLN  COMPARISON:  CT of the abdomen and pelvis performed 08/13/2007  FINDINGS: Focal bronchiectasis is noted within the right lower lobe.  The liver and spleen are  unremarkable in appearance. Stones are seen dependently within the gallbladder; the gallbladder is otherwise unremarkable. The pancreas and adrenal glands are unremarkable.  The kidneys are unremarkable in appearance. There is no evidence of hydronephrosis. No renal or ureteral stones are seen. No perinephric stranding is appreciated.  There is marked dilatation of an unusual small bowel segment along the right side of the abdomen, measuring up to 10.0 cm. This is of uncertain significance, and may reflect prior surgery, with an apparent bowel suture line partially imaged. Contrast does not pass across this segment, possibly reflecting significant associated dysmotility. Just proximal to this segment, there is mild focal wall thickening along the proximal ileum. There is relative decompression of more distal small bowel loops, though a small amount of fluid is still seen within the majority of these loops. An underlying adhesion causing partial small-bowel obstruction cannot be excluded. The distal ileum is decompressed, to the level of the ileocecal junction.  No free fluid is identified. The stomach is within normal limits. No acute vascular abnormalities are seen. Minimal calcification is seen along the common iliac arteries bilaterally. There is new prominence of mesenteric nodes, measuring up to 1.2 cm in short axis.  Mild scarring is noted along the anterior abdominal wall, reflecting prior surgery.  The patient may be status post appendectomy, as  a small focus of increased attenuation is noted at the cecum. There is no evidence of appendicitis. The colon is unremarkable in appearance. There is apparent wall thickening along the distal rectum; this may simply reflect chronic inflammation or intraluminal contents, though depending on the patient's symptoms, sigmoidoscopy could be considered for further evaluation.  The bladder is mildly distended and grossly unremarkable. The patient is status post  hysterectomy. No suspicious adnexal masses are seen. No inguinal lymphadenopathy is seen.  No acute osseous abnormalities are identified.  IMPRESSION: 1. Marked dilatation of an unusual small bowel segment along the right side of the abdomen, measuring up to 10.0 cm. Dilatation is new from the prior study. Some of this may reflect prior surgery, with an apparent associated bowel suture line. Contrast does not pass across this segment, possibly reflecting significant associated dysmotility. There is mild focal wall thickening along the proximal ileum just proximal to this segment, and relative decompression of more distal small bowel loops, though a small amount of fluid is still seen within the majority of the distal loops. Cannot exclude underlying adhesion, causing partial small bowel obstruction. 2. New prominence of mesenteric nodes, measuring up to 1.2 cm in short axis. An underlying mass cannot be excluded, but is not well assessed on this study. If the patient's symptoms persist, PET/CT could be considered for further evaluation. 3. Cholelithiasis; gallbladder otherwise unremarkable. 4. Apparent wall thickening along the distal rectum. This may simply reflect chronic inflammation or intraluminal contents, though depending on the patient's symptoms, sigmoidoscopy could be considered for further evaluation. 5. Focal bronchiectasis at the right lower lung lobe.   Electronically Signed   By: Roanna RaiderJeffery  Chang M.D.   On: 03/22/2014 00:50   Dg Abd Portable 1v  03/22/2014   CLINICAL DATA:  Nasogastric tube placement  EXAM: PORTABLE ABDOMEN - 1 VIEW  COMPARISON:  Portable exam 1349 hr compared to CT abdomen and pelvis of 03/22/2014  FINDINGS: Nasogastric tube projects over proximal stomach.  Retained contrast in colon indicating a lack of complete bowel obstruction since administration of GI contrast earlier same date.  Persistent significant gaseous distention of a small bowel loop in the upper mid abdomen,  uncertain etiology.  This appearance is atypical for a standard small bowel obstruction due to adhesions in that several significantly dilated loops are present in the mid small bowel with normal caliber loops more proximally.  This could represent an atypical small bowel obstruction or an adynamic / atonic segment of small bowel as result of prior surgery.  No definite evidence of internal hernia or small bowel volvulus by prior CT.  Prior CT demonstrated focal wall thickening of a small bowel loop at the point of obstruction in the central pelvis, cannot exclude edema or mass.  No definite bowel wall thickening or gross free intraperitoneal air by supine radiograph.  Bones unremarkable.  IMPRESSION: Nasogastric tube projects over proximal stomach.  Persistent gaseous distension of a small bowel loop in the upper mid abdomen, up to 9.3 cm diameter, question atypical partial small bowel obstruction or potentially an adynamic/atonic segment of small bowel as result of prior surgery.  No definite evidence of volvulus or internal hernia by CT.  Recommend continued followup.   Electronically Signed   By: Ulyses SouthwardMark  Boles M.D.   On: 03/22/2014 15:05    Medications: . bisacodyl  10 mg Rectal Once  . heparin subcutaneous  5,000 Units Subcutaneous 3 times per day  . ipratropium  2 spray Nasal QID  .  LORazepam  2 mg Intravenous Once  . mometasone-formoterol  2 puff Inhalation BID  . pantoprazole (PROTONIX) IV  40 mg Intravenous Q12H  . sertraline  100 mg Oral Daily  . sodium chloride  3 mL Intravenous Q12H  . tiotropium  18 mcg Inhalation Daily   . sodium chloride 100 mL/hr at 03/22/14 0310   Prior to Admission medications   Medication Sig Start Date End Date Taking? Authorizing Provider  albuterol (PROVENTIL HFA;VENTOLIN HFA) 108 (90 BASE) MCG/ACT inhaler Inhale 1 puff into the lungs every 6 (six) hours as needed for wheezing or shortness of breath.   Yes Historical Provider, MD  amLODipine (NORVASC) 10 MG  tablet Take 10 mg by mouth daily.   Yes Historical Provider, MD  esomeprazole (NEXIUM) 40 MG capsule Take 40 mg by mouth daily at 12 noon.   Yes Historical Provider, MD  Fluticasone-Salmeterol (ADVAIR) 500-50 MCG/DOSE AEPB Inhale 1 puff into the lungs 2 (two) times daily.   Yes Historical Provider, MD  lisinopril (PRINIVIL,ZESTRIL) 20 MG tablet Take 20 mg by mouth daily.   Yes Historical Provider, MD  LORazepam (ATIVAN) 0.5 MG tablet Take 0.5 mg by mouth every 6 (six) hours as needed for anxiety.   Yes Historical Provider, MD  oxyCODONE-acetaminophen (PERCOCET/ROXICET) 5-325 MG per tablet Take 1-2 tablets by mouth every 6 (six) hours as needed for severe pain. 01/30/14  Yes Roxy Horseman, PA-C  polyethylene glycol (MIRALAX / GLYCOLAX) packet Take 17 g by mouth daily.   Yes Historical Provider, MD  rosuvastatin (CRESTOR) 10 MG tablet Take 10 mg by mouth daily.   Yes Historical Provider, MD  sertraline (ZOLOFT) 100 MG tablet Take 100 mg by mouth daily.   Yes Historical Provider, MD  tiotropium (SPIRIVA) 18 MCG inhalation capsule Place 18 mcg into inhaler and inhale daily.   Yes Historical Provider, MD  zolpidem (AMBIEN) 10 MG tablet Take 10 mg by mouth at bedtime as needed for sleep.   Yes Historical Provider, MD  Esomeprazole Magnesium (NEXIUM PO) Take by mouth.    Historical Provider, MD  ipratropium (ATROVENT) 0.06 % nasal spray Place 2 sprays into the nose 4 (four) times daily. Patient not taking: Reported on 03/22/2014 05/05/13   Linna Hoff, MD  LISINOPRIL PO Take by mouth.    Historical Provider, MD  minocycline (MINOCIN,DYNACIN) 100 MG capsule Take 1 capsule (100 mg total) by mouth 2 (two) times daily. Patient not taking: Reported on 03/22/2014 05/05/13   Linna Hoff, MD  Rosuvastatin Calcium (CRESTOR PO) Take by mouth.    Historical Provider, MD  traMADol (ULTRAM) 50 MG tablet Take 1 tablet (50 mg total) by mouth every 6 (six) hours as needed for moderate pain. Patient not taking: Reported  on 03/22/2014 12/28/13   Linna Hoff, MD     Assessment/Plan SBO/prior hysterectomy/bowel resection/multipe abdominal surgeries Chronic pain with narcotic use at home Constipation with narcotic use - she is on Miralax at home COPD Hypertension S/p Knee replacement    Plan:  Nothing from the NG, so I suspect it is not working.  Talked to nurse and they will flush and be sure it's working.  Film pending, on exam she looks good.  Will check film and if OK clamp NG, get her ambulating. Dr. Isidoro Donning is going to give her a suppository. PT to help with ambulation.   LOS: 2 days    Sarvesh Meddaugh 03/23/2014

## 2014-03-23 NOTE — Progress Notes (Signed)
Subjective: Passed some flatus but no BM.   Objective: Vital signs in last 24 hours: Temp:  [97.5 F (36.4 C)-98.7 F (37.1 C)] 97.5 F (36.4 C) (01/30 0847) Pulse Rate:  [75-87] 87 (01/30 0847) Resp:  [16-18] 18 (01/30 0847) BP: (100-106)/(52-64) 101/64 mmHg (01/30 0847) SpO2:  [95 %-100 %] 99 % (01/30 0847) Weight:  [200 lb 5.7 oz (90.881 kg)] 200 lb 5.7 oz (90.881 kg) (01/30 0540) Last BM Date: 03/21/14  Intake/Output from previous day: 01/29 0701 - 01/30 0700 In: 0  Out: 1050 [Urine:1050] Intake/Output this shift: Total I/O In: 40 [NG/GT:40] Out: 200 [Emesis/NG output:200]  Resp: clear to auscultation bilaterally Cardio: regular rate and rhythm GI: soft, moderate distention, mild tenderness on L, quiet Neurologic: Mental status: Alert, oriented, thought content appropriate  Lab Results:   Recent Labs  03/21/14 1908 03/23/14 0602  WBC 5.8 4.6  HGB 13.6 11.5*  HCT 41.3 35.2*  PLT 242 205   BMET  Recent Labs  03/21/14 1908 03/23/14 0602  NA 138 135  K 4.4 3.9  CL 101 104  CO2 29 24  GLUCOSE 94 62*  BUN 6 <5*  CREATININE 0.70 0.63  CALCIUM 10.0 8.8   PT/INR No results for input(s): LABPROT, INR in the last 72 hours. ABG No results for input(s): PHART, HCO3 in the last 72 hours.  Invalid input(s): PCO2, PO2  Studies/Results: Ct Abdomen Pelvis W Contrast  03/22/2014   CLINICAL DATA:  Acute onset of generalized abdominal pain. Initial encounter.  EXAM: CT ABDOMEN AND PELVIS WITH CONTRAST  TECHNIQUE: Multidetector CT imaging of the abdomen and pelvis was performed using the standard protocol following bolus administration of intravenous contrast.  CONTRAST:  OMNIPAQUE IOHEXOL 300 MG/ML  SOLN  COMPARISON:  CT of the abdomen and pelvis performed 08/13/2007  FINDINGS: Focal bronchiectasis is noted within the right lower lobe.  The liver and spleen are unremarkable in appearance. Stones are seen dependently within the gallbladder; the gallbladder is  otherwise unremarkable. The pancreas and adrenal glands are unremarkable.  The kidneys are unremarkable in appearance. There is no evidence of hydronephrosis. No renal or ureteral stones are seen. No perinephric stranding is appreciated.  There is marked dilatation of an unusual small bowel segment along the right side of the abdomen, measuring up to 10.0 cm. This is of uncertain significance, and may reflect prior surgery, with an apparent bowel suture line partially imaged. Contrast does not pass across this segment, possibly reflecting significant associated dysmotility. Just proximal to this segment, there is mild focal wall thickening along the proximal ileum. There is relative decompression of more distal small bowel loops, though a small amount of fluid is still seen within the majority of these loops. An underlying adhesion causing partial small-bowel obstruction cannot be excluded. The distal ileum is decompressed, to the level of the ileocecal junction.  No free fluid is identified. The stomach is within normal limits. No acute vascular abnormalities are seen. Minimal calcification is seen along the common iliac arteries bilaterally. There is new prominence of mesenteric nodes, measuring up to 1.2 cm in short axis.  Mild scarring is noted along the anterior abdominal wall, reflecting prior surgery.  The patient may be status post appendectomy, as a small focus of increased attenuation is noted at the cecum. There is no evidence of appendicitis. The colon is unremarkable in appearance. There is apparent wall thickening along the distal rectum; this may simply reflect chronic inflammation or intraluminal contents, though depending on the  patient's symptoms, sigmoidoscopy could be considered for further evaluation.  The bladder is mildly distended and grossly unremarkable. The patient is status post hysterectomy. No suspicious adnexal masses are seen. No inguinal lymphadenopathy is seen.  No acute osseous  abnormalities are identified.  IMPRESSION: 1. Marked dilatation of an unusual small bowel segment along the right side of the abdomen, measuring up to 10.0 cm. Dilatation is new from the prior study. Some of this may reflect prior surgery, with an apparent associated bowel suture line. Contrast does not pass across this segment, possibly reflecting significant associated dysmotility. There is mild focal wall thickening along the proximal ileum just proximal to this segment, and relative decompression of more distal small bowel loops, though a small amount of fluid is still seen within the majority of the distal loops. Cannot exclude underlying adhesion, causing partial small bowel obstruction. 2. New prominence of mesenteric nodes, measuring up to 1.2 cm in short axis. An underlying mass cannot be excluded, but is not well assessed on this study. If the patient's symptoms persist, PET/CT could be considered for further evaluation. 3. Cholelithiasis; gallbladder otherwise unremarkable. 4. Apparent wall thickening along the distal rectum. This may simply reflect chronic inflammation or intraluminal contents, though depending on the patient's symptoms, sigmoidoscopy could be considered for further evaluation. 5. Focal bronchiectasis at the right lower lung lobe.   Electronically Signed   By: Roanna RaiderJeffery  Chang M.D.   On: 03/22/2014 00:50   Dg Abd 2 Views  03/23/2014   CLINICAL DATA:  Follow-up small bowel obstruction. Diarrhea and vomiting for 2 days. Subsequent encounter.  EXAM: ABDOMEN - 2 VIEW  COMPARISON:  Abdominal radiograph from 03/22/2014  FINDINGS: There is persistent marked dilatation of a small bowel loop at the right side of the abdomen, measuring up to 10.3 cm in diameter. The dilated segment appears slightly shorter than on the prior study, though it remains equally dilated. As contrast has made it into the colon, this is less likely to reflect bowel obstruction. It may reflect an adynamic segment of small  bowel, as a result of prior surgery. The colon is unremarkable in appearance.  No free intra-abdominal air is identified. An enteric tube is seen ending overlying the left upper quadrant. The visualized lung bases are grossly clear. No acute osseous abnormalities are seen.  IMPRESSION: Stable persistent marked dilatation of a small bowel loop at the right side of the abdomen. The dilated segment appears to be shorter than on the prior study. As contrast is seen filling the colon, this is unlikely to reflect bowel obstruction. It may reflect an adynamic segment of small bowel, as a result of prior surgery.   Electronically Signed   By: Roanna RaiderJeffery  Chang M.D.   On: 03/23/2014 08:46   Dg Abd Portable 1v  03/22/2014   CLINICAL DATA:  Nasogastric tube placement  EXAM: PORTABLE ABDOMEN - 1 VIEW  COMPARISON:  Portable exam 1349 hr compared to CT abdomen and pelvis of 03/22/2014  FINDINGS: Nasogastric tube projects over proximal stomach.  Retained contrast in colon indicating a lack of complete bowel obstruction since administration of GI contrast earlier same date.  Persistent significant gaseous distention of a small bowel loop in the upper mid abdomen, uncertain etiology.  This appearance is atypical for a standard small bowel obstruction due to adhesions in that several significantly dilated loops are present in the mid small bowel with normal caliber loops more proximally.  This could represent an atypical small bowel obstruction or an adynamic /  atonic segment of small bowel as result of prior surgery.  No definite evidence of internal hernia or small bowel volvulus by prior CT.  Prior CT demonstrated focal wall thickening of a small bowel loop at the point of obstruction in the central pelvis, cannot exclude edema or mass.  No definite bowel wall thickening or gross free intraperitoneal air by supine radiograph.  Bones unremarkable.  IMPRESSION: Nasogastric tube projects over proximal stomach.  Persistent gaseous  distension of a small bowel loop in the upper mid abdomen, up to 9.3 cm diameter, question atypical partial small bowel obstruction or potentially an adynamic/atonic segment of small bowel as result of prior surgery.  No definite evidence of volvulus or internal hernia by CT.  Recommend continued followup.   Electronically Signed   By: Ulyses Southward M.D.   On: 03/22/2014 15:05    Anti-infectives: Anti-infectives    None      Assessment/Plan: SBO - suspect some chronicity in light of history. I know her well from surgeries in 2004. 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 She subsequently had an incisional hernia repair at [facility] in 2012. Unfortunately, these OP notes are not available in Care Everywhere. In light of the complexity of her 2 most recent surgeries that were done at [facility], my partners and I have discussed this and we recommend she be transferred to [facility] for management of this SBO. I spoke with her at length regarding this and she agrees.  LOS: 2 days    Pope Brunty E 03/23/2014

## 2014-03-23 NOTE — Progress Notes (Addendum)
[facility] and talked to the transfer coordinator and on call medicine Dr Mikel CellaJosh Johnson regarding the patient. Unfortunately, there are no beds available at the moment. I was requested to call between 5-5:30 pm and hopefully patient can be accepted at that time if beds are available.    Kemiyah Tarazon M.D. [facility] 03/23/2014, 2:25 PM  Pager: [phone]    5:39PM [facility] and they still do not have any beds available. Will call in AM for possible transfer.   Stoy Fenn M.D. [facility] 03/23/2014, 5:41 PM  Pager: [phone]

## 2014-03-23 NOTE — Progress Notes (Signed)
Patient ID: [name]  female  ZOX:[id number]    DOB: [birth year]    DOA: 03/21/2014  PCP: Diamantina Providence., FNP  Brief history of present illness and  Patient is a 54 year old female with history of recurrent SBO, hysterectomy, COPD, hypertension, hyperlipidemia, GERD presented with nausea, vomiting, abdominal pain for last 2 days prior to admission. Patient had slight diarrhea, liquidy stools. Otherwise denied any fevers, chills or rectal bleeding. Patient was admitted for partial SBO.   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 - CT abd and pelvis showed marked dilatation of small bowel segment along the right side of the abdomen up to 10 cm, new from the prior study. New mesenteric nodes measuring up to 1.2 cm in short axis, an underlying mass cannot be excluded recommended PET CT, cholelithiasis otherwise gallbladder unremarkable, apparent wall thickening along the distal rectum. - Cont NPO, NGT, management per surgery, - Still constipated, states always takes laxatives at home, placed on Dulcolax suppository  Active Problems:   COPD (chronic obstructive pulmonary disease) - Currently stable, no wheezing, continue albuterol, Spiriva, dulera    Hypertension - BP currently stable continue hydralazine as needed    GERD (gastroesophageal reflux disease) - Placed on IV PPI    Hyperlipidemia - Hold statins    Osteoarthritis - Continue IV pain medications as needed  DVT Prophylaxis: heparin sq  Code Status: FC  Family Communication:  Disposition:  Consultants:  surgery  Procedures:  CT abd pelvis  Antibiotics:  None    Subjective: No abdominal pain, no nausea or vomiting, constipated, + flatus  Objective: Weight change: 11.955 kg (26 lb 5.7 oz)  Intake/Output  Summary (Last 24 hours) at 03/23/14 0934 Last data filed at 03/23/14 0908  Gross per 24 hour  Intake     40 ml  Output    850 ml  Net   -810 ml   Blood pressure 101/64, pulse 87, temperature 97.5 F (36.4 C), temperature source Oral, resp. rate 18, height  (1.575 m), weight 90.881 kg (200 lb 5.7 oz), SpO2 99 %.  Physical Exam: General: Alert and awake, oriented x3, not in any acute distress. CVS: S1-S2 clear, no murmur rubs or gallops Chest: clear to auscultation bilaterally, no wheezing, rales or rhonchi Abdomen: soft, NT, ND, bs+  Extremities: no cyanosis, clubbing or edema noted bilaterally Neuro: Cranial nerves II-XII intact, no focal neurological deficits  Lab Results: Basic Metabolic Panel:  Recent Labs Lab 03/21/14 1908 03/23/14 0602  NA 138 135  K 4.4 3.9  CL 101 104  CO2 29 24  GLUCOSE 94 62*  BUN 6 <5*  CREATININE 0.70 0.63  CALCIUM 10.0 8.8   Liver Function Tests:  Recent Labs Lab 03/21/14 1908  AST 28  ALT 16  ALKPHOS 77  BILITOT 0.6  PROT 7.6  ALBUMIN 4.3    Recent Labs Lab 03/21/14 1908  LIPASE 26   No results for input(s): AMMONIA in the last 168 hours. CBC:  Recent Labs Lab 03/21/14 1908 03/23/14 0602  WBC 5.8 4.6  NEUTROABS 2.9  --   HGB 13.6 11.5*  HCT 41.3 35.2*  MCV 86.2 86.9  PLT 242 205   Cardiac Enzymes: No results for input(s): CKTOTAL, CKMB, CKMBINDEX, TROPONINI in the last 168 hours. BNP: Invalid input(s): POCBNP CBG: No results for input(s): GLUCAP in the  last 168 hours.   Micro Results: Recent Results (from the past 240 hour(s))  MRSA PCR Screening     Status: None   Collection Time: 03/22/14  3:24 AM  Result Value Ref Range Status   MRSA by PCR NEGATIVE NEGATIVE Final    Comment:        The GeneXpert MRSA Assay (FDA approved for NASAL specimens only), is one component of a comprehensive MRSA colonization surveillance program. It is not intended to diagnose MRSA infection nor to guide or monitor  treatment for MRSA infections.     Studies/Results: Ct Abdomen Pelvis W Contrast  03/22/2014   CLINICAL DATA:  Acute onset of generalized abdominal pain. Initial encounter.  EXAM: CT ABDOMEN AND PELVIS WITH CONTRAST  TECHNIQUE: Multidetector CT imaging of the abdomen and pelvis was performed using the standard protocol following bolus administration of intravenous contrast.  CONTRAST:  OMNIPAQUE IOHEXOL 300 MG/ML  SOLN  COMPARISON:  CT of the abdomen and pelvis performed 08/13/2007  FINDINGS: Focal bronchiectasis is noted within the right lower lobe.  The liver and spleen are unremarkable in appearance. Stones are seen dependently within the gallbladder; the gallbladder is otherwise unremarkable. The pancreas and adrenal glands are unremarkable.  The kidneys are unremarkable in appearance. There is no evidence of hydronephrosis. No renal or ureteral stones are seen. No perinephric stranding is appreciated.  There is marked dilatation of an unusual small bowel segment along the right side of the abdomen, measuring up to 10.0 cm. This is of uncertain significance, and may reflect prior surgery, with an apparent bowel suture line partially imaged. Contrast does not pass across this segment, possibly reflecting significant associated dysmotility. Just proximal to this segment, there is mild focal wall thickening along the proximal ileum. There is relative decompression of more distal small bowel loops, though a small amount of fluid is still seen within the majority of these loops. An underlying adhesion causing partial small-bowel obstruction cannot be excluded. The distal ileum is decompressed, to the level of the ileocecal junction.  No free fluid is identified. The stomach is within normal limits. No acute vascular abnormalities are seen. Minimal calcification is seen along the common iliac arteries bilaterally. There is new prominence of mesenteric nodes, measuring up to 1.2 cm in short axis.  Mild  scarring is noted along the anterior abdominal wall, reflecting prior surgery.  The patient may be status post appendectomy, as a small focus of increased attenuation is noted at the cecum. There is no evidence of appendicitis. The colon is unremarkable in appearance. There is apparent wall thickening along the distal rectum; this may simply reflect chronic inflammation or intraluminal contents, though depending on the patient's symptoms, sigmoidoscopy could be considered for further evaluation.  The bladder is mildly distended and grossly unremarkable. The patient is status post hysterectomy. No suspicious adnexal masses are seen. No inguinal lymphadenopathy is seen.  No acute osseous abnormalities are identified.  IMPRESSION: 1. Marked dilatation of an unusual small bowel segment along the right side of the abdomen, measuring up to 10.0 cm. Dilatation is new from the prior study. Some of this may reflect prior surgery, with an apparent associated bowel suture line. Contrast does not pass across this segment, possibly reflecting significant associated dysmotility. There is mild focal wall thickening along the proximal ileum just proximal to this segment, and relative decompression of more distal small bowel loops, though a small amount of fluid is still seen within the majority of the distal loops. Cannot  exclude underlying adhesion, causing partial small bowel obstruction. 2. New prominence of mesenteric nodes, measuring up to 1.2 cm in short axis. An underlying mass cannot be excluded, but is not well assessed on this study. If the patient's symptoms persist, PET/CT could be considered for further evaluation. 3. Cholelithiasis; gallbladder otherwise unremarkable. 4. Apparent wall thickening along the distal rectum. This may simply reflect chronic inflammation or intraluminal contents, though depending on the patient's symptoms, sigmoidoscopy could be considered for further evaluation. 5. Focal bronchiectasis at  the right lower lung lobe.   Electronically Signed   By: Roanna Raider M.D.   On: 03/22/2014 00:50   Dg Abd 2 Views  03/23/2014   CLINICAL DATA:  Follow-up small bowel obstruction. Diarrhea and vomiting for 2 days. Subsequent encounter.  EXAM: ABDOMEN - 2 VIEW  COMPARISON:  Abdominal radiograph from 03/22/2014  FINDINGS: There is persistent marked dilatation of a small bowel loop at the right side of the abdomen, measuring up to 10.3 cm in diameter. The dilated segment appears slightly shorter than on the prior study, though it remains equally dilated. As contrast has made it into the colon, this is less likely to reflect bowel obstruction. It may reflect an adynamic segment of small bowel, as a result of prior surgery. The colon is unremarkable in appearance.  No free intra-abdominal air is identified. An enteric tube is seen ending overlying the left upper quadrant. The visualized lung bases are grossly clear. No acute osseous abnormalities are seen.  IMPRESSION: Stable persistent marked dilatation of a small bowel loop at the right side of the abdomen. The dilated segment appears to be shorter than on the prior study. As contrast is seen filling the colon, this is unlikely to reflect bowel obstruction. It may reflect an adynamic segment of small bowel, as a result of prior surgery.   Electronically Signed   By: Roanna Raider M.D.   On: 03/23/2014 08:46   Dg Abd Portable 1v  03/22/2014   CLINICAL DATA:  Nasogastric tube placement  EXAM: PORTABLE ABDOMEN - 1 VIEW  COMPARISON:  Portable exam 1349 hr compared to CT abdomen and pelvis of 03/22/2014  FINDINGS: Nasogastric tube projects over proximal stomach.  Retained contrast in colon indicating a lack of complete bowel obstruction since administration of GI contrast earlier same date.  Persistent significant gaseous distention of a small bowel loop in the upper mid abdomen, uncertain etiology.  This appearance is atypical for a standard small bowel  obstruction due to adhesions in that several significantly dilated loops are present in the mid small bowel with normal caliber loops more proximally.  This could represent an atypical small bowel obstruction or an adynamic / atonic segment of small bowel as result of prior surgery.  No definite evidence of internal hernia or small bowel volvulus by prior CT.  Prior CT demonstrated focal wall thickening of a small bowel loop at the point of obstruction in the central pelvis, cannot exclude edema or mass.  No definite bowel wall thickening or gross free intraperitoneal air by supine radiograph.  Bones unremarkable.  IMPRESSION: Nasogastric tube projects over proximal stomach.  Persistent gaseous distension of a small bowel loop in the upper mid abdomen, up to 9.3 cm diameter, question atypical partial small bowel obstruction or potentially an adynamic/atonic segment of small bowel as result of prior surgery.  No definite evidence of volvulus or internal hernia by CT.  Recommend continued followup.   Electronically Signed   By: Ulyses Southward  M.D.   On: 03/22/2014 15:05    Medications: Scheduled Meds: . bisacodyl  10 mg Rectal Once  . bisacodyl  10 mg Rectal Once  . heparin subcutaneous  5,000 Units Subcutaneous 3 times per day  . ipratropium  2 spray Nasal QID  . LORazepam  2 mg Intravenous Once  . mometasone-formoterol  2 puff Inhalation BID  . pantoprazole (PROTONIX) IV  40 mg Intravenous Q12H  . sertraline  100 mg Oral Daily  . sodium chloride  3 mL Intravenous Q12H  . tiotropium  18 mcg Inhalation Daily   Time spent 25 minutes   LOS: 2 days   Sukari Grist M.D. [facility] 03/23/2014, 9:34 AM Pager: [phone]  If 7PM-7AM, please contact night-coverage [email or web address] Password [email or web address]

## 2014-03-23 NOTE — Evaluation (Addendum)
Physical Therapy Evaluation Patient Details Name: [name] MRN: [id number] DOB: 1960/05/23 Today's Date: 03/23/2014   History of Present Illness  Pt adm with SBO. PMH - TKA in 2014, multiple abd surgeries.  Clinical Impression  Pt doing well with mobility and no further PT needed. Pt should amb in halls several times a day. May need a staff member to push her IV pole but she doesn't need skilled therapy intervention.      Follow Up Recommendations No PT follow up    Equipment Recommendations  None recommended by PT    Recommendations for Other Services       Precautions / Restrictions Precautions Precautions: None      Mobility  Bed Mobility                  Transfers Overall transfer level: Independent                  Ambulation/Gait Ambulation/Gait assistance: Modified independent (Device/Increase time);Independent Ambulation Distance (Feet): 225 Feet Assistive device: None;Rolling walker (2 wheeled) Gait Pattern/deviations: Step-through pattern     General Gait Details: Pt amb without difficulty. Only needed help with IV pole. Used walker due to recent phenergan made her a little woozy. Amb fine without walker as well.  Stairs            Wheelchair Mobility    Modified Rankin (Stroke Patients Only)       Balance Overall balance assessment: No apparent balance deficits (not formally assessed)                                           Pertinent Vitals/Pain Pain Assessment: No/denies pain    Home Living Family/patient expects to be discharged to:: Private residence Living Arrangements: Other relatives             Home Equipment: None Additional Comments: she gave away her rolling walker from her knee surgery.    Prior Function Level of Independence: Independent               Hand Dominance        Extremity/Trunk Assessment   Upper Extremity Assessment: Overall WFL for tasks assessed            Lower Extremity Assessment: Overall WFL for tasks assessed         Communication   Communication: No difficulties  Cognition Arousal/Alertness: Awake/alert Behavior During Therapy: WFL for tasks assessed/performed Overall Cognitive Status: Within Functional Limits for tasks assessed                      General Comments      Exercises        Assessment/Plan    PT Assessment Patent does not need any further PT services  PT Diagnosis Difficulty walking   PT Problem List    PT Treatment Interventions     PT Goals (Current goals can be found in the Care Plan section) Acute Rehab PT Goals PT Goal Formulation: All assessment and education complete, DC therapy    Frequency     Barriers to discharge        Co-evaluation               End of Session   Activity Tolerance: Patient tolerated treatment well Patient left: in chair;with call bell/phone within reach;with nursing/sitter in room  Nurse Communication: Mobility status         Time: 4098-1191 PT Time Calculation (min) (ACUTE ONLY): 10 min   Charges:   PT Evaluation $Initial PT Evaluation Tier I: 1 Procedure     PT G Codes:        Latona Krichbaum 03/31/2014, 3:29 PM  [facility] PT [phone]

## 2014-03-24 ENCOUNTER — Inpatient Hospital Stay (HOSPITAL_COMMUNITY): Payer: Medicare HMO

## 2014-03-24 LAB — BASIC METABOLIC PANEL
Anion gap: 7 (ref 5–15)
BUN: 6 mg/dL (ref 6–23)
CHLORIDE: 110 mmol/L (ref 96–112)
CO2: 24 mmol/L (ref 19–32)
Calcium: 8.8 mg/dL (ref 8.4–10.5)
Creatinine, Ser: 0.75 mg/dL (ref 0.50–1.10)
GFR calc Af Amer: >90 mL/min (ref >90)
GLUCOSE: 66 mg/dL — AB (ref 70–99)
POTASSIUM: 3.5 mmol/L (ref 3.5–5.1)
Sodium: 141 mmol/L (ref 135–145)

## 2014-03-24 MED ORDER — POTASSIUM CHLORIDE CRYS ER 20 MEQ PO TBCR: 20.0000 meq | EXTENDED_RELEASE_TABLET | Freq: Two times a day (BID) | ORAL | Status: DC

## 2014-03-24 MED ORDER — POTASSIUM CHLORIDE 20 MEQ/15ML (10%) PO SOLN
20.0000 meq | Freq: Two times a day (BID) | ORAL | Status: AC
  Administered 2014-03-24: 20 meq via ORAL
  Filled 2014-03-24 (×2): qty 15

## 2014-03-24 MED ORDER — BISACODYL 10 MG RE SUPP
10.0000 mg | Freq: Once | RECTAL | Status: DC
Start: 2014-03-24 — End: 2014-03-27
  Filled 2014-03-24: qty 1

## 2014-03-24 MED ORDER — DEXTROSE-NACL 5-0.45 % IV SOLN
INTRAVENOUS | Status: DC
  Administered 2014-03-24 – 2014-03-26 (×6): via INTRAVENOUS

## 2014-03-24 NOTE — Progress Notes (Signed)
Subjective: She feels better, some flatus and small amount of  liquid stool yesterday. No distension or pain noted by pt.she was ambulated some yesterday.  Objective: Vital signs in last 24 hours: Temp:  [97.5 F (36.4 C)-98.5 F (36.9 C)] 98.4 F (36.9 C) (01/31 0535) Pulse Rate:  [80-92] 81 (01/31 0535) Resp:  [18] 18 (01/31 0535) BP: (100-128)/(46-69) 103/48 mmHg (01/31 0535) SpO2:  [97 %-100 %] 100 % (01/31 0535) Weight:  [89.7 kg (197 lb 12 oz)] 89.7 kg (197 lb 12 oz) (01/31 0535) Last BM Date: 03/23/14 700 from the NG NPO except sips Afebrile, VSS K+ 3.5 Film yesterday:  Stable persistent marked dilatation of a small bowel loop at the right side of the abdomen. The dilated segment appears to be shorter than on the prior study. As contrast is seen filling the colon, this is unlikely to reflect bowel obstruction. It may reflect an adynamic segment of small bowel, as a result of prior surgery. Intake/Output from previous day: 01/30 0701 - 01/31 0700 In: 1140 [I.V.:1100; NG/GT:40] Out: 3200 [Urine:2500; Emesis/NG output:700] Intake/Output this shift:    General appearance: alert, cooperative and no distress GI: soft, non-tender; bowel sounds normal; no masses,  no organomegaly  Lab Results:   Recent Labs  03/21/14 1908 03/23/14 0602  WBC 5.8 4.6  HGB 13.6 11.5*  HCT 41.3 35.2*  PLT 242 205    BMET  Recent Labs  03/23/14 0602 03/24/14 0305  NA 135 141  K 3.9 3.5  CL 104 110  CO2 24 24  GLUCOSE 62* 66*  BUN <5* 6  CREATININE 0.63 0.75  CALCIUM 8.8 8.8   PT/INR No results for input(s): LABPROT, INR in the last 72 hours.   Recent Labs Lab 03/21/14 1908  AST 28  ALT 16  ALKPHOS 77  BILITOT 0.6  PROT 7.6  ALBUMIN 4.3     Lipase     Component Value Date/Time   LIPASE 26 03/21/2014 1908     Studies/Results: Dg Abd 2 Views  03/23/2014   CLINICAL DATA:  Follow-up small bowel obstruction. Diarrhea and vomiting for 2 days. Subsequent  encounter.  EXAM: ABDOMEN - 2 VIEW  COMPARISON:  Abdominal radiograph from 03/22/2014  FINDINGS: There is persistent marked dilatation of a small bowel loop at the right side of the abdomen, measuring up to 10.3 cm in diameter. The dilated segment appears slightly shorter than on the prior study, though it remains equally dilated. As contrast has made it into the colon, this is less likely to reflect bowel obstruction. It may reflect an adynamic segment of small bowel, as a result of prior surgery. The colon is unremarkable in appearance.  No free intra-abdominal air is identified. An enteric tube is seen ending overlying the left upper quadrant. The visualized lung bases are grossly clear. No acute osseous abnormalities are seen.  IMPRESSION: Stable persistent marked dilatation of a small bowel loop at the right side of the abdomen. The dilated segment appears to be shorter than on the prior study. As contrast is seen filling the colon, this is unlikely to reflect bowel obstruction. It may reflect an adynamic segment of small bowel, as a result of prior surgery.   Electronically Signed   By: Roanna RaiderJeffery  Chang M.D.   On: 03/23/2014 08:46   Dg Abd Portable 1v  03/22/2014   CLINICAL DATA:  Nasogastric tube placement  EXAM: PORTABLE ABDOMEN - 1 VIEW  COMPARISON:  Portable exam 1349 hr compared to CT abdomen and  pelvis of 03/22/2014  FINDINGS: Nasogastric tube projects over proximal stomach.  Retained contrast in colon indicating a lack of complete bowel obstruction since administration of GI contrast earlier same date.  Persistent significant gaseous distention of a small bowel loop in the upper mid abdomen, uncertain etiology.  This appearance is atypical for a standard small bowel obstruction due to adhesions in that several significantly dilated loops are present in the mid small bowel with normal caliber loops more proximally.  This could represent an atypical small bowel obstruction or an adynamic / atonic segment  of small bowel as result of prior surgery.  No definite evidence of internal hernia or small bowel volvulus by prior CT.  Prior CT demonstrated focal wall thickening of a small bowel loop at the point of obstruction in the central pelvis, cannot exclude edema or mass.  No definite bowel wall thickening or gross free intraperitoneal air by supine radiograph.  Bones unremarkable.  IMPRESSION: Nasogastric tube projects over proximal stomach.  Persistent gaseous distension of a small bowel loop in the upper mid abdomen, up to 9.3 cm diameter, question atypical partial small bowel obstruction or potentially an adynamic/atonic segment of small bowel as result of prior surgery.  No definite evidence of volvulus or internal hernia by CT.  Recommend continued followup.   Electronically Signed   By: Ulyses Southward M.D.   On: 03/22/2014 15:05    Medications: . bisacodyl  10 mg Rectal Once  . heparin subcutaneous  5,000 Units Subcutaneous 3 times per day  . ipratropium  2 spray Nasal QID  . LORazepam  2 mg Intravenous Once  . mometasone-formoterol  2 puff Inhalation BID  . pantoprazole (PROTONIX) IV  40 mg Intravenous Q12H  . sertraline  100 mg Oral Daily  . sodium chloride  3 mL Intravenous Q12H  . tiotropium  18 mcg Inhalation Daily    Assessment/Plan SBO/prior hysterectomy/bowel resection/multipe abdominal surgeries Chronic pain with narcotic use at home Constipation with narcotic use - she is on Miralax at home COPD Hypertension S/p Knee replacement    Plan:  She is improving.  Contrast yesterday thru most of the colon.  NG worked yesterday, but not on Friday.  No nausea.  Dr. Isidoro Donning has ordered another film.  I would be in favor of clamping NG and giving her some clear liquids and see how she does.  I will start clamping trials while we wait for film.  I would also try to get K+ up and will give her some today.  Dr. Isidoro Donning talked with [facility] yesterday and Medicine over there see's no reason for her to be  transferred there.  Dr Isidoro Donning is asking if we could talk with surgery.  Wake is reported to be at max census also with no bed.    LOS: 3 days    Ramell Wacha 03/24/2014

## 2014-03-24 NOTE — Progress Notes (Signed)
Patient ID: [name]  female  ZOX:[id number]    DOB: [birth year]    DOA: 03/21/2014  PCP: Diamantina Providence., FNP  Brief history of present illness and  Patient is a 54 year old female with history of recurrent SBO, hysterectomy, COPD, hypertension, hyperlipidemia, GERD presented with nausea, vomiting, abdominal pain for last 2 days prior to admission. Patient had slight diarrhea, liquidy stools. Otherwise denied any fevers, chills or rectal bleeding. Patient was admitted for partial SBO.   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 - CT abd and pelvis showed marked dilatation of small bowel segment along the right side of the abdomen up to 10 cm, new from the prior study. New mesenteric nodes measuring up to 1.2 cm in short axis, an underlying mass cannot be excluded recommended PET CT, cholelithiasis otherwise gallbladder unremarkable, apparent wall thickening along the distal rectum. - Cont NPO, NGT, management per surgery, abdominal x-ray this morning showed slightly less prominent dilated small bowel loop findings still concerning for ileus or partial SBO. ? Clamp NG - Patient reports small BM after suppository yesterday, added another dulcolax PR today - Discussed with Dr Allayne Butcher (general surgery) at [facility] today, recommended to call back if conservative management is failing, will defer to surgery attending evaluation today.   Active Problems:   COPD (chronic obstructive pulmonary disease) - Currently stable, no wheezing, continue albuterol, Spiriva, dulera    Hypertension - BP currently stable continue hydralazine as needed    GERD (gastroesophageal reflux disease) - Placed on IV PPI    Hyperlipidemia - Hold statins    Osteoarthritis - Continue IV pain  medications as needed  DVT Prophylaxis: heparin sq  Code Status: FC  Family Communication:  Disposition:  Consultants:  surgery  Procedures:  CT abd pelvis  Antibiotics:  None    Subjective: No abdominal pain, reports small BM after suppository yesterday, no nausea or vomiting, fevers or chills  Objective: Weight change: -1.181 kg (-2 lb 9.7 oz)  Intake/Output Summary (Last 24 hours) at 03/24/14 1008 Last data filed at 03/24/14 0900  Gross per 24 hour  Intake    800 ml  Output   3000 ml  Net  -2200 ml   Blood pressure 103/48, pulse 81, temperature 98.4 F (36.9 C), temperature source Oral, resp. rate 18, height  (1.575 m), weight 89.7 kg (197 lb 12 oz), SpO2 100 %.  Physical Exam: General: A x O x3, NAD CVS: S1-S2 clear, no murmur rubs or gallops Chest: CTAB Abdomen: soft, NT, ND, bs+  Extremities: no c/c/e bilaterally Neuro: Cranial nerves II-XII intact, no focal neurological deficits  Lab Results: Basic Metabolic Panel:  Recent Labs Lab 03/23/14 0602 03/24/14 0305  NA 135 141  K 3.9 3.5  CL 104 110  CO2 24 24  GLUCOSE 62* 66*  BUN <5* 6  CREATININE 0.63 0.75  CALCIUM 8.8 8.8   Liver Function Tests:  Recent Labs Lab 03/21/14 1908  AST 28  ALT 16  ALKPHOS 77  BILITOT 0.6  PROT 7.6  ALBUMIN 4.3    Recent Labs Lab 03/21/14 1908  LIPASE 26   No results for input(s): AMMONIA in the last 168 hours. CBC:  Recent Labs Lab 03/21/14 1908 03/23/14 0602  WBC 5.8 4.6  NEUTROABS 2.9  --   HGB 13.6 11.5*  HCT  41.3 35.2*  MCV 86.2 86.9  PLT 242 205   Cardiac Enzymes: No results for input(s): CKTOTAL, CKMB, CKMBINDEX, TROPONINI in the last 168 hours. BNP: Invalid input(s): POCBNP CBG: No results for input(s): GLUCAP in the last 168 hours.   Micro Results: Recent Results (from the past 240 hour(s))  MRSA PCR Screening     Status: None   Collection Time: 03/22/14  3:24 AM  Result Value Ref Range Status   MRSA by PCR  NEGATIVE NEGATIVE Final    Comment:        The GeneXpert MRSA Assay (FDA approved for NASAL specimens only), is one component of a comprehensive MRSA colonization surveillance program. It is not intended to diagnose MRSA infection nor to guide or monitor treatment for MRSA infections.     Studies/Results: Ct Abdomen Pelvis W Contrast  03/22/2014   CLINICAL DATA:  Acute onset of generalized abdominal pain. Initial encounter.  EXAM: CT ABDOMEN AND PELVIS WITH CONTRAST  TECHNIQUE: Multidetector CT imaging of the abdomen and pelvis was performed using the standard protocol following bolus administration of intravenous contrast.  CONTRAST:  OMNIPAQUE IOHEXOL 300 MG/ML  SOLN  COMPARISON:  CT of the abdomen and pelvis performed 08/13/2007  FINDINGS: Focal bronchiectasis is noted within the right lower lobe.  The liver and spleen are unremarkable in appearance. Stones are seen dependently within the gallbladder; the gallbladder is otherwise unremarkable. The pancreas and adrenal glands are unremarkable.  The kidneys are unremarkable in appearance. There is no evidence of hydronephrosis. No renal or ureteral stones are seen. No perinephric stranding is appreciated.  There is marked dilatation of an unusual small bowel segment along the right side of the abdomen, measuring up to 10.0 cm. This is of uncertain significance, and may reflect prior surgery, with an apparent bowel suture line partially imaged. Contrast does not pass across this segment, possibly reflecting significant associated dysmotility. Just proximal to this segment, there is mild focal wall thickening along the proximal ileum. There is relative decompression of more distal small bowel loops, though a small amount of fluid is still seen within the majority of these loops. An underlying adhesion causing partial small-bowel obstruction cannot be excluded. The distal ileum is decompressed, to the level of the ileocecal junction.  No free  fluid is identified. The stomach is within normal limits. No acute vascular abnormalities are seen. Minimal calcification is seen along the common iliac arteries bilaterally. There is new prominence of mesenteric nodes, measuring up to 1.2 cm in short axis.  Mild scarring is noted along the anterior abdominal wall, reflecting prior surgery.  The patient may be status post appendectomy, as a small focus of increased attenuation is noted at the cecum. There is no evidence of appendicitis. The colon is unremarkable in appearance. There is apparent wall thickening along the distal rectum; this may simply reflect chronic inflammation or intraluminal contents, though depending on the patient's symptoms, sigmoidoscopy could be considered for further evaluation.  The bladder is mildly distended and grossly unremarkable. The patient is status post hysterectomy. No suspicious adnexal masses are seen. No inguinal lymphadenopathy is seen.  No acute osseous abnormalities are identified.  IMPRESSION: 1. Marked dilatation of an unusual small bowel segment along the right side of the abdomen, measuring up to 10.0 cm. Dilatation is new from the prior study. Some of this may reflect prior surgery, with an apparent associated bowel suture line. Contrast does not pass across this segment, possibly reflecting significant associated dysmotility. There is  mild focal wall thickening along the proximal ileum just proximal to this segment, and relative decompression of more distal small bowel loops, though a small amount of fluid is still seen within the majority of the distal loops. Cannot exclude underlying adhesion, causing partial small bowel obstruction. 2. New prominence of mesenteric nodes, measuring up to 1.2 cm in short axis. An underlying mass cannot be excluded, but is not well assessed on this study. If the patient's symptoms persist, PET/CT could be considered for further evaluation. 3. Cholelithiasis; gallbladder otherwise  unremarkable. 4. Apparent wall thickening along the distal rectum. This may simply reflect chronic inflammation or intraluminal contents, though depending on the patient's symptoms, sigmoidoscopy could be considered for further evaluation. 5. Focal bronchiectasis at the right lower lung lobe.   Electronically Signed   By: Roanna RaiderJeffery  Chang M.D.   On: 03/22/2014 00:50   Dg Abd 2 Views  03/24/2014   CLINICAL DATA:  Small bowel obstruction.  EXAM: ABDOMEN - 2 VIEW  COMPARISON:  March 23, 2014.  FINDINGS: Residual contrast remains with right and transverse colon. Nasogastric tube tip is seen in proximal stomach. No pneumoperitoneum is noted. Dilated loop of small bowel noted on prior exam remains, but is slightly less prominent.  IMPRESSION: Slightly less prominent dilated small bowel loop is seen in central portion of abdomen, but findings are still concerning for ileus or partial small bowel obstruction.   Electronically Signed   By: Roque LiasJames  Green M.D.   On: 03/24/2014 09:03   Dg Abd 2 Views  03/23/2014   CLINICAL DATA:  Follow-up small bowel obstruction. Diarrhea and vomiting for 2 days. Subsequent encounter.  EXAM: ABDOMEN - 2 VIEW  COMPARISON:  Abdominal radiograph from 03/22/2014  FINDINGS: There is persistent marked dilatation of a small bowel loop at the right side of the abdomen, measuring up to 10.3 cm in diameter. The dilated segment appears slightly shorter than on the prior study, though it remains equally dilated. As contrast has made it into the colon, this is less likely to reflect bowel obstruction. It may reflect an adynamic segment of small bowel, as a result of prior surgery. The colon is unremarkable in appearance.  No free intra-abdominal air is identified. An enteric tube is seen ending overlying the left upper quadrant. The visualized lung bases are grossly clear. No acute osseous abnormalities are seen.  IMPRESSION: Stable persistent marked dilatation of a small bowel loop at the right side  of the abdomen. The dilated segment appears to be shorter than on the prior study. As contrast is seen filling the colon, this is unlikely to reflect bowel obstruction. It may reflect an adynamic segment of small bowel, as a result of prior surgery.   Electronically Signed   By: Roanna RaiderJeffery  Chang M.D.   On: 03/23/2014 08:46   Dg Abd Portable 1v  03/22/2014   CLINICAL DATA:  Nasogastric tube placement  EXAM: PORTABLE ABDOMEN - 1 VIEW  COMPARISON:  Portable exam 1349 hr compared to CT abdomen and pelvis of 03/22/2014  FINDINGS: Nasogastric tube projects over proximal stomach.  Retained contrast in colon indicating a lack of complete bowel obstruction since administration of GI contrast earlier same date.  Persistent significant gaseous distention of a small bowel loop in the upper mid abdomen, uncertain etiology.  This appearance is atypical for a standard small bowel obstruction due to adhesions in that several significantly dilated loops are present in the mid small bowel with normal caliber loops more proximally.  This could  represent an atypical small bowel obstruction or an adynamic / atonic segment of small bowel as result of prior surgery.  No definite evidence of internal hernia or small bowel volvulus by prior CT.  Prior CT demonstrated focal wall thickening of a small bowel loop at the point of obstruction in the central pelvis, cannot exclude edema or mass.  No definite bowel wall thickening or gross free intraperitoneal air by supine radiograph.  Bones unremarkable.  IMPRESSION: Nasogastric tube projects over proximal stomach.  Persistent gaseous distension of a small bowel loop in the upper mid abdomen, up to 9.3 cm diameter, question atypical partial small bowel obstruction or potentially an adynamic/atonic segment of small bowel as result of prior surgery.  No definite evidence of volvulus or internal hernia by CT.  Recommend continued followup.   Electronically Signed   By: Ulyses Southward M.D.   On:  03/22/2014 15:05    Medications: Scheduled Meds: . bisacodyl  10 mg Rectal Once  . heparin subcutaneous  5,000 Units Subcutaneous 3 times per day  . ipratropium  2 spray Nasal QID  . LORazepam  2 mg Intravenous Once  . mometasone-formoterol  2 puff Inhalation BID  . pantoprazole (PROTONIX) IV  40 mg Intravenous Q12H  . potassium chloride SA  20 mEq Oral BID  . sertraline  100 mg Oral Daily  . sodium chloride  3 mL Intravenous Q12H  . tiotropium  18 mcg Inhalation Daily   Time spent 25 minutes   LOS: 3 days   RAI,RIPUDEEP M.D. [facility] 03/24/2014, 10:08 AM Pager: [phone]  If 7PM-7AM, please contact night-coverage [email or web address] Password [email or web address]

## 2014-03-25 ENCOUNTER — Inpatient Hospital Stay (HOSPITAL_COMMUNITY): Payer: Medicare HMO

## 2014-03-25 LAB — BASIC METABOLIC PANEL
ANION GAP: 7 (ref 5–15)
BUN: <5 mg/dL — ABNORMAL LOW (ref 6–23)
CHLORIDE: 105 mmol/L (ref 96–112)
CO2: 28 mmol/L (ref 19–32)
CREATININE: 0.72 mg/dL (ref 0.50–1.10)
Calcium: 8.9 mg/dL (ref 8.4–10.5)
GFR calc Af Amer: >90 mL/min (ref >90)
GFR calc non Af Amer: >90 mL/min (ref >90)
GLUCOSE: 90 mg/dL (ref 70–99)
POTASSIUM: 3.1 mmol/L — AB (ref 3.5–5.1)
Sodium: 140 mmol/L (ref 135–145)

## 2014-03-25 MED ORDER — SENNOSIDES-DOCUSATE SODIUM 8.6-50 MG PO TABS
1.0000 | ORAL_TABLET | Freq: Every day | ORAL | Status: DC
  Administered 2014-03-25: 1 via ORAL
  Filled 2014-03-25 (×2): qty 1

## 2014-03-25 MED ORDER — POTASSIUM CHLORIDE 10 MEQ/100ML IV SOLN
10.0000 meq | INTRAVENOUS | Status: AC
Start: 2014-03-25 — End: 2014-03-25
  Administered 2014-03-25 (×3): 10 meq via INTRAVENOUS
  Filled 2014-03-25 (×3): qty 100

## 2014-03-25 MED ORDER — PANTOPRAZOLE SODIUM 40 MG PO TBEC
40.0000 mg | DELAYED_RELEASE_TABLET | Freq: Every day | ORAL | Status: DC
Start: 2014-03-25 — End: 2014-03-27
  Administered 2014-03-25 – 2014-03-27 (×3): 40 mg via ORAL
  Filled 2014-03-25 (×3): qty 1

## 2014-03-25 MED ORDER — BISACODYL 10 MG RE SUPP: 10.0000 mg | Freq: Every day | RECTAL | Status: DC

## 2014-03-25 MED ORDER — POLYETHYLENE GLYCOL 3350 17 G PO PACK
17.0000 g | PACK | Freq: Two times a day (BID) | ORAL | Status: DC
  Administered 2014-03-25 – 2014-03-26 (×3): 17 g via ORAL
  Filled 2014-03-25 (×6): qty 1

## 2014-03-25 NOTE — Progress Notes (Signed)
Patient ID: [name]  female  AVW:[id number]    DOB: June 15, 1960    DOA: 03/21/2014  PCP: Diamantina ProvidenceANDERSON,TAKELA N., FNP  Brief history of present illness and  Patient is a 54 year old female with history of recurrent SBO, hysterectomy, COPD, hypertension, hyperlipidemia, GERD presented with nausea, vomiting, abdominal pain for last 2 days prior to admission. Patient had slight diarrhea, liquidy stools. Otherwise denied any fevers, chills or rectal bleeding. Patient was admitted for partial SBO.   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 - CT abd and pelvis showed marked dilatation of small bowel segment along the right side of the abdomen up to 10 cm, new from the prior study. New mesenteric nodes measuring up to 1.2 cm in short axis, an underlying mass cannot be excluded recommended PET CT, cholelithiasis otherwise gallbladder unremarkable, apparent wall thickening along the distal rectum. - Abdominal x-ray ordered this morning, shows slight improvement in SBO, NGT discontinued, start clears, mobilize, - Placed on MiraLAX and Senokot-S (patient reports that she takes laxatives/stool softeners daily and due to her chronic opioid use for chronic back pain  Active Problems:   COPD (chronic obstructive pulmonary disease) - Currently stable, no wheezing, continue albuterol, Spiriva, dulera    Hypertension - BP currently stable continue hydralazine as needed    GERD (gastroesophageal reflux disease) - start oral PPI    Hyperlipidemia - Hold statins    Osteoarthritis - Continue IV pain medications as needed  DVT Prophylaxis: heparin sq  Code Status: FC  Family Communication:  Disposition:  Consultants:  surgery  Procedures:  CT abd pelvis  Antibiotics:  None    Subjective: No  abdominal pain, no fevers, chills, nausea or vomiting, no BM yesterday   Objective: Weight change: -0.7 kg (-1 lb 8.7 oz)  Intake/Output Summary (Last 24 hours) at 03/25/14 1021 Last data filed at 03/25/14 0749  Gross per 24 hour  Intake 1655.58 ml  Output   2250 ml  Net -594.42 ml   Blood pressure 111/53, pulse 79, temperature 97.7 F (36.5 C), temperature source Oral, resp. rate 18, height 5\' 2"  (1.575 m), weight 89 kg (196 lb 3.4 oz), SpO2 98 %.  Physical Exam: General: A x O x3, NAD CVS: S1-S2 clear Chest: CTAB Abdomen: soft, NT, ND, bs+  Extremities: no c/c/e bilaterally   Lab Results: Basic Metabolic Panel:  Recent Labs Lab 03/24/14 0305 03/25/14 0308  NA 141 140  K 3.5 3.1*  CL 110 105  CO2 24 28  GLUCOSE 66* 90  BUN 6 <5*  CREATININE 0.75 0.72  CALCIUM 8.8 8.9   Liver Function Tests:  Recent Labs Lab 03/21/14 1908  AST 28  ALT 16  ALKPHOS 77  BILITOT 0.6  PROT 7.6  ALBUMIN 4.3    Recent Labs Lab 03/21/14 1908  LIPASE 26   No results for input(s): AMMONIA in the last 168 hours. CBC:  Recent Labs Lab 03/21/14 1908 03/23/14 0602  WBC 5.8 4.6  NEUTROABS 2.9  --   HGB 13.6 11.5*  HCT 41.3 35.2*  MCV 86.2 86.9  PLT 242 205   Cardiac Enzymes: No results for input(s): CKTOTAL, CKMB, CKMBINDEX, TROPONINI in the last 168 hours. BNP: Invalid input(s): POCBNP CBG: No results for input(s): GLUCAP in the last 168 hours.   Micro Results: Recent Results (from the past 240 hour(s))  MRSA PCR Screening     Status: None   Collection Time: 03/22/14  3:24 AM  Result Value Ref Range Status   MRSA by PCR NEGATIVE NEGATIVE Final    Comment:        The GeneXpert MRSA Assay (FDA approved for NASAL specimens only), is one component of a comprehensive MRSA colonization surveillance program. It is not intended to diagnose MRSA infection nor to guide or monitor treatment for MRSA infections.     Studies/Results: Ct Abdomen Pelvis W  Contrast  03/22/2014   CLINICAL DATA:  Acute onset of generalized abdominal pain. Initial encounter.  EXAM: CT ABDOMEN AND PELVIS WITH CONTRAST  TECHNIQUE: Multidetector CT imaging of the abdomen and pelvis was performed using the standard protocol following bolus administration of intravenous contrast.  CONTRAST:  OMNIPAQUE IOHEXOL 300 MG/ML  SOLN  COMPARISON:  CT of the abdomen and pelvis performed 08/13/2007  FINDINGS: Focal bronchiectasis is noted within the right lower lobe.  The liver and spleen are unremarkable in appearance. Stones are seen dependently within the gallbladder; the gallbladder is otherwise unremarkable. The pancreas and adrenal glands are unremarkable.  The kidneys are unremarkable in appearance. There is no evidence of hydronephrosis. No renal or ureteral stones are seen. No perinephric stranding is appreciated.  There is marked dilatation of an unusual small bowel segment along the right side of the abdomen, measuring up to 10.0 cm. This is of uncertain significance, and may reflect prior surgery, with an apparent bowel suture line partially imaged. Contrast does not pass across this segment, possibly reflecting significant associated dysmotility. Just proximal to this segment, there is mild focal wall thickening along the proximal ileum. There is relative decompression of more distal small bowel loops, though a small amount of fluid is still seen within the majority of these loops. An underlying adhesion causing partial small-bowel obstruction cannot be excluded. The distal ileum is decompressed, to the level of the ileocecal junction.  No free fluid is identified. The stomach is within normal limits. No acute vascular abnormalities are seen. Minimal calcification is seen along the common iliac arteries bilaterally. There is new prominence of mesenteric nodes, measuring up to 1.2 cm in short axis.  Mild scarring is noted along the anterior abdominal wall, reflecting prior surgery.   The patient may be status post appendectomy, as a small focus of increased attenuation is noted at the cecum. There is no evidence of appendicitis. The colon is unremarkable in appearance. There is apparent wall thickening along the distal rectum; this may simply reflect chronic inflammation or intraluminal contents, though depending on the patient's symptoms, sigmoidoscopy could be considered for further evaluation.  The bladder is mildly distended and grossly unremarkable. The patient is status post hysterectomy. No suspicious adnexal masses are seen. No inguinal lymphadenopathy is seen.  No acute osseous abnormalities are identified.  IMPRESSION: 1. Marked dilatation of an unusual small bowel segment along the right side of the abdomen, measuring up to 10.0 cm. Dilatation is new from the prior study. Some of this may reflect prior surgery, with an apparent associated bowel suture line. Contrast does not pass across this segment, possibly reflecting significant associated dysmotility. There is mild focal wall thickening along the proximal ileum just proximal to this segment, and relative decompression of more distal small bowel loops, though a small amount of fluid is still seen within the majority of the distal loops. Cannot exclude underlying adhesion, causing partial small bowel obstruction. 2. New prominence of mesenteric nodes, measuring  up to 1.2 cm in short axis. An underlying mass cannot be excluded, but is not well assessed on this study. If the patient's symptoms persist, PET/CT could be considered for further evaluation. 3. Cholelithiasis; gallbladder otherwise unremarkable. 4. Apparent wall thickening along the distal rectum. This may simply reflect chronic inflammation or intraluminal contents, though depending on the patient's symptoms, sigmoidoscopy could be considered for further evaluation. 5. Focal bronchiectasis at the right lower lung lobe.   Electronically Signed   By: Roanna Raider M.D.    On: 03/22/2014 00:50   Dg Abd 2 Views  03/25/2014   CLINICAL DATA:  Small bowel obstruction.  EXAM: ABDOMEN - 2 VIEW  COMPARISON:  CT abdomen and pelvis 03/22/2014. Plain films the abdomen 03/22/2014 and 03/24/2014.  FINDINGS: NGT remains in place in good position. Contrast is again seen throughout the colon. Distended loop of small bowel in the mid abdomen measures approximately 7.6 cm compared to 9.3 cm on the most recent exam. No new abnormality is identified.  IMPRESSION: Slight decrease in gaseous distention of a loop of small bowel in the mid abdomen which may reflect an atonic segment or possibly partial small bowel obstruction.   Electronically Signed   By: Drusilla Kanner M.D.   On: 03/25/2014 08:14   Dg Abd 2 Views  03/24/2014   CLINICAL DATA:  Small bowel obstruction.  EXAM: ABDOMEN - 2 VIEW  COMPARISON:  March 23, 2014.  FINDINGS: Residual contrast remains with right and transverse colon. Nasogastric tube tip is seen in proximal stomach. No pneumoperitoneum is noted. Dilated loop of small bowel noted on prior exam remains, but is slightly less prominent.  IMPRESSION: Slightly less prominent dilated small bowel loop is seen in central portion of abdomen, but findings are still concerning for ileus or partial small bowel obstruction.   Electronically Signed   By: Roque Lias M.D.   On: 03/24/2014 09:03   Dg Abd 2 Views  03/23/2014   CLINICAL DATA:  Follow-up small bowel obstruction. Diarrhea and vomiting for 2 days. Subsequent encounter.  EXAM: ABDOMEN - 2 VIEW  COMPARISON:  Abdominal radiograph from 03/22/2014  FINDINGS: There is persistent marked dilatation of a small bowel loop at the right side of the abdomen, measuring up to 10.3 cm in diameter. The dilated segment appears slightly shorter than on the prior study, though it remains equally dilated. As contrast has made it into the colon, this is less likely to reflect bowel obstruction. It may reflect an adynamic segment of small bowel, as  a result of prior surgery. The colon is unremarkable in appearance.  No free intra-abdominal air is identified. An enteric tube is seen ending overlying the left upper quadrant. The visualized lung bases are grossly clear. No acute osseous abnormalities are seen.  IMPRESSION: Stable persistent marked dilatation of a small bowel loop at the right side of the abdomen. The dilated segment appears to be shorter than on the prior study. As contrast is seen filling the colon, this is unlikely to reflect bowel obstruction. It may reflect an adynamic segment of small bowel, as a result of prior surgery.   Electronically Signed   By: Roanna Raider M.D.   On: 03/23/2014 08:46   Dg Abd Portable 1v  03/22/2014   CLINICAL DATA:  Nasogastric tube placement  EXAM: PORTABLE ABDOMEN - 1 VIEW  COMPARISON:  Portable exam 1349 hr compared to CT abdomen and pelvis of 03/22/2014  FINDINGS: Nasogastric tube projects over proximal stomach.  Retained  contrast in colon indicating a lack of complete bowel obstruction since administration of GI contrast earlier same date.  Persistent significant gaseous distention of a small bowel loop in the upper mid abdomen, uncertain etiology.  This appearance is atypical for a standard small bowel obstruction due to adhesions in that several significantly dilated loops are present in the mid small bowel with normal caliber loops more proximally.  This could represent an atypical small bowel obstruction or an adynamic / atonic segment of small bowel as result of prior surgery.  No definite evidence of internal hernia or small bowel volvulus by prior CT.  Prior CT demonstrated focal wall thickening of a small bowel loop at the point of obstruction in the central pelvis, cannot exclude edema or mass.  No definite bowel wall thickening or gross free intraperitoneal air by supine radiograph.  Bones unremarkable.  IMPRESSION: Nasogastric tube projects over proximal stomach.  Persistent gaseous distension of  a small bowel loop in the upper mid abdomen, up to 9.3 cm diameter, question atypical partial small bowel obstruction or potentially an adynamic/atonic segment of small bowel as result of prior surgery.  No definite evidence of volvulus or internal hernia by CT.  Recommend continued followup.   Electronically Signed   By: Ulyses Southward M.D.   On: 03/22/2014 15:05    Medications: Scheduled Meds: . bisacodyl  10 mg Rectal Once  . bisacodyl  10 mg Rectal Daily  . heparin subcutaneous  5,000 Units Subcutaneous 3 times per day  . ipratropium  2 spray Nasal QID  . LORazepam  2 mg Intravenous Once  . mometasone-formoterol  2 puff Inhalation BID  . pantoprazole (PROTONIX) IV  40 mg Intravenous Q12H  . potassium chloride  10 mEq Intravenous Q1 Hr x 3  . sertraline  100 mg Oral Daily  . sodium chloride  3 mL Intravenous Q12H  . tiotropium  18 mcg Inhalation Daily   Time spent 25 minutes   LOS: 4 days   RAI,RIPUDEEP M.D. [facility] 03/25/2014, 10:21 AM Pager: [phone]  If 7PM-7AM, please contact night-coverage [email or web address] Password [email or web address]

## 2014-03-25 NOTE — Progress Notes (Signed)
Patient ID: [name], female   DOB: [birth year], 54 y.o.   MRN: [id number]     [facility]      [address] [place], [place] [address]    Phone: [phone] FAX: [phone]     Subjective: No n/v.  Passing flatus, had BM.    Objective:  Vital signs:  Filed Vitals:   03/24/14 1958 03/24/14 2000 03/25/14 0218 03/25/14 0544  BP:  110/72 137/69 125/67  Pulse:  77 77 73  Temp:  97.8 F (36.6 C) 98.2 F (36.8 C) 98.2 F (36.8 C)  TempSrc:  Oral Oral Oral  Resp:  _0 Height:      Weight:    196 lb 3.4 oz (89 kg)  SpO2: 100% 98% 98% 98%    Last BM Date: 03/23/14  Intake/Output   Yesterday:  01/31 0701 - 02/01 0700 In: 1336.8 [I.V.:1336.8] Out: 1325 [Urine:950; Emesis/NG output:375] This shift:  Total I/O In: 718.8 [I.V.:718.8] Out: 1000 [Urine:600; Emesis/NG output:400]  Physical Exam: General: Pt awake/alert/oriented x4 in no acute distress  Abdomen: Soft.  Nondistended.  Non tender.  No evidence of peritonitis.  No incarcerated hernias.    Problem List:   Principal Problem:   Partial small bowel obstruction Active Problems:   Abdominal pain   COPD (chronic obstructive pulmonary disease)   Hypertension   GERD (gastroesophageal reflux disease)   Hyperlipidemia   Osteoarthritis    Results:   Labs: Results for orders placed or performed during the hospital encounter of 03/21/14 (from the past 48 hour(s))  Basic metabolic panel     Status: Abnormal   Collection Time: 03/24/14  3:05 AM  Result Value Ref Range   Sodium 141 135 - 145 mmol/L   Potassium 3.5 3.5 - 5.1 mmol/L   Chloride 110 96 - 112 mmol/L   CO2 24 19 - 32 mmol/L   Glucose, Bld 66 (L) 70 - 99 mg/dL   BUN 6 6 - 23 mg/dL   Creatinine, Ser 0.75 0.50 - 1.10 mg/dL   Calcium 8.8 8.4 - 10.5 mg/dL   GFR calc non Af Amer >90 >90 mL/min   GFR calc Af Amer >90 >90 mL/min    Comment: (NOTE) The eGFR has been calculated using the CKD EPI  equation. This calculation has not been validated in all clinical situations. eGFR's persistently <90 mL/min signify possible Chronic Kidney Disease.    Anion gap 7 5 - 15  Basic metabolic panel     Status: Abnormal   Collection Time: 03/25/14  3:08 AM  Result Value Ref Range   Sodium 140 135 - 145 mmol/L   Potassium 3.1 (L) 3.5 - 5.1 mmol/L   Chloride 105 96 - 112 mmol/L   CO2 28 19 - 32 mmol/L   Glucose, Bld 90 70 - 99 mg/dL   BUN <5 (L) 6 - 23 mg/dL    Comment: REPEATED TO VERIFY   Creatinine, Ser 0.72 0.50 - 1.10 mg/dL   Calcium 8.9 8.4 - 10.5 mg/dL   GFR calc non Af Amer >90 >90 mL/min   GFR calc Af Amer >90 >90 mL/min    Comment: (NOTE) The eGFR has been calculated using the CKD EPI equation. This calculation has not been validated in all clinical situations. eGFR's persistently <90 mL/min signify possible Chronic Kidney Disease.    Anion gap 7 5 - 15    Imaging / Studies: Dg Abd 2 Views  03/25/2014  CLINICAL DATA:  Small bowel obstruction.  EXAM: ABDOMEN - 2 VIEW  COMPARISON:  CT abdomen and pelvis 03/22/2014. Plain films the abdomen 03/22/2014 and 03/24/2014.  FINDINGS: NGT remains in place in good position. Contrast is again seen throughout the colon. Distended loop of small bowel in the mid abdomen measures approximately 7.6 cm compared to 9.3 cm on the most recent exam. No new abnormality is identified.  IMPRESSION: Slight decrease in gaseous distention of a loop of small bowel in the mid abdomen which may reflect an atonic segment or possibly partial small bowel obstruction.   Electronically Signed   By: Inge Rise M.D.   On: 03/25/2014 08:14   Dg Abd 2 Views  03/24/2014   CLINICAL DATA:  Small bowel obstruction.  EXAM: ABDOMEN - 2 VIEW  COMPARISON:  March 23, 2014.  FINDINGS: Residual contrast remains with right and transverse colon. Nasogastric tube tip is seen in proximal stomach. No pneumoperitoneum is noted. Dilated loop of small bowel noted on prior exam  remains, but is slightly less prominent.  IMPRESSION: Slightly less prominent dilated small bowel loop is seen in central portion of abdomen, but findings are still concerning for ileus or partial small bowel obstruction.   Electronically Signed   By: Sabino Dick M.D.   On: 03/24/2014 09:03    Medications / Allergies:  Scheduled Meds: . bisacodyl  10 mg Rectal Once  . bisacodyl  10 mg Rectal Daily  . heparin subcutaneous  5,000 Units Subcutaneous 3 times per day  . ipratropium  2 spray Nasal QID  . LORazepam  2 mg Intravenous Once  . mometasone-formoterol  2 puff Inhalation BID  . pantoprazole (PROTONIX) IV  40 mg Intravenous Q12H  . potassium chloride  10 mEq Intravenous Q1 Hr x 3  . potassium chloride  20 mEq Oral BID  . sertraline  100 mg Oral Daily  . sodium chloride  3 mL Intravenous Q12H  . tiotropium  18 mcg Inhalation Daily   Continuous Infusions: . dextrose 5 % and 0.45% NaCl 75 mL/hr at 03/24/14 1800   PRN Meds:.acetaminophen **OR** acetaminophen, albuterol, hydrALAZINE, HYDROmorphone (DILAUDID) injection, LORazepam, oxyCODONE-acetaminophen, promethazine, zolpidem  Antibiotics: Anti-infectives    None        Assessment/Plan SBO/prior hysterectomy/bowel resection/multipe abdominal surgeries Chronic pain with narcotic use at home Constipation with narcotic use - she is on Miralax at home COPD Hypertension S/p Knee replacement   -DC NGT -Start clears -mobilize -if she fails conservative management, will transfer to [facility] [place], [facility] Pager [phone]) For consults and floor pages call [phone](7A-4:30P)  03/25/2014 8:52 AM

## 2014-03-26 LAB — MAGNESIUM: Magnesium: 1.7 mg/dL (ref 1.5–2.5)

## 2014-03-26 LAB — BASIC METABOLIC PANEL
ANION GAP: 6 (ref 5–15)
CO2: 28 mmol/L (ref 19–32)
Calcium: 9.1 mg/dL (ref 8.4–10.5)
Chloride: 105 mmol/L (ref 96–112)
Creatinine, Ser: 0.65 mg/dL (ref 0.50–1.10)
GFR calc Af Amer: >90 mL/min (ref >90)
GLUCOSE: 92 mg/dL (ref 70–99)
Potassium: 3.5 mmol/L (ref 3.5–5.1)
Sodium: 139 mmol/L (ref 135–145)

## 2014-03-26 MED ORDER — SENNOSIDES-DOCUSATE SODIUM 8.6-50 MG PO TABS
2.0000 | ORAL_TABLET | Freq: Every day | ORAL | Status: DC
  Administered 2014-03-26: 2 via ORAL
  Filled 2014-03-26 (×2): qty 2

## 2014-03-26 MED ORDER — BISACODYL 10 MG RE SUPP
10.0000 mg | Freq: Once | RECTAL | Status: AC
  Administered 2014-03-26: 10 mg via RECTAL
  Filled 2014-03-26: qty 1

## 2014-03-26 NOTE — Progress Notes (Signed)
Patient ID: [name], female   DOB: 31-Dec-1960, 54 y.o.   MRN: [id number]     [facility]      [address] [place], [place] [address]    Phone: [phone] FAX: [phone]     Subjective: Passing flatus. No BM.  Tolerated clears.  Objective:  Vital signs:  Filed Vitals:   03/25/14 1541 03/25/14 2001 03/25/14 2130 03/26/14 0638  BP: 123/60 130/72  108/62  Pulse: 75 76  67  Temp: 97.9 F (36.6 C) 97.7 F (36.5 C)  98 F (36.7 C)  TempSrc: Oral Oral  Oral  Resp: $Remo'18 18  17  'mNYcj$ Height:      Weight:    201 lb 15.1 oz (91.6 kg)  SpO2: 100% 100% 99% 100%    Last BM Date: 03/23/14  Intake/Output   Yesterday:  02/01 0701 - 02/02 0700 In: 3600 [P.O.:1200; I.V.:2100; IV Piggyback:300] Out: 2400 [Urine:2000; Emesis/NG output:400] This shift:    I/O last 3 completed shifts: In: 3900 [P.O.:1200; I.V.:2400; IV Piggyback:300] Out: 2400 [Urine:2000; Emesis/NG output:400]      Physical Exam: General: Pt awake/alert/oriented x4 in no acute distress  Abdomen: Soft. Nondistended. Non tender. No evidence of peritonitis. No incarcerated hernias.    Problem List:   Principal Problem:   Partial small bowel obstruction Active Problems:   Abdominal pain   COPD (chronic obstructive pulmonary disease)   Hypertension   GERD (gastroesophageal reflux disease)   Hyperlipidemia   Osteoarthritis    Results:   Labs: Results for orders placed or performed during the hospital encounter of 03/21/14 (from the past 48 hour(s))  Basic metabolic panel     Status: Abnormal   Collection Time: 03/25/14  3:08 AM  Result Value Ref Range   Sodium 140 135 - 145 mmol/L   Potassium 3.1 (L) 3.5 - 5.1 mmol/L   Chloride 105 96 - 112 mmol/L   CO2 28 19 - 32 mmol/L   Glucose, Bld 90 70 - 99 mg/dL   BUN <5 (L) 6 - 23 mg/dL    Comment: REPEATED TO VERIFY   Creatinine, Ser 0.72 0.50 - 1.10 mg/dL   Calcium 8.9 8.4 - 10.5 mg/dL   GFR calc  non Af Amer >90 >90 mL/min   GFR calc Af Amer >90 >90 mL/min    Comment: (NOTE) The eGFR has been calculated using the CKD EPI equation. This calculation has not been validated in all clinical situations. eGFR's persistently <90 mL/min signify possible Chronic Kidney Disease.    Anion gap 7 5 - 15    Imaging / Studies: Dg Abd 2 Views  03/25/2014   CLINICAL DATA:  Small bowel obstruction.  EXAM: ABDOMEN - 2 VIEW  COMPARISON:  CT abdomen and pelvis 03/22/2014. Plain films the abdomen 03/22/2014 and 03/24/2014.  FINDINGS: NGT remains in place in good position. Contrast is again seen throughout the colon. Distended loop of small bowel in the mid abdomen measures approximately 7.6 cm compared to 9.3 cm on the most recent exam. No new abnormality is identified.  IMPRESSION: Slight decrease in gaseous distention of a loop of small bowel in the mid abdomen which may reflect an atonic segment or possibly partial small bowel obstruction.   Electronically Signed   By: Inge Rise M.D.   On: 03/25/2014 08:14   Dg Abd 2 Views  03/24/2014   CLINICAL DATA:  Small bowel obstruction.  EXAM: ABDOMEN - 2 VIEW  COMPARISON:  March 23, 2014.  FINDINGS: Residual contrast remains with right and transverse colon. Nasogastric tube tip is seen in proximal stomach. No pneumoperitoneum is noted. Dilated loop of small bowel noted on prior exam remains, but is slightly less prominent.  IMPRESSION: Slightly less prominent dilated small bowel loop is seen in central portion of abdomen, but findings are still concerning for ileus or partial small bowel obstruction.   Electronically Signed   By: Sabino Dick M.D.   On: 03/24/2014 09:03    Medications / Allergies:  Scheduled Meds: . bisacodyl  10 mg Rectal Once  . bisacodyl  10 mg Rectal Once  . heparin subcutaneous  5,000 Units Subcutaneous 3 times per day  . ipratropium  2 spray Nasal QID  . LORazepam  2 mg Intravenous Once  . mometasone-formoterol  2 puff Inhalation  BID  . pantoprazole  40 mg Oral Q0600  . polyethylene glycol  17 g Oral BID  . senna-docusate  1 tablet Oral Daily  . sertraline  100 mg Oral Daily  . sodium chloride  3 mL Intravenous Q12H  . tiotropium  18 mcg Inhalation Daily   Continuous Infusions: . dextrose 5 % and 0.45% NaCl 75 mL/hr at 03/25/14 1900   PRN Meds:.acetaminophen **OR** acetaminophen, albuterol, hydrALAZINE, HYDROmorphone (DILAUDID) injection, LORazepam, oxyCODONE-acetaminophen, promethazine, zolpidem  Antibiotics: Anti-infectives    None        Assessment/Plan SBO/prior hysterectomy/bowel resection/multipe abdominal surgeries Chronic pain with narcotic use at home Constipation with narcotic use - she is on Miralax at home COPD Hypertension S/p Knee replacement   Advance to full liquid diet Dulcolax x1 Mobilize    Erby Pian, [facility] Pager [phone](7A-4:30P) For consults and floor pages call [phone](7A-4:30P)  03/26/2014 7:57 AM

## 2014-03-26 NOTE — Progress Notes (Signed)
Responded to page from Social Work to assist patient with AD.   I went over document with patient. Patient waiting on daughter to arrive to help her .

## 2014-03-26 NOTE — Progress Notes (Signed)
Patient ID: [name]  female  RUE:[id number]    DOB: 10-14-1960    DOA: 03/21/2014  PCP: Diamantina Providence., FNP  Brief history of present illness and  Patient is a 54 year old female with history of recurrent SBO, hysterectomy, COPD, hypertension, hyperlipidemia, GERD presented with nausea, vomiting, abdominal pain for last 2 days prior to admission. Patient had slight diarrhea, liquidy stools. Otherwise denied any fevers, chills or rectal bleeding. Patient was admitted for partial SBO. Surgery has been following the patient closely, NGT off.   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 - CT abd and pelvis showed marked dilatation of small bowel segment along the right side of the abdomen up to 10 cm, new from the prior study. New mesenteric nodes measuring up to 1.2 cm in short axis, an underlying mass cannot be excluded recommended PET CT, cholelithiasis otherwise gallbladder unremarkable, apparent wall thickening along the distal rectum. - NGT discontinued,  patient tolerating clears, diet advanced to full liquid today, mobilize - still no BM yet, on MiraLAX, inc Senokot-S (patient reports that she takes laxatives/stool softeners daily and due to her chronic opioid use for chronic back pain, added Dulcolax suppository   Active Problems:   COPD (chronic obstructive pulmonary disease) - Currently stable, no wheezing, continue albuterol, Spiriva, dulera    Hypertension - BP currently stable continue hydralazine as needed    GERD (gastroesophageal reflux disease) - start oral PPI    Hyperlipidemia - Hold statins    Osteoarthritis - Continue IV pain medications as needed  DVT Prophylaxis: heparin sq  Code Status: FC  Family Communication:  Disposition: hopefully DC home in next 24-48  hours   Consultants:  surgery  Procedures:  CT abd pelvis   multiple abdominal x-rays    Antibiotics:  None    Subjective no abdominal pain, tolerating clears, no nausea, vomiting, abdominal pain, still no BM   Objective: Weight change: 2.6 kg (5 lb 11.7 oz)  Intake/Output Summary (Last 24 hours) at 03/26/14 0835 Last data filed at 03/26/14 0814  Gross per 24 hour  Intake 2881.25 ml  Output   3100 ml  Net -218.75 ml   Blood pressure 121/78, pulse 69, temperature 97.9 F (36.6 C), temperature source Oral, resp. rate 18, height  (1.575 m), weight 91.6 kg (201 lb 15.1 oz), SpO2 99 %.  Physical Exam: General: A x O x3, NAD CVS: S1-S2 clear Chest: CTAB Abdomen: soft, NT, ND, bs+  Extremities: no c/c/e bilaterally   Lab Results: Basic Metabolic Panel:  Recent Labs Lab 03/24/14 0305 03/25/14 0308  NA 141 140  K 3.5 3.1*  CL 110 105  CO2 24 28  GLUCOSE 66* 90  BUN 6 <5*  CREATININE 0.75 0.72  CALCIUM 8.8 8.9   Liver Function Tests:  Recent Labs Lab 03/21/14 1908  AST 28  ALT 16  ALKPHOS 77  BILITOT 0.6  PROT 7.6  ALBUMIN 4.3    Recent Labs Lab 03/21/14 1908  LIPASE 26   No results for input(s): AMMONIA in the last 168 hours. CBC:  Recent Labs Lab 03/21/14 1908 03/23/14 0602  WBC 5.8 4.6  NEUTROABS 2.9  --   HGB 13.6 11.5*  HCT 41.3 35.2*  MCV 86.2 86.9  PLT 242 205   Cardiac Enzymes: No results for input(s): CKTOTAL, CKMB, CKMBINDEX, TROPONINI in  the last 168 hours. BNP: Invalid input(s): POCBNP CBG: No results for input(s): GLUCAP in the last 168 hours.   Micro Results: Recent Results (from the past 240 hour(s))  MRSA PCR Screening     Status: None   Collection Time: 03/22/14  3:24 AM  Result Value Ref Range Status   MRSA by PCR NEGATIVE NEGATIVE Final    Comment:        The GeneXpert MRSA Assay (FDA approved for NASAL specimens only), is one component of a comprehensive MRSA colonization surveillance  program. It is not intended to diagnose MRSA infection nor to guide or monitor treatment for MRSA infections.     Studies/Results: Ct Abdomen Pelvis W Contrast  03/22/2014   CLINICAL DATA:  Acute onset of generalized abdominal pain. Initial encounter.  EXAM: CT ABDOMEN AND PELVIS WITH CONTRAST  TECHNIQUE: Multidetector CT imaging of the abdomen and pelvis was performed using the standard protocol following bolus administration of intravenous contrast.  CONTRAST:  OMNIPAQUE IOHEXOL 300 MG/ML  SOLN  COMPARISON:  CT of the abdomen and pelvis performed 08/13/2007  FINDINGS: Focal bronchiectasis is noted within the right lower lobe.  The liver and spleen are unremarkable in appearance. Stones are seen dependently within the gallbladder; the gallbladder is otherwise unremarkable. The pancreas and adrenal glands are unremarkable.  The kidneys are unremarkable in appearance. There is no evidence of hydronephrosis. No renal or ureteral stones are seen. No perinephric stranding is appreciated.  There is marked dilatation of an unusual small bowel segment along the right side of the abdomen, measuring up to 10.0 cm. This is of uncertain significance, and may reflect prior surgery, with an apparent bowel suture line partially imaged. Contrast does not pass across this segment, possibly reflecting significant associated dysmotility. Just proximal to this segment, there is mild focal wall thickening along the proximal ileum. There is relative decompression of more distal small bowel loops, though a small amount of fluid is still seen within the majority of these loops. An underlying adhesion causing partial small-bowel obstruction cannot be excluded. The distal ileum is decompressed, to the level of the ileocecal junction.  No free fluid is identified. The stomach is within normal limits. No acute vascular abnormalities are seen. Minimal calcification is seen along the common iliac arteries bilaterally. There is  new prominence of mesenteric nodes, measuring up to 1.2 cm in short axis.  Mild scarring is noted along the anterior abdominal wall, reflecting prior surgery.  The patient may be status post appendectomy, as a small focus of increased attenuation is noted at the cecum. There is no evidence of appendicitis. The colon is unremarkable in appearance. There is apparent wall thickening along the distal rectum; this may simply reflect chronic inflammation or intraluminal contents, though depending on the patient's symptoms, sigmoidoscopy could be considered for further evaluation.  The bladder is mildly distended and grossly unremarkable. The patient is status post hysterectomy. No suspicious adnexal masses are seen. No inguinal lymphadenopathy is seen.  No acute osseous abnormalities are identified.  IMPRESSION: 1. Marked dilatation of an unusual small bowel segment along the right side of the abdomen, measuring up to 10.0 cm. Dilatation is new from the prior study. Some of this may reflect prior surgery, with an apparent associated bowel suture line. Contrast does not pass across this segment, possibly reflecting significant associated dysmotility. There is mild focal wall thickening along the proximal ileum just proximal to this segment, and relative decompression of more distal small bowel loops, though  a small amount of fluid is still seen within the majority of the distal loops. Cannot exclude underlying adhesion, causing partial small bowel obstruction. 2. New prominence of mesenteric nodes, measuring up to 1.2 cm in short axis. An underlying mass cannot be excluded, but is not well assessed on this study. If the patient's symptoms persist, PET/CT could be considered for further evaluation. 3. Cholelithiasis; gallbladder otherwise unremarkable. 4. Apparent wall thickening along the distal rectum. This may simply reflect chronic inflammation or intraluminal contents, though depending on the patient's symptoms,  sigmoidoscopy could be considered for further evaluation. 5. Focal bronchiectasis at the right lower lung lobe.   Electronically Signed   By: Roanna Raider M.D.   On: 03/22/2014 00:50   Dg Abd 2 Views  03/25/2014   CLINICAL DATA:  Small bowel obstruction.  EXAM: ABDOMEN - 2 VIEW  COMPARISON:  CT abdomen and pelvis 03/22/2014. Plain films the abdomen 03/22/2014 and 03/24/2014.  FINDINGS: NGT remains in place in good position. Contrast is again seen throughout the colon. Distended loop of small bowel in the mid abdomen measures approximately 7.6 cm compared to 9.3 cm on the most recent exam. No new abnormality is identified.  IMPRESSION: Slight decrease in gaseous distention of a loop of small bowel in the mid abdomen which may reflect an atonic segment or possibly partial small bowel obstruction.   Electronically Signed   By: Drusilla Kanner M.D.   On: 03/25/2014 08:14   Dg Abd 2 Views  03/24/2014   CLINICAL DATA:  Small bowel obstruction.  EXAM: ABDOMEN - 2 VIEW  COMPARISON:  March 23, 2014.  FINDINGS: Residual contrast remains with right and transverse colon. Nasogastric tube tip is seen in proximal stomach. No pneumoperitoneum is noted. Dilated loop of small bowel noted on prior exam remains, but is slightly less prominent.  IMPRESSION: Slightly less prominent dilated small bowel loop is seen in central portion of abdomen, but findings are still concerning for ileus or partial small bowel obstruction.   Electronically Signed   By: Roque Lias M.D.   On: 03/24/2014 09:03   Dg Abd 2 Views  03/23/2014   CLINICAL DATA:  Follow-up small bowel obstruction. Diarrhea and vomiting for 2 days. Subsequent encounter.  EXAM: ABDOMEN - 2 VIEW  COMPARISON:  Abdominal radiograph from 03/22/2014  FINDINGS: There is persistent marked dilatation of a small bowel loop at the right side of the abdomen, measuring up to 10.3 cm in diameter. The dilated segment appears slightly shorter than on the prior study, though it  remains equally dilated. As contrast has made it into the colon, this is less likely to reflect bowel obstruction. It may reflect an adynamic segment of small bowel, as a result of prior surgery. The colon is unremarkable in appearance.  No free intra-abdominal air is identified. An enteric tube is seen ending overlying the left upper quadrant. The visualized lung bases are grossly clear. No acute osseous abnormalities are seen.  IMPRESSION: Stable persistent marked dilatation of a small bowel loop at the right side of the abdomen. The dilated segment appears to be shorter than on the prior study. As contrast is seen filling the colon, this is unlikely to reflect bowel obstruction. It may reflect an adynamic segment of small bowel, as a result of prior surgery.   Electronically Signed   By: Roanna Raider M.D.   On: 03/23/2014 08:46   Dg Abd Portable 1v  03/22/2014   CLINICAL DATA:  Nasogastric tube placement  EXAM: PORTABLE ABDOMEN - 1 VIEW  COMPARISON:  Portable exam 1349 hr compared to CT abdomen and pelvis of 03/22/2014  FINDINGS: Nasogastric tube projects over proximal stomach.  Retained contrast in colon indicating a lack of complete bowel obstruction since administration of GI contrast earlier same date.  Persistent significant gaseous distention of a small bowel loop in the upper mid abdomen, uncertain etiology.  This appearance is atypical for a standard small bowel obstruction due to adhesions in that several significantly dilated loops are present in the mid small bowel with normal caliber loops more proximally.  This could represent an atypical small bowel obstruction or an adynamic / atonic segment of small bowel as result of prior surgery.  No definite evidence of internal hernia or small bowel volvulus by prior CT.  Prior CT demonstrated focal wall thickening of a small bowel loop at the point of obstruction in the central pelvis, cannot exclude edema or mass.  No definite bowel wall thickening or  gross free intraperitoneal air by supine radiograph.  Bones unremarkable.  IMPRESSION: Nasogastric tube projects over proximal stomach.  Persistent gaseous distension of a small bowel loop in the upper mid abdomen, up to 9.3 cm diameter, question atypical partial small bowel obstruction or potentially an adynamic/atonic segment of small bowel as result of prior surgery.  No definite evidence of volvulus or internal hernia by CT.  Recommend continued followup.   Electronically Signed   By: Ulyses SouthwardMark  Boles M.D.   On: 03/22/2014 15:05    Medications: Scheduled Meds: . bisacodyl  10 mg Rectal Once  . bisacodyl  10 mg Rectal Once  . heparin subcutaneous  5,000 Units Subcutaneous 3 times per day  . ipratropium  2 spray Nasal QID  . LORazepam  2 mg Intravenous Once  . mometasone-formoterol  2 puff Inhalation BID  . pantoprazole  40 mg Oral Q0600  . polyethylene glycol  17 g Oral BID  . senna-docusate  2 tablet Oral Daily  . sertraline  100 mg Oral Daily  . sodium chloride  3 mL Intravenous Q12H  . tiotropium  18 mcg Inhalation Daily   Time spent 25 minutes   LOS: 5 days   Nakota Elsen M.D. [facility] 03/26/2014, 8:35 AM Pager: [phone]  If 7PM-7AM, please contact night-coverage [email or web address] Password [email or web address]

## 2014-03-26 NOTE — Psychosocial Assessment (Cosign Needed)
Clinical Social Work Department BRIEF PSYCHOSOCIAL ASSESSMENT 03/26/2014  Patient:  [name],[name]     Account Number:  [id number]     Admit date:  03/21/2014  Clinical Social Worker:  Demico Ploch, CLINICAL SOCIAL WORKER  Date/Time:  03/26/2014 02:22 PM  Referred by:  Physician  Date Referred:  03/26/2014 Referred for  Advanced Directives   Other Referral:   Psychosocial assessment   Interview type:  Patient Other interview type:    PSYCHOSOCIAL DATA Living Status:  ALONE Admitted from facility:   Level of care:   Primary support name:   Primary support relationship to patient:   Degree of support available:    CURRENT CONCERNS  Other Concerns:    SOCIAL WORK ASSESSMENT / PLAN BSW Intern talked with pt regarding wanting an ProofreaderAdvance Directive. SW explained the proces and gave her a copy to review and start filling out. SW Supervisor contacted [place] to come talk with pt. Pt stated that she lives alone and is independent at home. 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 Her brother and sister also help out with caring for her mother and mother live actually lives with her sister.  Patient denied any current needs for herself.   Assessment/plan status:  Psychosocial Support/Ongoing Assessment of Needs Other assessment/ plan:   Information/referral to community resources:   Advanced Directive form provided to patient.  Chaplain referral completed. SW services to assist with completion of document/notary if indicated.    PATIENT'S/FAMILY'S RESPONSE TO PLAN OF CARE: Pt was alert and walking around in the room. She was very plesent and nice to talk to.Patient states that she is feeling better and is looking forward to going home. No family present; patient is fully independent, alert, and oriented.  She was appreciative of SW Intern's visit.  No further needs identified. SW will sign off.

## 2014-03-27 DIAGNOSIS — J438 Other emphysema: Secondary | ICD-10-CM

## 2014-03-27 NOTE — Progress Notes (Signed)
  Subjective: PT doing well with FLD.  Asking for reg food. +BM  Objective: Vital signs in last 24 hours: Temp:  [97.9 F (36.6 C)-98.6 F (37 C)] 98.6 F (37 C) (02/03 0500) Pulse Rate:  [68-78] 68 (02/03 0500) Resp:  [18] 18 (02/03 0500) BP: (109-135)/(62-78) 109/62 mmHg (02/03 0500) SpO2:  [94 %-100 %] 96 % (02/03 0500) Weight:  [200 lb 5.7 oz (90.882 kg)] 200 lb 5.7 oz (90.882 kg) (02/03 0500) Last BM Date: 03/26/14  Intake/Output from previous day: 02/02 0701 - 02/03 0700 In: 2880 [P.O.:1080; I.V.:1800] Out: 3250 [Urine:3250] Intake/Output this shift:    General appearance: alert and cooperative GI: soft, non-tender; bowel sounds normal; no masses,  no organomegaly  Lab Results:  No results for input(s): WBC, HGB, HCT, PLT in the last 72 hours. BMET  Recent Labs  03/25/14 0308 03/26/14 0822  NA 140 139  K 3.1* 3.5  CL 105 105  CO2 28 28  GLUCOSE 90 92  BUN <5* <5*  CREATININE 0.72 0.65  CALCIUM 8.9 9.1   Assessment/Plan: SBO resolved -adv diet to Reg -Ok for DC from our standpoint.  No f/u needed -call with questions   LOS: 6 days    Marigene Ehlersamirez Jr., [facility] 03/27/2014

## 2014-03-27 NOTE — Discharge Summary (Signed)
Physician Discharge Summary  Jalia Zuniga Hoffman OZH:[id number] DOB: 1961/02/03 DOA: 03/21/2014  PCP: Diamantina Providence., FNP  Admit date: 03/21/2014 Discharge date: 03/27/2014  Time spent: 20 minutes  Recommendations for Outpatient Follow-up:  1. Follow up with PCP in 1-2 weeks 2. Please follow up on 1.2 cm mesenteric nodes seen on CT. Consider repeat CT abd in 1-3 months  Discharge Diagnoses:  Principal Problem:   Partial small bowel obstruction Active Problems:   Abdominal pain   COPD (chronic obstructive pulmonary disease)   Hypertension   GERD (gastroesophageal reflux disease)   Hyperlipidemia   Osteoarthritis   Discharge Condition: Improved  Diet recommendation: Regular  Filed Weights   03/25/14 0544 03/26/14 0638 03/27/14 0500  Weight: 89 kg (196 lb 3.4 oz) 91.6 kg (201 lb 15.1 oz) 90.882 kg (200 lb 5.7 oz)    History of present illness:  Please see admit h and p from 1/29 for details. Briefly, the patient presented with complaints of abd pains. In the ED, the patient was noted to have evidence of partial SBO. She was admitted for further work up.  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 - CT abd and pelvis showed marked dilatation of small bowel segment along the right side of the abdomen up to 10 cm, new from the prior study.  - Surgery was consulted. - Initial consideration for surgical management, however the patient gradually improved with conservative measures - NGT was ultimately discontinued and the patient tolerated an advanced diet with positive bowel movements noted   COPD (chronic obstructive pulmonary disease) - Remained stable, no wheezing,  - continued albuterol, Spiriva, dulera   Hypertension - BP remained stable   GERD (gastroesophageal reflux disease) - Pt was  started on oral PPI   Hyperlipidemia - Held statins while inpatient, to be resumed on discharge   Osteoarthritis - Patient to continue analgesics as tolerated. Pt understands to take a stool regimen while on narcotics  Consultations:  General Surgery  Discharge Exam: Filed Vitals:   03/26/14 2047 03/26/14 2059 03/27/14 0500 03/27/14 1021  BP: 135/67  109/62 119/67  Pulse: 78  68   Temp: 98.3 F (36.8 C)  98.6 F (37 C) 97.9 F (36.6 C)  TempSrc: Oral  Oral Oral  Resp: Height:      Weight:   90.882 kg (200 lb 5.7 oz)   SpO2: 94% 98% 96% 100%    General: Awake, in nad Cardiovascular: regular, s1, s2 Respiratory: normal resp effort, no wheezing  Discharge Instructions     Medication List    STOP taking these medications        minocycline 100 MG capsule  Commonly known as:  MINOCIN,DYNACIN      TAKE these medications        albuterol 108 (90 BASE) MCG/ACT inhaler  Commonly known as:  PROVENTIL HFA;VENTOLIN HFA  Inhale 1 puff into the lungs every 6 (six) hours as needed for wheezing or shortness of breath.     amLODipine 10 MG tablet  Commonly known as:  NORVASC  Take 10 mg by mouth daily.     rosuvastatin 10 MG tablet  Commonly known as:  CRESTOR  Take 10 mg by mouth daily.     CRESTOR PO  Take by mouth.     Fluticasone-Salmeterol 500-50 MCG/DOSE  Aepb  Commonly known as:  ADVAIR  Inhale 1 puff into the lungs 2 (two) times daily.     ipratropium 0.06 % nasal spray  Commonly known as:  ATROVENT  Place 2 sprays into the nose 4 (four) times daily.     lisinopril 20 MG tablet  Commonly known as:  PRINIVIL,ZESTRIL  Take 20 mg by mouth daily.     LISINOPRIL PO  Take by mouth.     LORazepam 0.5 MG tablet  Commonly known as:  ATIVAN  Take 0.5 mg by mouth every 6 (six) hours as needed for anxiety.     esomeprazole 40 MG capsule  Commonly known as:  NEXIUM  Take 40 mg by mouth daily at 12 noon.     NEXIUM PO  Take by mouth.      oxyCODONE-acetaminophen 5-325 MG per tablet  Commonly known as:  PERCOCET/ROXICET  Take 1-2 tablets by mouth every 6 (six) hours as needed for severe pain.     polyethylene glycol packet  Commonly known as:  MIRALAX / GLYCOLAX  Take 17 g by mouth daily.     sertraline 100 MG tablet  Commonly known as:  ZOLOFT  Take 100 mg by mouth daily.     tiotropium 18 MCG inhalation capsule  Commonly known as:  SPIRIVA  Place 18 mcg into inhaler and inhale daily.     traMADol 50 MG tablet  Commonly known as:  ULTRAM  Take 1 tablet (50 mg total) by mouth every 6 (six) hours as needed for moderate pain.     zolpidem 10 MG tablet  Commonly known as:  AMBIEN  Take 10 mg by mouth at bedtime as needed for sleep.       Allergies  Allergen Reactions  . Aspirin   . Corn-Containing Products   . Ibuprofen   . Latex   . Morphine And Related   . Peanuts [Peanut Oil]   . Shellfish Allergy   . Zofran [Ondansetron Hcl] Itching   Follow-up Information    Follow up with Diamantina Providence., FNP.   Specialty:  Nurse Practitioner   Why:  in 1-2 weeks - keep appointment   Contact information:   [address] [place] [address] [phone]        The results of significant diagnostics from this hospitalization (including imaging, microbiology, ancillary and laboratory) are listed below for reference.    Significant Diagnostic Studies: Ct Abdomen Pelvis W Contrast  03/22/2014   CLINICAL DATA:  Acute onset of generalized abdominal pain. Initial encounter.  EXAM: CT ABDOMEN AND PELVIS WITH CONTRAST  TECHNIQUE: Multidetector CT imaging of the abdomen and pelvis was performed using the standard protocol following bolus administration of intravenous contrast.  CONTRAST:  OMNIPAQUE IOHEXOL 300 MG/ML  SOLN  COMPARISON:  CT of the abdomen and pelvis performed 08/13/2007  FINDINGS: Focal bronchiectasis is noted within the right lower lobe.  The liver and spleen are unremarkable in  appearance. Stones are seen dependently within the gallbladder; the gallbladder is otherwise unremarkable. The pancreas and adrenal glands are unremarkable.  The kidneys are unremarkable in appearance. There is no evidence of hydronephrosis. No renal or ureteral stones are seen. No perinephric stranding is appreciated.  There is marked dilatation of an unusual small bowel segment along the right side of the abdomen, measuring up to 10.0 cm. This is of uncertain significance, and may reflect prior surgery, with an apparent bowel suture line partially imaged. Contrast does not  pass across this segment, possibly reflecting significant associated dysmotility. Just proximal to this segment, there is mild focal wall thickening along the proximal ileum. There is relative decompression of more distal small bowel loops, though a small amount of fluid is still seen within the majority of these loops. An underlying adhesion causing partial small-bowel obstruction cannot be excluded. The distal ileum is decompressed, to the level of the ileocecal junction.  No free fluid is identified. The stomach is within normal limits. No acute vascular abnormalities are seen. Minimal calcification is seen along the common iliac arteries bilaterally. There is new prominence of mesenteric nodes, measuring up to 1.2 cm in short axis.  Mild scarring is noted along the anterior abdominal wall, reflecting prior surgery.  The patient may be status post appendectomy, as a small focus of increased attenuation is noted at the cecum. There is no evidence of appendicitis. The colon is unremarkable in appearance. There is apparent wall thickening along the distal rectum; this may simply reflect chronic inflammation or intraluminal contents, though depending on the patient's symptoms, sigmoidoscopy could be considered for further evaluation.  The bladder is mildly distended and grossly unremarkable. The patient is status post hysterectomy. No suspicious  adnexal masses are seen. No inguinal lymphadenopathy is seen.  No acute osseous abnormalities are identified.  IMPRESSION: 1. Marked dilatation of an unusual small bowel segment along the right side of the abdomen, measuring up to 10.0 cm. Dilatation is new from the prior study. Some of this may reflect prior surgery, with an apparent associated bowel suture line. Contrast does not pass across this segment, possibly reflecting significant associated dysmotility. There is mild focal wall thickening along the proximal ileum just proximal to this segment, and relative decompression of more distal small bowel loops, though a small amount of fluid is still seen within the majority of the distal loops. Cannot exclude underlying adhesion, causing partial small bowel obstruction. 2. New prominence of mesenteric nodes, measuring up to 1.2 cm in short axis. An underlying mass cannot be excluded, but is not well assessed on this study. If the patient's symptoms persist, PET/CT could be considered for further evaluation. 3. Cholelithiasis; gallbladder otherwise unremarkable. 4. Apparent wall thickening along the distal rectum. This may simply reflect chronic inflammation or intraluminal contents, though depending on the patient's symptoms, sigmoidoscopy could be considered for further evaluation. 5. Focal bronchiectasis at the right lower lung lobe.   Electronically Signed   By: Roanna Raider M.D.   On: 03/22/2014 00:50   Dg Abd 2 Views  03/25/2014   CLINICAL DATA:  Small bowel obstruction.  EXAM: ABDOMEN - 2 VIEW  COMPARISON:  CT abdomen and pelvis 03/22/2014. Plain films the abdomen 03/22/2014 and 03/24/2014.  FINDINGS: NGT remains in place in good position. Contrast is again seen throughout the colon. Distended loop of small bowel in the mid abdomen measures approximately 7.6 cm compared to 9.3 cm on the most recent exam. No new abnormality is identified.  IMPRESSION: Slight decrease in gaseous distention of a loop of  small bowel in the mid abdomen which may reflect an atonic segment or possibly partial small bowel obstruction.   Electronically Signed   By: Drusilla Kanner M.D.   On: 03/25/2014 08:14   Dg Abd 2 Views  03/24/2014   CLINICAL DATA:  Small bowel obstruction.  EXAM: ABDOMEN - 2 VIEW  COMPARISON:  March 23, 2014.  FINDINGS: Residual contrast remains with right and transverse colon. Nasogastric tube tip is seen in proximal  stomach. No pneumoperitoneum is noted. Dilated loop of small bowel noted on prior exam remains, but is slightly less prominent.  IMPRESSION: Slightly less prominent dilated small bowel loop is seen in central portion of abdomen, but findings are still concerning for ileus or partial small bowel obstruction.   Electronically Signed   By: Roque LiasJames  Green M.D.   On: 03/24/2014 09:03   Dg Abd 2 Views  03/23/2014   CLINICAL DATA:  Follow-up small bowel obstruction. Diarrhea and vomiting for 2 days. Subsequent encounter.  EXAM: ABDOMEN - 2 VIEW  COMPARISON:  Abdominal radiograph from 03/22/2014  FINDINGS: There is persistent marked dilatation of a small bowel loop at the right side of the abdomen, measuring up to 10.3 cm in diameter. The dilated segment appears slightly shorter than on the prior study, though it remains equally dilated. As contrast has made it into the colon, this is less likely to reflect bowel obstruction. It may reflect an adynamic segment of small bowel, as a result of prior surgery. The colon is unremarkable in appearance.  No free intra-abdominal air is identified. An enteric tube is seen ending overlying the left upper quadrant. The visualized lung bases are grossly clear. No acute osseous abnormalities are seen.  IMPRESSION: Stable persistent marked dilatation of a small bowel loop at the right side of the abdomen. The dilated segment appears to be shorter than on the prior study. As contrast is seen filling the colon, this is unlikely to reflect bowel obstruction. It may  reflect an adynamic segment of small bowel, as a result of prior surgery.   Electronically Signed   By: Roanna RaiderJeffery  Chang M.D.   On: 03/23/2014 08:46   Dg Abd Portable 1v  03/22/2014   CLINICAL DATA:  Nasogastric tube placement  EXAM: PORTABLE ABDOMEN - 1 VIEW  COMPARISON:  Portable exam 1349 hr compared to CT abdomen and pelvis of 03/22/2014  FINDINGS: Nasogastric tube projects over proximal stomach.  Retained contrast in colon indicating a lack of complete bowel obstruction since administration of GI contrast earlier same date.  Persistent significant gaseous distention of a small bowel loop in the upper mid abdomen, uncertain etiology.  This appearance is atypical for a standard small bowel obstruction due to adhesions in that several significantly dilated loops are present in the mid small bowel with normal caliber loops more proximally.  This could represent an atypical small bowel obstruction or an adynamic / atonic segment of small bowel as result of prior surgery.  No definite evidence of internal hernia or small bowel volvulus by prior CT.  Prior CT demonstrated focal wall thickening of a small bowel loop at the point of obstruction in the central pelvis, cannot exclude edema or mass.  No definite bowel wall thickening or gross free intraperitoneal air by supine radiograph.  Bones unremarkable.  IMPRESSION: Nasogastric tube projects over proximal stomach.  Persistent gaseous distension of a small bowel loop in the upper mid abdomen, up to 9.3 cm diameter, question atypical partial small bowel obstruction or potentially an adynamic/atonic segment of small bowel as result of prior surgery.  No definite evidence of volvulus or internal hernia by CT.  Recommend continued followup.   Electronically Signed   By: Ulyses SouthwardMark  Boles M.D.   On: 03/22/2014 15:05    Microbiology: Recent Results (from the past 240 hour(s))  MRSA PCR Screening     Status: None   Collection Time: 03/22/14  3:24 AM  Result Value Ref Range  Status   MRSA  by PCR NEGATIVE NEGATIVE Final    Comment:        The GeneXpert MRSA Assay (FDA approved for NASAL specimens only), is one component of a comprehensive MRSA colonization surveillance program. It is not intended to diagnose MRSA infection nor to guide or monitor treatment for MRSA infections.      Labs: Basic Metabolic Panel:  Recent Labs Lab 03/21/14 1908 03/23/14 0602 03/24/14 0305 03/25/14 0308 03/26/14 0822  NA 138 135 141 140 139  K 4.4 3.9 3.5 3.1* 3.5  CL 101 104 110 105 105  CO2 GLUCOSE 94 62* 66* 90 92  BUN 6 <5* 6 <5* <5*  CREATININE 0.70 0.63 0.75 0.72 0.65  CALCIUM 10.0 8.8 8.8 8.9 9.1  MG  --   --   --   --  1.7   Liver Function Tests:  Recent Labs Lab 03/21/14 1908  AST 28  ALT 16  ALKPHOS 77  BILITOT 0.6  PROT 7.6  ALBUMIN 4.3    Recent Labs Lab 03/21/14 1908  LIPASE 26   No results for input(s): AMMONIA in the last 168 hours. CBC:  Recent Labs Lab 03/21/14 1908 03/23/14 0602  WBC 5.8 4.6  NEUTROABS 2.9  --   HGB 13.6 11.5*  HCT 41.3 35.2*  MCV 86.2 86.9  PLT 242 205   Cardiac Enzymes: No results for input(s): CKTOTAL, CKMB, CKMBINDEX, TROPONINI in the last 168 hours. BNP: BNP (last 3 results) No results for input(s): BNP in the last 8760 hours.  ProBNP (last 3 results) No results for input(s): PROBNP in the last 8760 hours.  CBG: No results for input(s): GLUCAP in the last 168 hours.   Signed:  Edgar Reisz, Scheryl Marten  [facility] 03/27/2014, 5:37 PM

## 2014-03-27 NOTE — Progress Notes (Signed)
Pt discharged home. Discharge instructions completed. Medications reviewed with patient. Verbalizes understanding.

## 2014-04-24 ENCOUNTER — Ambulatory Visit (AMBULATORY_SURGERY_CENTER): Payer: Self-pay

## 2014-04-24 VITALS — Ht 62.0 in | Wt 199.0 lb

## 2014-04-24 DIAGNOSIS — Z800 Family history of malignant neoplasm of digestive organs: Secondary | ICD-10-CM

## 2014-04-24 MED ORDER — SUPREP BOWEL PREP KIT 17.5-3.13-1.6 GM/177ML PO SOLN: 1.0000 | Freq: Once | ORAL | Status: DC

## 2014-04-24 NOTE — Progress Notes (Signed)
No allergies to eggs or soy No home oxygen No past problems with anesthesia No diet/weight loss meds  No email 

## 2014-05-08 ENCOUNTER — Encounter: Payer: Self-pay | Admitting: Gastroenterology

## 2014-05-08 ENCOUNTER — Ambulatory Visit (AMBULATORY_SURGERY_CENTER): Payer: Medicare HMO | Admitting: Gastroenterology

## 2014-05-08 VITALS — BP 144/73 | HR 83 | Temp 97.3°F | Resp 16 | Ht 62.0 in | Wt 199.0 lb

## 2014-05-08 DIAGNOSIS — Z1211 Encounter for screening for malignant neoplasm of colon: Secondary | ICD-10-CM | POA: Diagnosis not present

## 2014-05-08 DIAGNOSIS — Z800 Family history of malignant neoplasm of digestive organs: Secondary | ICD-10-CM

## 2014-05-08 MED ORDER — SODIUM CHLORIDE 0.9 % IV SOLN: 500.0000 mL | INTRAVENOUS | Status: DC

## 2014-05-08 NOTE — Progress Notes (Signed)
Pt awake and alert, pleaed with MAC, report to RN

## 2014-05-08 NOTE — Op Note (Addendum)
[facility] [address] [place], [id number]   COLONOSCOPY PROCEDURE REPORT  PATIENT: [name], Zoe G  MR#: [id number] BIRTHDATE: 1960/12/29 , 53  yrs. old GENDER: female ENDOSCOPIST: Louis Meckelobert D Kaplan, MD REFERRED BY: PROCEDURE DATE:  05/08/2014 PROCEDURE:   Colonoscopy, screening First Screening Colonoscopy - Avg.  risk and is 50 yrs.  old or older Yes.  Prior Negative Screening - Now for repeat screening. N/A  History of Adenoma - Now for follow-up colonoscopy & has been > or = to 3 yrs.  N/A ASA CLASS:   Class II INDICATIONS:FH Colon or Rectal Adenocarcinoma. MEDICATIONS: Monitored anesthesia care and Propofol 350 mg IV  DESCRIPTION OF PROCEDURE:   After the risks benefits and alternatives of the procedure were thoroughly explained, informed consent was obtained.  The digital rectal exam revealed no abnormalities of the rectum.   The LB [id number]  endoscope was introduced through the anus and advanced to the cecum, which was identified by both the appendix and ileocecal valve. No adverse events experienced.   Limited by poor preparation.   The quality of the prep was (Suprep was used) fair.  The instrument was then slowly withdrawn as the colon was fully examined.      COLON FINDINGS: A normal appearing cecum, ileocecal valve, and appendiceal orifice were identified.  The ascending, transverse, descending, sigmoid colon, and rectum appeared unremarkable. Retroflexed views revealed no abnormalities. The time to cecum = 6.1 Withdrawal time = 6.2   The scope was withdrawn and the procedure completed. COMPLICATIONS: There were no immediate complications.  ENDOSCOPIC IMPRESSION: Normal colonoscopy  RECOMMENDATIONS: Given your significant family history of colon cancer, you should have a repeat colonoscopy in 5 years  eSigned:  Louis Meckelobert D Kaplan, MD 05/08/2014 11:01 AM Revised: 05/08/2014 11:01 AM  cc: Dayton Scrapeakela Anderson, MD

## 2014-05-08 NOTE — Patient Instructions (Signed)
YOU HAD AN ENDOSCOPIC PROCEDURE TODAY AT THE [facility]:   Refer to the procedure report that was given to you for any specific questions about what was found during the examination.  If the procedure report does not answer your questions, please call your gastroenterologist to clarify.  If you requested that your care partner not be given the details of your procedure findings, then the procedure report has been included in a sealed envelope for you to review at your convenience later.  YOU SHOULD EXPECT: Some feelings of bloating in the abdomen. Passage of more gas than usual.  Walking can help get rid of the air that was put into your GI tract during the procedure and reduce the bloating. If you had a lower endoscopy (such as a colonoscopy or flexible sigmoidoscopy) you may notice spotting of blood in your stool or on the toilet paper. If you underwent a bowel prep for your procedure, you may not have a normal bowel movement for a few days.  Please Note:  You might notice some irritation and congestion in your nose or some drainage.  This is from the oxygen used during your procedure.  There is no need for concern and it should clear up in a day or so.  SYMPTOMS TO REPORT IMMEDIATELY:   Following lower endoscopy (colonoscopy or flexible sigmoidoscopy):  Excessive amounts of blood in the stool  Significant tenderness or worsening of abdominal pains  Swelling of the abdomen that is new, acute  Fever of 100F or higher   For urgent or emergent issues, a gastroenterologist can be reached at any hour by calling [phone].   DIET: Your first meal following the procedure should be a small meal and then it is ok to progress to your normal diet. Heavy or fried foods are harder to digest and may make you feel nauseous or bloated.  Likewise, meals heavy in dairy and vegetables can increase bloating.  Drink plenty of fluids but you should avoid alcoholic beverages for 24  hours.  ACTIVITY:  You should plan to take it easy for the rest of today and you should NOT DRIVE or use heavy machinery until tomorrow (because of the sedation medicines used during the test).    FOLLOW UP: Our staff will call the number listed on your records the next business day following your procedure to check on you and address any questions or concerns that you may have regarding the information given to you following your procedure. If we do not reach you, we will leave a message.  However, if you are feeling well and you are not experiencing any problems, there is no need to return our call.  We will assume that you have returned to your regular daily activities without incident.  If any biopsies were taken you will be contacted by phone or by letter within the next 1-3 weeks.  Please call us at [phone] if you have not heard about the biopsies in 3 weeks.    SIGNATURES/CONFIDENTIALITY: You and/or your care partner have signed paperwork which will be entered into your electronic medical record.  These signatures attest to the fact that that the information above on your After Visit Summary has been reviewed and is understood.  Full responsibility of the confidentiality of this discharge information lies with you and/or your care-partner.  Recommendations Discharge instructions given to patient and/or care partner. Next colonoscopy in 5 years. 

## 2014-05-09 ENCOUNTER — Telehealth: Payer: Self-pay

## 2014-05-09 NOTE — Telephone Encounter (Signed)
No answer, left voicemail

## 2014-06-04 ENCOUNTER — Ambulatory Visit
Admission: RE | Admit: 2014-06-04 | Discharge: 2014-06-04 | Disposition: A | Discharge status: Home / Self Care | Payer: Medicare HMO | Source: Ambulatory Visit | Attending: Nurse Practitioner | Admitting: Nurse Practitioner

## 2014-06-04 ENCOUNTER — Other Ambulatory Visit: Payer: Self-pay | Admitting: Nurse Practitioner

## 2014-06-04 DIAGNOSIS — R921 Mammographic calcification found on diagnostic imaging of breast: Secondary | ICD-10-CM

## 2014-07-30 ENCOUNTER — Encounter (HOSPITAL_COMMUNITY): Payer: Self-pay | Admitting: Physical Medicine and Rehabilitation

## 2014-07-30 ENCOUNTER — Inpatient Hospital Stay (HOSPITAL_COMMUNITY)
Admission: EM | Admit: 2014-07-30 | Discharge: 2014-08-03 | DRG: 390 | Disposition: A | Discharge status: Home / Self Care | Payer: Medicare HMO | Attending: Internal Medicine | Admitting: Internal Medicine

## 2014-07-30 DIAGNOSIS — R1013 Epigastric pain: Secondary | ICD-10-CM

## 2014-07-30 DIAGNOSIS — Z6835 Body mass index (BMI) 35.0-35.9, adult: Secondary | ICD-10-CM

## 2014-07-30 DIAGNOSIS — R1032 Left lower quadrant pain: Secondary | ICD-10-CM | POA: Insufficient documentation

## 2014-07-30 DIAGNOSIS — K219 Gastro-esophageal reflux disease without esophagitis: Secondary | ICD-10-CM | POA: Diagnosis present

## 2014-07-30 DIAGNOSIS — Z91018 Allergy to other foods: Secondary | ICD-10-CM

## 2014-07-30 DIAGNOSIS — Z96651 Presence of right artificial knee joint: Secondary | ICD-10-CM | POA: Diagnosis present

## 2014-07-30 DIAGNOSIS — K5660 Unspecified intestinal obstruction: Secondary | ICD-10-CM | POA: Diagnosis not present

## 2014-07-30 DIAGNOSIS — J449 Chronic obstructive pulmonary disease, unspecified: Secondary | ICD-10-CM | POA: Diagnosis present

## 2014-07-30 DIAGNOSIS — I10 Essential (primary) hypertension: Secondary | ICD-10-CM | POA: Diagnosis present

## 2014-07-30 DIAGNOSIS — R109 Unspecified abdominal pain: Secondary | ICD-10-CM | POA: Diagnosis present

## 2014-07-30 DIAGNOSIS — E785 Hyperlipidemia, unspecified: Secondary | ICD-10-CM | POA: Diagnosis present

## 2014-07-30 DIAGNOSIS — Z91030 Bee allergy status: Secondary | ICD-10-CM

## 2014-07-30 DIAGNOSIS — Z90710 Acquired absence of both cervix and uterus: Secondary | ICD-10-CM

## 2014-07-30 DIAGNOSIS — Z91013 Allergy to seafood: Secondary | ICD-10-CM

## 2014-07-30 DIAGNOSIS — Z885 Allergy status to narcotic agent status: Secondary | ICD-10-CM

## 2014-07-30 DIAGNOSIS — H269 Unspecified cataract: Secondary | ICD-10-CM | POA: Diagnosis present

## 2014-07-30 DIAGNOSIS — Z91010 Allergy to peanuts: Secondary | ICD-10-CM

## 2014-07-30 DIAGNOSIS — Z888 Allergy status to other drugs, medicaments and biological substances status: Secondary | ICD-10-CM

## 2014-07-30 DIAGNOSIS — E669 Obesity, unspecified: Secondary | ICD-10-CM | POA: Diagnosis present

## 2014-07-30 DIAGNOSIS — K56609 Unspecified intestinal obstruction, unspecified as to partial versus complete obstruction: Secondary | ICD-10-CM

## 2014-07-30 DIAGNOSIS — R1030 Lower abdominal pain, unspecified: Secondary | ICD-10-CM | POA: Insufficient documentation

## 2014-07-30 DIAGNOSIS — Z886 Allergy status to analgesic agent status: Secondary | ICD-10-CM

## 2014-07-30 DIAGNOSIS — Z79899 Other long term (current) drug therapy: Secondary | ICD-10-CM

## 2014-07-30 DIAGNOSIS — M1990 Unspecified osteoarthritis, unspecified site: Secondary | ICD-10-CM | POA: Diagnosis present

## 2014-07-30 DIAGNOSIS — Z0189 Encounter for other specified special examinations: Secondary | ICD-10-CM

## 2014-07-30 DIAGNOSIS — Z91040 Latex allergy status: Secondary | ICD-10-CM

## 2014-07-30 HISTORY — DX: Other chronic pain: G89.29

## 2014-07-30 HISTORY — DX: Major depressive disorder, single episode, unspecified: F32.9

## 2014-07-30 HISTORY — DX: Cardiac murmur, unspecified: R01.1

## 2014-07-30 HISTORY — DX: Low back pain: M54.5

## 2014-07-30 HISTORY — DX: Unspecified chronic bronchitis: J42

## 2014-07-30 HISTORY — DX: Insomnia, unspecified: G47.00

## 2014-07-30 HISTORY — DX: Unspecified asthma, uncomplicated: J45.909

## 2014-07-30 HISTORY — DX: Low back pain, unspecified: M54.50

## 2014-07-30 HISTORY — DX: Depression, unspecified: F32.A

## 2014-07-30 HISTORY — DX: Personal history of other medical treatment: Z92.89

## 2014-07-30 LAB — COMPREHENSIVE METABOLIC PANEL
ALT: 14 U/L (ref 14–54)
AST: 23 U/L (ref 15–41)
Albumin: 4.6 g/dL (ref 3.5–5.0)
Alkaline Phosphatase: 68 U/L (ref 38–126)
Anion gap: 12 (ref 5–15)
BUN: 11 mg/dL (ref 6–20)
CO2: 24 mmol/L (ref 22–32)
CREATININE: 0.91 mg/dL (ref 0.44–1.00)
Calcium: 10.2 mg/dL (ref 8.9–10.3)
Chloride: 104 mmol/L (ref 101–111)
GFR calc Af Amer: >60 mL/min (ref >60)
GFR calc non Af Amer: >60 mL/min (ref >60)
Glucose, Bld: 86 mg/dL (ref 65–99)
Potassium: 4.1 mmol/L (ref 3.5–5.1)
SODIUM: 140 mmol/L (ref 135–145)
Total Bilirubin: 0.5 mg/dL (ref 0.3–1.2)
Total Protein: 7.8 g/dL (ref 6.5–8.1)

## 2014-07-30 LAB — CBC WITH DIFFERENTIAL/PLATELET
BASOS ABS: 0 10*3/uL (ref 0.0–0.1)
BASOS PCT: 0 % (ref 0–1)
EOS PCT: 2 % (ref 0–5)
Eosinophils Absolute: 0.1 10*3/uL (ref 0.0–0.7)
HCT: 41.1 % (ref 36.0–46.0)
Hemoglobin: 13.6 g/dL (ref 12.0–15.0)
LYMPHS ABS: 2.1 10*3/uL (ref 0.7–4.0)
LYMPHS PCT: 41 % (ref 12–46)
MCH: 28.9 pg (ref 26.0–34.0)
MCHC: 33.1 g/dL (ref 30.0–36.0)
MCV: 87.4 fL (ref 78.0–100.0)
MONO ABS: 0.4 10*3/uL (ref 0.1–1.0)
Monocytes Relative: 7 % (ref 3–12)
NEUTROS ABS: 2.6 10*3/uL (ref 1.7–7.7)
NEUTROS PCT: 50 % (ref 43–77)
PLATELETS: 253 10*3/uL (ref 150–400)
RBC: 4.7 MIL/uL (ref 3.87–5.11)
RDW: 14.5 % (ref 11.5–15.5)
WBC: 5.3 10*3/uL (ref 4.0–10.5)

## 2014-07-30 LAB — LIPASE, BLOOD: LIPASE: 24 U/L (ref 22–51)

## 2014-07-30 MED ORDER — HYDROMORPHONE HCL 1 MG/ML IJ SOLN
0.5000 mg | Freq: Once | INTRAMUSCULAR | Status: AC
  Administered 2014-07-30: 0.5 mg via INTRAVENOUS
  Filled 2014-07-30: qty 1

## 2014-07-30 MED ORDER — IOHEXOL 300 MG/ML  SOLN
25.0000 mL | Freq: Once | INTRAMUSCULAR | Status: AC | PRN
  Administered 2014-07-30: 25 mL via ORAL

## 2014-07-30 MED ORDER — ONDANSETRON HCL 4 MG/2ML IJ SOLN
4.0000 mg | Freq: Once | INTRAMUSCULAR | Status: AC
  Administered 2014-07-30: 4 mg via INTRAVENOUS
  Filled 2014-07-30: qty 2

## 2014-07-30 NOTE — ED Notes (Signed)
Clarified with patient, she states she is not allergic to zofran.

## 2014-07-30 NOTE — ED Notes (Signed)
Patient is currently drinking contrast, instructed to alert staff when finished.

## 2014-07-30 NOTE — ED Notes (Signed)
Spoke to [name] in CT, patient has finisCoaldalehed drinking contrast. IV contrast not needed per Dr. Lowella BandyPheiffer. [name] with Ct acknowledges, will bring 2nd cup, as now the patient is a 2 hour drinker for CT scan.

## 2014-07-30 NOTE — ED Provider Notes (Signed)
CSN: [id number]     Arrival date & time 07/30/14  1813 History   First MD Initiated Contact with Patient 07/30/14 2110     Chief Complaint  Patient presents with  . Abdominal Pain     (Consider location/radiation/quality/duration/timing/severity/associated sxs/prior Treatment) HPI Patient reports she started developing abdominal pain yesterday evening. She states it's epigastric and central. It has both a sharp and a cramping quality. Patient has not had any vomiting in association with this. He has had several episodes of diarrhea. There has been no fever or urinary symptoms. Patient reports that she has had bowel obstruction in the past and she is concerned this may be a developing bowel obstruction. Past Medical History  Diagnosis Date  . COPD (chronic obstructive pulmonary disease)   . Hypertension   . GERD (gastroesophageal reflux disease)   . Osteoarthritis   . Cataract   . Hyperlipidemia    Past Surgical History  Procedure Laterality Date  . Abdominal hysterectomy  2003  . Knee surgery    . Bowel resection  2004    x2 with primary anastamosis  . Total knee arthroplasty Right     [facility]  . Laparoscopic incisional / umbilical / ventral hernia repair  2012    [facility], Dr. Carolynn Sayers  . Abdominal exploration surgery  2004    abdominal compartment syndrome  . Abdominal exploration surgery  2003    small bowel prolapse through vagina  . Diagnostic hysteroscopy with dilatation and curettage  2003   Family History  Problem Relation Age of Onset  . Cancer Mother   . Heart attack Father   . Colon cancer Maternal Grandmother   . Colon cancer Paternal Grandmother    History  Substance Use Topics  . Smoking status: Never Smoker   . Smokeless tobacco: Never Used  . Alcohol Use: No   OB History    No data available     Review of Systems 10 Systems reviewed and are negative for acute change except as noted in the HPI.   Allergies  Aspirin; Corn-containing products; Ibuprofen;  Latex; Morphine and related; Peanuts; Shellfish allergy; and Zofran  Home Medications   Prior to Admission medications   Medication Sig Start Date End Date Taking? Authorizing Provider  albuterol (PROVENTIL HFA;VENTOLIN HFA) 108 (90 BASE) MCG/ACT inhaler Inhale 1 puff into the lungs every 6 (six) hours as needed for wheezing or shortness of breath.    Historical Provider, MD  amLODipine (NORVASC) 10 MG tablet Take 10 mg by mouth daily.    Historical Provider, MD  diclofenac sodium (VOLTAREN) 1 % GEL Apply topically 4 (four) times daily.    Historical Provider, MD  Diclofenac Sodium 2 % SOLN Place onto the skin. One drop both eyes    Historical Provider, MD  diphenhydrAMINE (SOMINEX) 25 MG tablet Take 25 mg by mouth at bedtime as needed for sleep.    Historical Provider, MD  esomeprazole (NEXIUM) 40 MG capsule Take 40 mg by mouth daily at 12 noon.    Historical Provider, MD  Fluticasone-Salmeterol (ADVAIR) 500-50 MCG/DOSE AEPB Inhale 1 puff into the lungs 2 (two) times daily.    Historical Provider, MD  ipratropium (ATROVENT) 0.06 % nasal spray Place 2 sprays into the nose 4 (four) times daily. 05/05/13   Linna Hoff, MD  lisinopril (PRINIVIL,ZESTRIL) 20 MG tablet Take 20 mg by mouth daily.    Historical Provider, MD  loratadine (CLARITIN) 10 MG tablet Take 10 mg by mouth daily.  Historical Provider, MD  LORazepam (ATIVAN) 0.5 MG tablet Take 0.5 mg by mouth every 6 (six) hours as needed for anxiety.    Historical Provider, MD  mometasone-formoterol (DULERA) 100-5 MCG/ACT AERO Inhale 2 puffs into the lungs 2 (two) times daily. Dulera    Historical Provider, MD  naproxen (NAPROSYN) 500 MG tablet Take 500 mg by mouth 2 (two) times daily with a meal.    Historical Provider, MD  polyethylene glycol (MIRALAX / GLYCOLAX) packet Take 17 g by mouth daily.    Historical Provider, MD  promethazine (PHENERGAN) 25 MG tablet Take 25 mg by mouth. 08/20/13   Historical Provider, MD  Propylene Glycol (SYSTANE  BALANCE OP) Apply 1 drop to eye. Both eyes    Historical Provider, MD  rosuvastatin (CRESTOR) 10 MG tablet Take 10 mg by mouth daily.    Historical Provider, MD  sertraline (ZOLOFT) 100 MG tablet Take 100 mg by mouth daily.    Historical Provider, MD  tiotropium (SPIRIVA) 18 MCG inhalation capsule Place 18 mcg into inhaler and inhale daily.    Historical Provider, MD  traMADol (ULTRAM) 50 MG tablet Take 1 tablet (50 mg total) by mouth every 6 (six) hours as needed for moderate pain. 12/28/13   Linna HoffJames D Kindl, MD  zolpidem (AMBIEN) 10 MG tablet Take 10 mg by mouth at bedtime as needed for sleep.    Historical Provider, MD   BP 140/89 mmHg  Pulse 96  Temp(Src) 97.9 F (36.6 C) (Oral)  Resp 16  Ht 5\' 2"  (1.575 m)  Wt 195 lb (88.451 kg)  BMI 35.66 kg/m2  SpO2 100% Physical Exam  Constitutional: She is oriented to person, place, and time.  Awake, alert, nontoxic appearance. No respiratory distress.  HENT:  Head: Normocephalic and atraumatic.  Eyes: Right eye exhibits no discharge. Left eye exhibits no discharge.  Neck: Neck supple.  Cardiovascular: Normal rate, regular rhythm, normal heart sounds and intact distal pulses.   Pulmonary/Chest: Effort normal and breath sounds normal. She exhibits no tenderness.  Abdominal: Soft. There is tenderness. There is no rebound.  Moderate central tenderness to palpation without guarding or rebound.  Musculoskeletal: She exhibits no edema or tenderness.  Baseline ROM, no obvious new focal weakness.  Neurological: She is alert and oriented to person, place, and time. She exhibits normal muscle tone. Coordination normal.  Mental status and motor strength appears baseline for patient and situation.  Skin: Skin is warm and dry. No rash noted.  Psychiatric: She has a normal mood and affect.  Nursing note and vitals reviewed.   ED Course  Procedures (including critical care time) Labs Review Labs Reviewed  CBC WITH DIFFERENTIAL/PLATELET  COMPREHENSIVE  METABOLIC PANEL  LIPASE, BLOOD    Imaging Review No results found.   EKG Interpretation None      MDM   Final diagnoses:  None   Patient's CT is pending for review. Patient's concern and history is for possible small bowel obstruction. Pending results of CT patient's disposition will be made.    Arby BarretteMarcy Haevyn Ury, MD 08/01/14 [phone]

## 2014-07-30 NOTE — ED Notes (Signed)
Reported nausea to Dr. Lowella BandyPheiffer after dilaudid, and poor IV access. She acknolwedges, gives verbal order for 4mg  of zofran, and PO contrast for CT scan.

## 2014-07-30 NOTE — ED Notes (Signed)
2 missed attempts by this RN for iv access. Discussed with patient urine sample is needed.

## 2014-07-30 NOTE — ED Notes (Addendum)
Pt presents to department for evaluation of diffuse abdominal pain. Ongoing x2 days. Also states nausea and diarrhea. Pt is alert and oriented x4.

## 2014-07-30 NOTE — ED Notes (Signed)
MD at bedside. 

## 2014-07-30 NOTE — ED Notes (Signed)
Patient currently drinking 2nd contrast.

## 2014-07-31 ENCOUNTER — Emergency Department (HOSPITAL_COMMUNITY): Payer: Medicare HMO

## 2014-07-31 ENCOUNTER — Encounter (HOSPITAL_COMMUNITY): Payer: Self-pay | Admitting: Internal Medicine

## 2014-07-31 DIAGNOSIS — Z91010 Allergy to peanuts: Secondary | ICD-10-CM | POA: Diagnosis not present

## 2014-07-31 DIAGNOSIS — R1030 Lower abdominal pain, unspecified: Secondary | ICD-10-CM | POA: Diagnosis not present

## 2014-07-31 DIAGNOSIS — Z91013 Allergy to seafood: Secondary | ICD-10-CM | POA: Diagnosis not present

## 2014-07-31 DIAGNOSIS — Z96651 Presence of right artificial knee joint: Secondary | ICD-10-CM | POA: Diagnosis present

## 2014-07-31 DIAGNOSIS — Z885 Allergy status to narcotic agent status: Secondary | ICD-10-CM | POA: Diagnosis not present

## 2014-07-31 DIAGNOSIS — E669 Obesity, unspecified: Secondary | ICD-10-CM | POA: Diagnosis present

## 2014-07-31 DIAGNOSIS — J449 Chronic obstructive pulmonary disease, unspecified: Secondary | ICD-10-CM | POA: Diagnosis present

## 2014-07-31 DIAGNOSIS — M1990 Unspecified osteoarthritis, unspecified site: Secondary | ICD-10-CM | POA: Diagnosis present

## 2014-07-31 DIAGNOSIS — Z888 Allergy status to other drugs, medicaments and biological substances status: Secondary | ICD-10-CM | POA: Diagnosis not present

## 2014-07-31 DIAGNOSIS — E785 Hyperlipidemia, unspecified: Secondary | ICD-10-CM | POA: Diagnosis present

## 2014-07-31 DIAGNOSIS — R1013 Epigastric pain: Secondary | ICD-10-CM | POA: Diagnosis not present

## 2014-07-31 DIAGNOSIS — Z886 Allergy status to analgesic agent status: Secondary | ICD-10-CM | POA: Diagnosis not present

## 2014-07-31 DIAGNOSIS — Z91040 Latex allergy status: Secondary | ICD-10-CM | POA: Diagnosis not present

## 2014-07-31 DIAGNOSIS — J42 Unspecified chronic bronchitis: Secondary | ICD-10-CM | POA: Diagnosis not present

## 2014-07-31 DIAGNOSIS — Z91018 Allergy to other foods: Secondary | ICD-10-CM | POA: Diagnosis not present

## 2014-07-31 DIAGNOSIS — K5660 Unspecified intestinal obstruction: Secondary | ICD-10-CM | POA: Diagnosis present

## 2014-07-31 DIAGNOSIS — I10 Essential (primary) hypertension: Secondary | ICD-10-CM | POA: Diagnosis present

## 2014-07-31 DIAGNOSIS — K219 Gastro-esophageal reflux disease without esophagitis: Secondary | ICD-10-CM | POA: Diagnosis present

## 2014-07-31 DIAGNOSIS — Z91030 Bee allergy status: Secondary | ICD-10-CM | POA: Diagnosis not present

## 2014-07-31 DIAGNOSIS — Z90710 Acquired absence of both cervix and uterus: Secondary | ICD-10-CM | POA: Diagnosis not present

## 2014-07-31 DIAGNOSIS — Z6835 Body mass index (BMI) 35.0-35.9, adult: Secondary | ICD-10-CM | POA: Diagnosis not present

## 2014-07-31 DIAGNOSIS — H269 Unspecified cataract: Secondary | ICD-10-CM | POA: Diagnosis present

## 2014-07-31 DIAGNOSIS — Z79899 Other long term (current) drug therapy: Secondary | ICD-10-CM | POA: Diagnosis not present

## 2014-07-31 LAB — COMPREHENSIVE METABOLIC PANEL
ALBUMIN: 4.1 g/dL (ref 3.5–5.0)
ALK PHOS: 67 U/L (ref 38–126)
ALT: 14 U/L (ref 14–54)
ANION GAP: 8 (ref 5–15)
AST: 22 U/L (ref 15–41)
BUN: 10 mg/dL (ref 6–20)
CO2: 24 mmol/L (ref 22–32)
CREATININE: 0.78 mg/dL (ref 0.44–1.00)
Calcium: 9.6 mg/dL (ref 8.9–10.3)
Chloride: 106 mmol/L (ref 101–111)
GFR calc non Af Amer: >60 mL/min (ref >60)
Glucose, Bld: 70 mg/dL (ref 65–99)
Potassium: 4.3 mmol/L (ref 3.5–5.1)
Sodium: 138 mmol/L (ref 135–145)
Total Bilirubin: 0.7 mg/dL (ref 0.3–1.2)
Total Protein: 6.9 g/dL (ref 6.5–8.1)

## 2014-07-31 LAB — CBC WITH DIFFERENTIAL/PLATELET
BASOS PCT: 0 % (ref 0–1)
Basophils Absolute: 0 10*3/uL (ref 0.0–0.1)
Eosinophils Absolute: 0.1 10*3/uL (ref 0.0–0.7)
Eosinophils Relative: 2 % (ref 0–5)
HEMATOCRIT: 40.2 % (ref 36.0–46.0)
HEMOGLOBIN: 13.2 g/dL (ref 12.0–15.0)
Lymphocytes Relative: 39 % (ref 12–46)
Lymphs Abs: 1.8 10*3/uL (ref 0.7–4.0)
MCH: 29.3 pg (ref 26.0–34.0)
MCHC: 32.8 g/dL (ref 30.0–36.0)
MCV: 89.3 fL (ref 78.0–100.0)
Monocytes Absolute: 0.3 10*3/uL (ref 0.1–1.0)
Monocytes Relative: 6 % (ref 3–12)
NEUTROS ABS: 2.5 10*3/uL (ref 1.7–7.7)
Neutrophils Relative %: 53 % (ref 43–77)
Platelets: 202 10*3/uL (ref 150–400)
RBC: 4.5 MIL/uL (ref 3.87–5.11)
RDW: 14.6 % (ref 11.5–15.5)
WBC: 4.7 10*3/uL (ref 4.0–10.5)

## 2014-07-31 LAB — URINALYSIS W MICROSCOPIC (NOT AT ARMC)
Bilirubin Urine: NEGATIVE
GLUCOSE, UA: NEGATIVE mg/dL
HGB URINE DIPSTICK: NEGATIVE
KETONES UR: NEGATIVE mg/dL
NITRITE: NEGATIVE
PROTEIN: NEGATIVE mg/dL
Specific Gravity, Urine: 1.022 (ref 1.005–1.030)
Urobilinogen, UA: 1 mg/dL (ref 0.0–1.0)
pH: 5.5 (ref 5.0–8.0)

## 2014-07-31 LAB — GLUCOSE, CAPILLARY
GLUCOSE-CAPILLARY: 75 mg/dL (ref 65–99)
Glucose-Capillary: 62 mg/dL — ABNORMAL LOW (ref 65–99)
Glucose-Capillary: 97 mg/dL (ref 65–99)

## 2014-07-31 LAB — LIPASE, BLOOD: Lipase: 16 U/L — ABNORMAL LOW (ref 22–51)

## 2014-07-31 MED ORDER — LORAZEPAM 0.5 MG PO TABS: 0.5000 mg | ORAL_TABLET | Freq: Four times a day (QID) | ORAL | Status: DC | PRN

## 2014-07-31 MED ORDER — CETYLPYRIDINIUM CHLORIDE 0.05 % MT LIQD
7.0000 mL | Freq: Two times a day (BID) | OROMUCOSAL | Status: DC
  Administered 2014-08-01: 7 mL via OROMUCOSAL

## 2014-07-31 MED ORDER — DICLOFENAC SODIUM 1 % TD GEL
2.0000 g | Freq: Four times a day (QID) | TRANSDERMAL | Status: DC
  Administered 2014-07-31 – 2014-08-03 (×8): 2 g via TOPICAL
  Filled 2014-07-31: qty 100

## 2014-07-31 MED ORDER — TIOTROPIUM BROMIDE MONOHYDRATE 18 MCG IN CAPS
18.0000 ug | ORAL_CAPSULE | Freq: Every day | RESPIRATORY_TRACT | Status: DC
  Administered 2014-08-01 – 2014-08-03 (×3): 18 ug via RESPIRATORY_TRACT
  Filled 2014-07-31 (×2): qty 5

## 2014-07-31 MED ORDER — ALBUTEROL SULFATE (2.5 MG/3ML) 0.083% IN NEBU: 2.5000 mg | INHALATION_SOLUTION | Freq: Four times a day (QID) | RESPIRATORY_TRACT | Status: DC | PRN

## 2014-07-31 MED ORDER — SERTRALINE HCL 100 MG PO TABS
100.0000 mg | ORAL_TABLET | Freq: Every day | ORAL | Status: DC
  Administered 2014-07-31 – 2014-08-03 (×4): 100 mg via ORAL
  Filled 2014-07-31 (×4): qty 1

## 2014-07-31 MED ORDER — TRAZODONE HCL 100 MG PO TABS
100.0000 mg | ORAL_TABLET | Freq: Every evening | ORAL | Status: DC | PRN
  Administered 2014-07-31 – 2014-08-01 (×2): 100 mg via ORAL
  Filled 2014-07-31 (×2): qty 1

## 2014-07-31 MED ORDER — ZOLPIDEM TARTRATE 5 MG PO TABS: 5.0000 mg | ORAL_TABLET | Freq: Every evening | ORAL | Status: DC | PRN

## 2014-07-31 MED ORDER — ONDANSETRON HCL 4 MG/2ML IJ SOLN
4.0000 mg | Freq: Four times a day (QID) | INTRAMUSCULAR | Status: DC
  Administered 2014-07-31 – 2014-08-03 (×13): 4 mg via INTRAVENOUS
  Filled 2014-07-31 (×13): qty 2

## 2014-07-31 MED ORDER — ACETAMINOPHEN 325 MG PO TABS: 650.0000 mg | ORAL_TABLET | Freq: Four times a day (QID) | ORAL | Status: DC | PRN

## 2014-07-31 MED ORDER — PANTOPRAZOLE SODIUM 40 MG PO TBEC
40.0000 mg | DELAYED_RELEASE_TABLET | Freq: Every day | ORAL | Status: DC
  Administered 2014-07-31 – 2014-08-03 (×4): 40 mg via ORAL
  Filled 2014-07-31 (×4): qty 1

## 2014-07-31 MED ORDER — ROSUVASTATIN CALCIUM 10 MG PO TABS
10.0000 mg | ORAL_TABLET | Freq: Every day | ORAL | Status: DC
  Administered 2014-07-31 – 2014-08-03 (×4): 10 mg via ORAL
  Filled 2014-07-31 (×4): qty 1

## 2014-07-31 MED ORDER — AMLODIPINE BESYLATE 10 MG PO TABS
10.0000 mg | ORAL_TABLET | Freq: Every day | ORAL | Status: DC
  Administered 2014-07-31: 10 mg via ORAL
  Filled 2014-07-31 (×2): qty 1

## 2014-07-31 MED ORDER — TRAMADOL HCL 50 MG PO TABS
50.0000 mg | ORAL_TABLET | Freq: Four times a day (QID) | ORAL | Status: DC | PRN
Start: 2014-07-31 — End: 2015-03-04

## 2014-07-31 MED ORDER — LISINOPRIL 20 MG PO TABS
20.0000 mg | ORAL_TABLET | Freq: Every day | ORAL | Status: DC
  Administered 2014-07-31 – 2014-08-03 (×3): 20 mg via ORAL
  Filled 2014-07-31 (×4): qty 1

## 2014-07-31 MED ORDER — LORAZEPAM 2 MG/ML IJ SOLN
0.5000 mg | Freq: Once | INTRAMUSCULAR | Status: AC
  Administered 2014-07-31: 0.5 mg via INTRAVENOUS
  Filled 2014-07-31: qty 1

## 2014-07-31 MED ORDER — ACETAMINOPHEN 650 MG RE SUPP: 650.0000 mg | Freq: Four times a day (QID) | RECTAL | Status: DC | PRN

## 2014-07-31 MED ORDER — ALBUTEROL SULFATE HFA 108 (90 BASE) MCG/ACT IN AERS: 1.0000 | INHALATION_SPRAY | Freq: Four times a day (QID) | RESPIRATORY_TRACT | Status: DC | PRN

## 2014-07-31 MED ORDER — IPRATROPIUM BROMIDE 0.06 % NA SOLN
2.0000 | Freq: Four times a day (QID) | NASAL | Status: DC
  Administered 2014-08-01 – 2014-08-03 (×5): 2 via NASAL
  Filled 2014-07-31: qty 15

## 2014-07-31 MED ORDER — MOMETASONE FURO-FORMOTEROL FUM 200-5 MCG/ACT IN AERO
2.0000 | INHALATION_SPRAY | Freq: Two times a day (BID) | RESPIRATORY_TRACT | Status: DC
  Administered 2014-08-01 – 2014-08-03 (×5): 2 via RESPIRATORY_TRACT
  Filled 2014-07-31 (×2): qty 8.8

## 2014-07-31 MED ORDER — DEXTROSE-NACL 5-0.9 % IV SOLN
INTRAVENOUS | Status: AC
  Administered 2014-07-31: 14:00:00 via INTRAVENOUS

## 2014-07-31 MED ORDER — ZOLPIDEM TARTRATE 5 MG PO TABS: 10.0000 mg | ORAL_TABLET | Freq: Every evening | ORAL | Status: DC | PRN

## 2014-07-31 MED ORDER — HYDROMORPHONE HCL 1 MG/ML IJ SOLN
0.5000 mg | INTRAMUSCULAR | Status: DC | PRN
  Administered 2014-07-31 – 2014-08-01 (×3): 0.5 mg via INTRAVENOUS
  Filled 2014-07-31 (×3): qty 1

## 2014-07-31 MED ORDER — CHLORHEXIDINE GLUCONATE 0.12 % MT SOLN
15.0000 mL | Freq: Two times a day (BID) | OROMUCOSAL | Status: DC
  Administered 2014-07-31 – 2014-08-02 (×4): 15 mL via OROMUCOSAL
  Filled 2014-07-31 (×4): qty 15

## 2014-07-31 MED ORDER — ONDANSETRON HCL 4 MG/2ML IJ SOLN
4.0000 mg | Freq: Once | INTRAMUSCULAR | Status: AC
  Administered 2014-07-31: 4 mg via INTRAVENOUS
  Filled 2014-07-31: qty 2

## 2014-07-31 NOTE — ED Notes (Signed)
Attempted report 

## 2014-07-31 NOTE — ED Notes (Signed)
Dr. Wilkie AyeHorton in to update patient on CT results.

## 2014-07-31 NOTE — ED Notes (Signed)
Pt ambulated to bedside commode.

## 2014-07-31 NOTE — Consult Note (Signed)
Reason for Consult:abd pain Referring Physician: Dr Garlan Fair is an 54 y.o. female.  HPI: 5 yof who underwent hysterectomy 2003 resulting in small bowel prolapse through vagina resulting in sbr.  2004 had sbo with ischemic bowel and had sbr x 2.  Had ACS requiring decomp lap.  Had ec fistula and was eventually transferred to [facility] where this healed.  Unsure what she had done there.  She then several years ago in [place] had incisional hernia repair with mesh.  She was admitted here earlier this year with sbo that resolved conservatively. She now has several days abd pain worsening. No flatus or stool last 24 hours.  Nausea no emesis.  Did have some diarrhea prior.  She came to er and underwent ct that shows sbo   Past Medical History  Diagnosis Date  . COPD (chronic obstructive pulmonary disease)   . Hypertension   . GERD (gastroesophageal reflux disease)   . Osteoarthritis   . Cataract   . Hyperlipidemia     Past Surgical History  Procedure Laterality Date  . Abdominal hysterectomy  2003  . Knee surgery    . Bowel resection  2004    x2 with primary anastamosis  . Total knee arthroplasty Right     [facility]  . Laparoscopic incisional / umbilical / ventral hernia repair  2012    [facility], Dr. Abran Richard  . Abdominal exploration surgery  2004    abdominal compartment syndrome  . Abdominal exploration surgery  2003    small bowel prolapse through vagina  . Diagnostic hysteroscopy with dilatation and curettage  2003    Family History  Problem Relation Age of Onset  . Cancer Mother   . Heart attack Father   . Colon cancer Maternal Grandmother   . Colon cancer Paternal Grandmother     Social History:  reports that she has never smoked. She has never used smokeless tobacco. She reports that she does not drink alcohol or use illicit drugs.  Allergies:  Allergies  Allergen Reactions  . Bee Venom Anaphylaxis and Itching  . Peanuts [Peanut Oil] Anaphylaxis and Itching  .  Shellfish Allergy Anaphylaxis and Itching  . Aspirin Itching  . Corn-Containing Products Itching  . Ibuprofen Itching  . Latex Itching  . Morphine And Related Itching  . Zofran [Ondansetron Hcl] Itching and Rash    Medications: I have reviewed the patient's current medications.  Results for orders placed or performed during the hospital encounter of 07/30/14 (from the past 48 hour(s))  CBC with Differential     Status: None   Collection Time: 07/30/14  6:32 PM  Result Value Ref Range   WBC 5.3 4.0 - 10.5 K/uL   RBC 4.70 3.87 - 5.11 MIL/uL   Hemoglobin 13.6 12.0 - 15.0 g/dL   HCT 41.1 36.0 - 46.0 %   MCV 87.4 78.0 - 100.0 fL   MCH 28.9 26.0 - 34.0 pg   MCHC 33.1 30.0 - 36.0 g/dL   RDW 14.5 11.5 - 15.5 %   Platelets 253 150 - 400 K/uL   Neutrophils Relative % 50 43 - 77 %   Neutro Abs 2.6 1.7 - 7.7 K/uL   Lymphocytes Relative 41 12 - 46 %   Lymphs Abs 2.1 0.7 - 4.0 K/uL   Monocytes Relative 7 3 - 12 %   Monocytes Absolute 0.4 0.1 - 1.0 K/uL   Eosinophils Relative 2 0 - 5 %   Eosinophils Absolute 0.1 0.0 - 0.7  K/uL   Basophils Relative 0 0 - 1 %   Basophils Absolute 0.0 0.0 - 0.1 K/uL  Comprehensive metabolic panel     Status: None   Collection Time: 07/30/14  6:32 PM  Result Value Ref Range   Sodium 140 135 - 145 mmol/L   Potassium 4.1 3.5 - 5.1 mmol/L   Chloride 104 101 - 111 mmol/L   CO2 24 22 - 32 mmol/L   Glucose, Bld 86 65 - 99 mg/dL   BUN 11 6 - 20 mg/dL   Creatinine, Ser 0.91 0.44 - 1.00 mg/dL   Calcium 10.2 8.9 - 10.3 mg/dL   Total Protein 7.8 6.5 - 8.1 g/dL   Albumin 4.6 3.5 - 5.0 g/dL   AST 23 15 - 41 U/L   ALT 14 14 - 54 U/L   Alkaline Phosphatase 68 38 - 126 U/L   Total Bilirubin 0.5 0.3 - 1.2 mg/dL   GFR calc non Af Amer >60 >60 mL/min   GFR calc Af Amer >60 >60 mL/min    Comment: (NOTE) The eGFR has been calculated using the CKD EPI equation. This calculation has not been validated in all clinical situations. eGFR's persistently <60 mL/min signify  possible Chronic Kidney Disease.    Anion gap 12 5 - 15  Lipase, blood     Status: None   Collection Time: 07/30/14  6:32 PM  Result Value Ref Range   Lipase 24 22 - 51 U/L  Urinalysis with microscopic     Status: Abnormal   Collection Time: 07/31/14 12:00 AM  Result Value Ref Range   Color, Urine YELLOW YELLOW   APPearance CLOUDY (A) CLEAR   Specific Gravity, Urine 1.022 1.005 - 1.030   pH 5.5 5.0 - 8.0   Glucose, UA NEGATIVE NEGATIVE mg/dL   Hgb urine dipstick NEGATIVE NEGATIVE   Bilirubin Urine NEGATIVE NEGATIVE   Ketones, ur NEGATIVE NEGATIVE mg/dL   Protein, ur NEGATIVE NEGATIVE mg/dL   Urobilinogen, UA 1.0 0.0 - 1.0 mg/dL   Nitrite NEGATIVE NEGATIVE   Leukocytes, UA SMALL (A) NEGATIVE   WBC, UA 7-10 <3 WBC/hpf   RBC / HPF 0-2 <3 RBC/hpf   Bacteria, UA RARE RARE   Squamous Epithelial / LPF FEW (A) RARE   Urine-Other MUCOUS PRESENT     Ct Abdomen Pelvis Wo Contrast  07/31/2014   CLINICAL DATA:  Abdominal pain.  EXAM: CT ABDOMEN AND PELVIS WITHOUT CONTRAST  TECHNIQUE: Multidetector CT imaging of the abdomen and pelvis was performed following the standard protocol without IV contrast.  COMPARISON:  03/22/2014  FINDINGS: There is marked dilatation of an abnormal small bowel loop in anterior abdomen. The degree of dilatation is greater than on 03/22/2014 over 11 cm. The small bowel distal to the abnormal segment is decompressed. The colon is normal in caliber. There is no extraluminal air. There is no ascites. There are grossly unremarkable unenhanced appearances of the liver, spleen, pancreas, adrenals and kidneys.  The abdominal aorta is normal in caliber with minimal atherosclerotic calcification.  There is no significant abnormality in the lower chest. There is no significant musculoskeletal abnormality.  IMPRESSION: Marked dilatation of an abnormal segment of small bowel. Oral contrast did not reach the colon, and the small bowel distal to the abnormal segment is decompressed,  consistent with small bowel obstruction. The degree of small bowel dilatation is greater than on 03/22/2014.   Electronically Signed   By: Andreas Newport M.D.   On: 07/31/2014 02:30  Review of Systems  Gastrointestinal: Positive for nausea and abdominal pain. Negative for vomiting.   Blood pressure 116/67, pulse 79, temperature 97.9 F (36.6 C), temperature source Oral, resp. rate 14, height _0  (1.575 m), weight 88.451 kg (195 lb), SpO2 100 %. Physical Exam  Vitals reviewed. Constitutional: She is oriented to person, place, and time. She appears well-developed and well-nourished.  Neck: Neck supple.  Cardiovascular: Normal rate, regular rhythm and normal heart sounds.   Respiratory: Effort normal and breath sounds normal. She has no wheezes.  GI: Soft. She exhibits distension (difficult to tell but feels like she is mod distended). Bowel sounds are decreased. There is tenderness (mild throughout). No hernia.    Neurological: She is alert and oriented to person, place, and time.    Assessment/Plan: SBO  Significant on ct scan but clinically not as bad, no indication for surgery now but concerning that had episode earlier this year.  Will place ng tube and follow with medical team.    [facility] 07/31/2014, 6:30 AM

## 2014-07-31 NOTE — Progress Notes (Signed)
Report received from [place] Mount[place]ie, [place] via [place].

## 2014-07-31 NOTE — ED Notes (Signed)
See downtime charting. 

## 2014-07-31 NOTE — Progress Notes (Signed)
Patient seen and examined And agree with Eduard ClosArshad N Kakrakandy, MD assessment and plan  Reviewed general surgery's recommendation, no indication for surgery at this time Replace NG tube

## 2014-07-31 NOTE — ED Notes (Signed)
1st attempt to place ng tube with use of numbing spray and de-escalating measures. Patient still anxious, and beginning to hyperventilate. notifiying Dr. Wilkie AyeHorton of anxiety.

## 2014-07-31 NOTE — H&P (Signed)
[facility] History and Physical  Jeanine Caven Schumacher ZOX:[id number] DOB: 1960/08/16 DOA: 07/30/2014  Referring physician: Dr.Horton. PCP: Diamantina Providence., FNP  Specialists: None.  Chief Complaint: Abdominal pain.  HPI: [name] is a 54 y.o. female with a history of COPD, hypertension, hyperlipidemia who was admitted last February for partial small bowel obstruction presents to the ER because of abdominal pain with diarrhea over the last 4-5 days. Patient denies any recent use of antibiotics, travel or sick contacts. Patient had some nausea but denies vomiting. Pain is mostly in the upper part of the abdomen. Pain is constant and nonradiating. In the ER CT abdomen and pelvis shows possible partial small bowel obstruction and on-call surgeon Dr. Dwain Sarna has been consulted. Patient is admitted for further management. Patient denies any chest pain or shortness of breath. Denies any blood in the diarrhea.  Review of Systems: As presented in the history of presenting illness, rest negative.  Past Medical History  Diagnosis Date  . COPD (chronic obstructive pulmonary disease)   . Hypertension   . GERD (gastroesophageal reflux disease)   . Osteoarthritis   . Cataract   . Hyperlipidemia    Past Surgical History  Procedure Laterality Date  . Abdominal hysterectomy  2003  . Knee surgery    . Bowel resection  2004    x2 with primary anastamosis  . Total knee arthroplasty Right     [facility]  . Laparoscopic incisional / umbilical / ventral hernia repair  2012    [facility], Dr. Carolynn Sayers  . Abdominal exploration surgery  2004    abdominal compartment syndrome  . Abdominal exploration surgery  2003    small bowel prolapse through vagina  . Diagnostic hysteroscopy with dilatation and curettage  2003   Social History:  reports that she has never smoked. She has never used smokeless tobacco. She reports that she does not drink alcohol or use illicit drugs. Where does patient live at  home. Can patient participate in ADLs? Yes.  Allergies  Allergen Reactions  . Bee Venom Anaphylaxis and Itching  . Peanuts [Peanut Oil] Anaphylaxis and Itching  . Shellfish Allergy Anaphylaxis and Itching  . Aspirin Itching  . Corn-Containing Products Itching  . Ibuprofen Itching  . Latex Itching  . Morphine And Related Itching  . Zofran [Ondansetron Hcl] Itching and Rash    Family History:  Family History  Problem Relation Age of Onset  . Cancer Mother   . Heart attack Father   . Colon cancer Maternal Grandmother   . Colon cancer Paternal Grandmother       Prior to Admission medications   Medication Sig Start Date End Date Taking? Authorizing Provider  albuterol (PROVENTIL HFA;VENTOLIN HFA) 108 (90 BASE) MCG/ACT inhaler Inhale 1 puff into the lungs every 6 (six) hours as needed for wheezing or shortness of breath.   Yes Historical Provider, MD  amLODipine (NORVASC) 10 MG tablet Take 10 mg by mouth daily.   Yes Historical Provider, MD  diclofenac sodium (VOLTAREN) 1 % GEL Apply topically 4 (four) times daily.   Yes Historical Provider, MD  diphenhydrAMINE (SOMINEX) 25 MG tablet Take 25 mg by mouth at bedtime as needed for itching or sleep.    Yes Historical Provider, MD  esomeprazole (NEXIUM) 40 MG capsule Take 40 mg by mouth daily at 12 noon.   Yes Historical Provider, MD  Fluticasone-Salmeterol (ADVAIR) 500-50 MCG/DOSE AEPB Inhale 1 puff into the lungs 2 (two) times daily.   Yes Historical Provider, MD  ipratropium (ATROVENT) 0.06 % nasal spray Place 2 sprays into the nose 4 (four) times daily. 05/05/13  Yes Linna HoffJames D Kindl, MD  lisinopril (PRINIVIL,ZESTRIL) 20 MG tablet Take 20 mg by mouth daily.   Yes Historical Provider, MD  loratadine (CLARITIN) 10 MG tablet Take 10 mg by mouth daily.   Yes Historical Provider, MD  LORazepam (ATIVAN) 0.5 MG tablet Take 0.5 mg by mouth every 6 (six) hours as needed for anxiety.   Yes Historical Provider, MD  Multiple Vitamin (MULTIVITAMIN WITH  MINERALS) TABS tablet Take 1 tablet by mouth daily.   Yes Historical Provider, MD  naproxen (NAPROSYN) 500 MG tablet Take 500 mg by mouth 2 (two) times daily as needed for moderate pain.    Yes Historical Provider, MD  polyethylene glycol (MIRALAX / GLYCOLAX) packet Take 17 g by mouth daily as needed for moderate constipation.    Yes Historical Provider, MD  promethazine (PHENERGAN) 25 MG tablet Take 25 mg by mouth every 8 (eight) hours as needed for nausea or vomiting.  08/20/13  Yes Historical Provider, MD  Propylene Glycol (SYSTANE BALANCE OP) Place 1 drop into the right eye daily. Both eyes   Yes Historical Provider, MD  rosuvastatin (CRESTOR) 10 MG tablet Take 10 mg by mouth daily.   Yes Historical Provider, MD  sertraline (ZOLOFT) 100 MG tablet Take 100 mg by mouth daily.   Yes Historical Provider, MD  tiotropium (SPIRIVA) 18 MCG inhalation capsule Place 18 mcg into inhaler and inhale daily.   Yes Historical Provider, MD  traMADol (ULTRAM) 50 MG tablet Take 1 tablet (50 mg total) by mouth every 6 (six) hours as needed for moderate pain. 12/28/13  Yes Linna HoffJames D Kindl, MD  traZODone (DESYREL) 100 MG tablet Take 100 mg by mouth at bedtime as needed. 06/09/14  Yes Historical Provider, MD  zolpidem (AMBIEN) 10 MG tablet Take 10 mg by mouth at bedtime as needed for sleep.   Yes Historical Provider, MD  traMADol (ULTRAM) 50 MG tablet Take 1 tablet (50 mg total) by mouth every 6 (six) hours as needed. 07/31/14   Arby BarretteMarcy Pfeiffer, MD    Physical Exam: Filed Vitals:   07/31/14 0230 07/31/14 0300 07/31/14 0302 07/31/14 0315  BP: 110/50 113/68 113/68 116/56  Pulse: 76 78 81 71  Temp:      TempSrc:      Resp:   14   Height:      Weight:      SpO2: 100% 100% 100% 100%     General:  Moderately built and nourished.  Eyes: Anicteric no pallor.  ENT: No discharge from the ears eyes nose or mouth.  Neck: No mass felt.  Cardiovascular: S1-S2 heard.  Respiratory: No rhonchi or crepitations.  Abdomen:  Soft nontender bowel sounds not appreciated. Mildly distended. No guarding or rigidity.  Skin: No rash.  Musculoskeletal: No edema.  Psychiatric: Appears normal.  Neurologic: Alert awake oriented to time place and person. Moves all extremities.  Labs on Admission:  Basic Metabolic Panel:  Recent Labs Lab 07/30/14 1832  NA 140  K 4.1  CL 104  CO2 24  GLUCOSE 86  BUN 11  CREATININE 0.91  CALCIUM 10.2   Liver Function Tests:  Recent Labs Lab 07/30/14 1832  AST 23  ALT 14  ALKPHOS 68  BILITOT 0.5  PROT 7.8  ALBUMIN 4.6    Recent Labs Lab 07/30/14 1832  LIPASE 24   No results for input(s): AMMONIA in the last 168 hours.  CBC:  Recent Labs Lab 07/30/14 1832  WBC 5.3  NEUTROABS 2.6  HGB 13.6  HCT 41.1  MCV 87.4  PLT 253   Cardiac Enzymes: No results for input(s): CKTOTAL, CKMB, CKMBINDEX, TROPONINI in the last 168 hours.  BNP (last 3 results) No results for input(s): BNP in the last 8760 hours.  ProBNP (last 3 results) No results for input(s): PROBNP in the last 8760 hours.  CBG: No results for input(s): GLUCAP in the last 168 hours.  Radiological Exams on Admission: Ct Abdomen Pelvis Wo Contrast  07/31/2014   CLINICAL DATA:  Abdominal pain.  EXAM: CT ABDOMEN AND PELVIS WITHOUT CONTRAST  TECHNIQUE: Multidetector CT imaging of the abdomen and pelvis was performed following the standard protocol without IV contrast.  COMPARISON:  03/22/2014  FINDINGS: There is marked dilatation of an abnormal small bowel loop in anterior abdomen. The degree of dilatation is greater than on 03/22/2014 over 11 cm. The small bowel distal to the abnormal segment is decompressed. The colon is normal in caliber. There is no extraluminal air. There is no ascites. There are grossly unremarkable unenhanced appearances of the liver, spleen, pancreas, adrenals and kidneys.  The abdominal aorta is normal in caliber with minimal atherosclerotic calcification.  There is no significant  abnormality in the lower chest. There is no significant musculoskeletal abnormality.  IMPRESSION: Marked dilatation of an abnormal segment of small bowel. Oral contrast did not reach the colon, and the small bowel distal to the abnormal segment is decompressed, consistent with small bowel obstruction. The degree of small bowel dilatation is greater than on 03/22/2014.   Electronically Signed   By: Ellery Plunk M.D.   On: 07/31/2014 02:30     Assessment/Plan Principal Problem:   Abdominal pain Active Problems:   COPD (chronic obstructive pulmonary disease)   Hypertension   Hyperlipidemia   1. Abdominal pain with nausea and diarrhea - CT scan shows features concerning for partial small bowel obstruction. At this time G-tube has been ordered. Patient will be kept nothing by mouth except medications. Surgery has been consulted. Gentle hydration pain relief medications. 2. COPD - presently not wheezing. Continue inhalers. 3. Hypertension - we'll continue home medications. 4. Hyperlipidemia - on statins.  I have reviewed patient's old charts.   DVT Prophylaxis SCDs in anticipation of possible procedure.  Code Status: Full code.  Family Communication: Discussed with patient.  Disposition Plan: Admit to inpatient.    Natelie Ostrosky N. [facility] Pager [phone].  If 7PM-7AM, please contact night-coverage [email or web address] Password [facility] 07/31/2014, 4:47 AM

## 2014-07-31 NOTE — ED Notes (Addendum)
Pt accidentally pulled out NG tube.  MD paged for nausea medication.

## 2014-07-31 NOTE — Care Management Note (Signed)
Case Management Note  Patient Details  Name: [name] MRN: [id number] Date of Birth: 01/06/1961  Subjective/Objective:                    Action/Plan: UR completed   Expected Discharge Date:       08-02-14           Expected Discharge Plan:  Home/Self Care  In-House Referral:     Discharge planning Services     Post Acute Care Choice:    Choice offered to:     DME Arranged:    DME Agency:     HH Arranged:    HH Agency:     Status of Service:  In process, will continue to follow  Medicare Important Message Given:    Date Medicare IM Given:    Medicare IM give by:    Date Additional Medicare IM Given:    Additional Medicare Important Message give by:     If discussed at Long Length of Stay Meetings, dates discussed:    Additional Comments:  [name], Zoe Bolinger Marie, RN 07/31/2014, 3:51 PM

## 2014-07-31 NOTE — Discharge Instructions (Signed)

## 2014-07-31 NOTE — ED Provider Notes (Addendum)
Signed out by Dr. Clarice PolePfeifer. Pending CT scan.  Patient with history of SBO. Presents with nausea, abdominal pain, and diarrhea. Concern for recurrence. Tender abdomen but no signs of peritonitis. Patient reports improvement with symptomatic care. CT scan does show concern for acute SBO. Surgery consulted.  3:13 AM Discussed with Dr. Dwain SarnaWakefield. Will admit to medicine with surgery consultation.  Results for orders placed or performed during the hospital encounter of 07/30/14  CBC with Differential  Result Value Ref Range   WBC 5.3 4.0 - 10.5 K/uL   RBC 4.70 3.87 - 5.11 MIL/uL   Hemoglobin 13.6 12.0 - 15.0 g/dL   HCT 47.841.1 29.536.0 - 62.146.0 %   MCV 87.4 78.0 - 100.0 fL   MCH 28.9 26.0 - 34.0 pg   MCHC 33.1 30.0 - 36.0 g/dL   RDW 30.814.5 65.711.5 - 84.615.5 %   Platelets 253 150 - 400 K/uL   Neutrophils Relative % 50 43 - 77 %   Neutro Abs 2.6 1.7 - 7.7 K/uL   Lymphocytes Relative 41 12 - 46 %   Lymphs Abs 2.1 0.7 - 4.0 K/uL   Monocytes Relative 7 3 - 12 %   Monocytes Absolute 0.4 0.1 - 1.0 K/uL   Eosinophils Relative 2 0 - 5 %   Eosinophils Absolute 0.1 0.0 - 0.7 K/uL   Basophils Relative 0 0 - 1 %   Basophils Absolute 0.0 0.0 - 0.1 K/uL  Comprehensive metabolic panel  Result Value Ref Range   Sodium 140 135 - 145 mmol/L   Potassium 4.1 3.5 - 5.1 mmol/L   Chloride 104 101 - 111 mmol/L   CO2 24 22 - 32 mmol/L   Glucose, Bld 86 65 - 99 mg/dL   BUN 11 6 - 20 mg/dL   Creatinine, Ser 9.620.91 0.44 - 1.00 mg/dL   Calcium 95.210.2 8.9 - 84.110.3 mg/dL   Total Protein 7.8 6.5 - 8.1 g/dL   Albumin 4.6 3.5 - 5.0 g/dL   AST 23 15 - 41 U/L   ALT 14 14 - 54 U/L   Alkaline Phosphatase 68 38 - 126 U/L   Total Bilirubin 0.5 0.3 - 1.2 mg/dL   GFR calc non Af Amer >60 >60 mL/min   GFR calc Af Amer >60 >60 mL/min   Anion gap 12 5 - 15  Lipase, blood  Result Value Ref Range   Lipase 24 22 - 51 U/L  Urinalysis with microscopic  Result Value Ref Range   Color, Urine YELLOW YELLOW   APPearance CLOUDY (A) CLEAR   Specific Gravity, Urine 1.022 1.005 - 1.030   pH 5.5 5.0 - 8.0   Glucose, UA NEGATIVE NEGATIVE mg/dL   Hgb urine dipstick NEGATIVE NEGATIVE   Bilirubin Urine NEGATIVE NEGATIVE   Ketones, ur NEGATIVE NEGATIVE mg/dL   Protein, ur NEGATIVE NEGATIVE mg/dL   Urobilinogen, UA 1.0 0.0 - 1.0 mg/dL   Nitrite NEGATIVE NEGATIVE   Leukocytes, UA SMALL (A) NEGATIVE   WBC, UA 7-10 <3 WBC/hpf   RBC / HPF 0-2 <3 RBC/hpf   Bacteria, UA RARE RARE   Squamous Epithelial / LPF FEW (A) RARE   Urine-Other MUCOUS PRESENT    Ct Abdomen Pelvis Wo Contrast  07/31/2014   CLINICAL DATA:  Abdominal pain.  EXAM: CT ABDOMEN AND PELVIS WITHOUT CONTRAST  TECHNIQUE: Multidetector CT imaging of the abdomen and pelvis was performed following the standard protocol without IV contrast.  COMPARISON:  03/22/2014  FINDINGS: There is marked dilatation of an abnormal small  bowel loop in anterior abdomen. The degree of dilatation is greater than on 03/22/2014 over 11 cm. The small bowel distal to the abnormal segment is decompressed. The colon is normal in caliber. There is no extraluminal air. There is no ascites. There are grossly unremarkable unenhanced appearances of the liver, spleen, pancreas, adrenals and kidneys.  The abdominal aorta is normal in caliber with minimal atherosclerotic calcification.  There is no significant abnormality in the lower chest. There is no significant musculoskeletal abnormality.  IMPRESSION: Marked dilatation of an abnormal segment of small bowel. Oral contrast did not reach the colon, and the small bowel distal to the abnormal segment is decompressed, consistent with small bowel obstruction. The degree of small bowel dilatation is greater than on 03/22/2014.   Electronically Signed   By: Ellery Plunk M.D.   On: 07/31/2014 02:30      Shon Baton, MD 07/31/14 1610  Shon Baton, MD 07/31/14 [phone]

## 2014-07-31 NOTE — ED Notes (Signed)
Reported to Dr. Wilkie AyeHorton patient complains of being nauseous. She allows repeat of zofran IV.

## 2014-08-01 ENCOUNTER — Encounter (HOSPITAL_COMMUNITY): Payer: Self-pay | Admitting: General Practice

## 2014-08-01 ENCOUNTER — Inpatient Hospital Stay (HOSPITAL_COMMUNITY): Payer: Medicare HMO

## 2014-08-01 DIAGNOSIS — R1030 Lower abdominal pain, unspecified: Secondary | ICD-10-CM | POA: Insufficient documentation

## 2014-08-01 DIAGNOSIS — R1032 Left lower quadrant pain: Secondary | ICD-10-CM | POA: Insufficient documentation

## 2014-08-01 LAB — GLUCOSE, CAPILLARY
Glucose-Capillary: 81 mg/dL (ref 65–99)
Glucose-Capillary: 93 mg/dL (ref 65–99)
Glucose-Capillary: 122 mg/dL — ABNORMAL HIGH (ref 65–99)

## 2014-08-01 MED ORDER — ENOXAPARIN SODIUM 40 MG/0.4ML ~~LOC~~ SOLN
40.0000 mg | SUBCUTANEOUS | Status: DC
  Administered 2014-08-01 – 2014-08-03 (×3): 40 mg via SUBCUTANEOUS
  Filled 2014-08-01 (×3): qty 0.4

## 2014-08-01 MED ORDER — DIPHENHYDRAMINE HCL 25 MG PO CAPS
50.0000 mg | ORAL_CAPSULE | Freq: Once | ORAL | Status: AC
  Administered 2014-08-01: 50 mg via ORAL
  Filled 2014-08-01: qty 2

## 2014-08-01 MED ORDER — BISACODYL 10 MG RE SUPP
10.0000 mg | Freq: Once | RECTAL | Status: AC
  Administered 2014-08-01: 10 mg via RECTAL
  Filled 2014-08-01: qty 1

## 2014-08-01 MED ORDER — DIATRIZOATE MEGLUMINE & SODIUM 66-10 % PO SOLN: 90.0000 mL | Freq: Once | ORAL | Status: DC

## 2014-08-01 MED ORDER — DIPHENHYDRAMINE HCL 25 MG PO CAPS: 25.0000 mg | ORAL_CAPSULE | ORAL | Status: DC | PRN

## 2014-08-01 NOTE — Progress Notes (Signed)
[facility] PROGRESS NOTE  [name] IRC:[id number] DOB: 24-Nov-1960 DOA: 07/30/2014 PCP: Diamantina Providence., FNP  Assessment/Plan: Principal Problem:   Abdominal pain Active Problems:   COPD (chronic obstructive pulmonary disease)   Hypertension   Hyperlipidemia   Abdominal pain, lower    1. Abdominal pain with nausea and diarrhea - CT scan shows features concerning for partial small bowel obstruction. KUB pending from this morning, NG tube in place,. Surgery following an diet to be advanced based on surgery recommendations, continue by mouth medications. Surgery has been consulted. Trial of Dulcolax suppository? 2. COPD - presently not wheezing. Continue inhalers. 3. Hypertension - discontinue Norvasc as her blood pressure is soft 4. Hyperlipidemia - on statins.  Code Status: full Family Communication: family updated about patient's clinical progress Disposition Plan:  As above    Brief narrative: 53 y.o. female with a history of COPD, hypertension, hyperlipidemia who was admitted last February for partial small bowel obstruction presents to the ER because of abdominal pain with diarrhea over the last 4-5 days. Patient denies any recent use of antibiotics, travel or sick contacts. Patient had some nausea but denies vomiting. Pain is mostly in the upper part of the abdomen. Pain is constant and nonradiating. In the ER CT abdomen and pelvis shows possible partial small bowel obstruction and on-call surgeon Dr. Dwain Sarna has been consulted. Patient is admitted for further management. Patient denies any chest pain or shortness of breath. Denies any blood in the diarrhea.   Consultants:  General surgery   Procedures-None  Antibiotics: Anti-infectives    None       HPI/Subjective: Denies any nausea vomiting abdominal pain. Ambulating well. Wants NG out and wants to eat. KUB pending.   Objective: Filed Vitals:   07/31/14 1836 07/31/14 2207 08/01/14 0543  08/01/14 0943  BP: 99/55 102/59 108/60 91/52  Pulse: 86 83 71   Temp:  97.9 F (36.6 C) 98.6 F (37 C)   TempSrc:  Oral Oral   Resp:  16 16   Height:      Weight:      SpO2:  97% 99%     Intake/Output Summary (Last 24 hours) at 08/01/14 1144 Last data filed at 08/01/14 0908  Gross per 24 hour  Intake   3659 ml  Output     70 ml  Net   3589 ml    Exam:  General: No acute respiratory distress Lungs: Clear to auscultation bilaterally without wheezes or crackles Cardiovascular: Regular rate and rhythm without murmur gallop or rub normal S1 and S2 Abdomen: Nontender, nondistended, soft, bowel sounds positive, no rebound, no ascites, no appreciable mass Extremities: No significant cyanosis, clubbing, or edema bilateral lower extremities     Data Review   Micro Results No results found for this or any previous visit (from the past 240 hour(s)).  Radiology Reports Ct Abdomen Pelvis Wo Contrast  07/31/2014   CLINICAL DATA:  Abdominal pain.  EXAM: CT ABDOMEN AND PELVIS WITHOUT CONTRAST  TECHNIQUE: Multidetector CT imaging of the abdomen and pelvis was performed following the standard protocol without IV contrast.  COMPARISON:  03/22/2014  FINDINGS: There is marked dilatation of an abnormal small bowel loop in anterior abdomen. The degree of dilatation is greater than on 03/22/2014 over 11 cm. The small bowel distal to the abnormal segment is decompressed. The colon is normal in caliber. There is no extraluminal air. There is no ascites. There are grossly unremarkable unenhanced appearances of the  liver, spleen, pancreas, adrenals and kidneys.  The abdominal aorta is normal in caliber with minimal atherosclerotic calcification.  There is no significant abnormality in the lower chest. There is no significant musculoskeletal abnormality.  IMPRESSION: Marked dilatation of an abnormal segment of small bowel. Oral contrast did not reach the colon, and the small bowel distal to the abnormal  segment is decompressed, consistent with small bowel obstruction. The degree of small bowel dilatation is greater than on 03/22/2014.   Electronically Signed   By: Ellery Plunk M.D.   On: 07/31/2014 02:30     CBC  Recent Labs Lab 07/30/14 1832 07/31/14 1307  WBC 5.3 4.7  HGB 13.6 13.2  HCT 41.1 40.2  PLT 253 202  MCV 87.4 89.3  MCH 28.9 29.3  MCHC 33.1 32.8  RDW 14.5 14.6  LYMPHSABS 2.1 1.8  MONOABS 0.4 0.3  EOSABS 0.1 0.1  BASOSABS 0.0 0.0    Chemistries   Recent Labs Lab 07/30/14 1832 07/31/14 1307  NA 140 138  K 4.1 4.3  CL 104 106  CO2 24 24  GLUCOSE 86 70  BUN 11 10  CREATININE 0.91 0.78  CALCIUM 10.2 9.6  AST 23 22  ALT 14 14  ALKPHOS 68 67  BILITOT 0.5 0.7   ------------------------------------------------------------------------------------------------------------------ estimated creatinine clearance is 84.1 mL/min (by C-G formula based on Cr of 0.78). ------------------------------------------------------------------------------------------------------------------ No results for input(s): HGBA1C in the last 72 hours. ------------------------------------------------------------------------------------------------------------------ No results for input(s): CHOL, HDL, LDLCALC, TRIG, CHOLHDL, LDLDIRECT in the last 72 hours. ------------------------------------------------------------------------------------------------------------------ No results for input(s): TSH, T4TOTAL, T3FREE, THYROIDAB in the last 72 hours.  Invalid input(s): FREET3 ------------------------------------------------------------------------------------------------------------------ No results for input(s): VITAMINB12, FOLATE, FERRITIN, TIBC, IRON, RETICCTPCT in the last 72 hours.  Coagulation profile No results for input(s): INR, PROTIME in the last 168 hours.  No results for input(s): DDIMER in the last 72 hours.  Cardiac Enzymes No results for input(s): CKMB, TROPONINI,  MYOGLOBIN in the last 168 hours.  Invalid input(s): CK ------------------------------------------------------------------------------------------------------------------ Invalid input(s): POCBNP   CBG:  Recent Labs Lab 07/31/14 1725 07/31/14 1743 07/31/14 2353 08/01/14 0542  GLUCAP 62* 75 97 81      No results found for this or any previous visit (from the past 240 hour(s)).     Studies: Ct Abdomen Pelvis Wo Contrast  07/31/2014   CLINICAL DATA:  Abdominal pain.  EXAM: CT ABDOMEN AND PELVIS WITHOUT CONTRAST  TECHNIQUE: Multidetector CT imaging of the abdomen and pelvis was performed following the standard protocol without IV contrast.  COMPARISON:  03/22/2014  FINDINGS: There is marked dilatation of an abnormal small bowel loop in anterior abdomen. The degree of dilatation is greater than on 03/22/2014 over 11 cm. The small bowel distal to the abnormal segment is decompressed. The colon is normal in caliber. There is no extraluminal air. There is no ascites. There are grossly unremarkable unenhanced appearances of the liver, spleen, pancreas, adrenals and kidneys.  The abdominal aorta is normal in caliber with minimal atherosclerotic calcification.  There is no significant abnormality in the lower chest. There is no significant musculoskeletal abnormality.  IMPRESSION: Marked dilatation of an abnormal segment of small bowel. Oral contrast did not reach the colon, and the small bowel distal to the abnormal segment is decompressed, consistent with small bowel obstruction. The degree of small bowel dilatation is greater than on 03/22/2014.   Electronically Signed   By: Ellery Plunk M.D.   On: 07/31/2014 02:30      No results found for: HGBA1C  Lab Results  Component Value Date   CREATININE 0.78 07/31/2014       Scheduled Meds: . antiseptic oral rinse  7 mL Mouth Rinse q12n4p  . chlorhexidine  15 mL Mouth Rinse BID  . diclofenac sodium  2 g Topical QID  . ipratropium  2  spray Nasal QID  . lisinopril  20 mg Oral Daily  . mometasone-formoterol  2 puff Inhalation BID  . ondansetron (ZOFRAN) IV  4 mg Intravenous 4 times per day  . pantoprazole  40 mg Oral Daily  . rosuvastatin  10 mg Oral Daily  . sertraline  100 mg Oral Daily  . tiotropium  18 mcg Inhalation Daily   Continuous Infusions: . dextrose 5 % and 0.9% NaCl 75 mL/hr at 07/31/14 1416    Principal Problem:   Abdominal pain Active Problems:   COPD (chronic obstructive pulmonary disease)   Hypertension   Hyperlipidemia   Abdominal pain, lower    Time spent: 45 minutes   [facility]  [facility] Pager [phone]. If 7PM-7AM, please contact night-coverage at [email or web address], password [email or web address] 08/01/2014, 11:44 AM  LOS: 1 day

## 2014-08-01 NOTE — Progress Notes (Signed)
Consulted for Lovenox for VTE prophylaxis.  Patient does not have elevated BMI, and does not have any renal failure.  She is ok for standard Lovenox dosing of 40mg  subQ q24h for VTE prophylaxis.  Pharmacy will sign off. Thanks,  Eliott Nine. Leahanna Buser, PharmD, BCPS Clinical Pharmacist Pager: [phone] 08/01/2014 11:55 AM

## 2014-08-01 NOTE — Progress Notes (Signed)
[facility] Progress Note     Subjective: Pt says she feels great.  She says her pain, nausea and distension is resolved.  She's passing a lot of flatus, no BM yet.  Ambulating well.  Wants NG out and wants to eat.  KUB pending.  Objective: Vital signs in last 24 hours: Temp:  [97.8 F (36.6 C)-99.3 F (37.4 C)] 98.6 F (37 C) (06/09 0543) Pulse Rate:  [71-95] 71 (06/09 0543) Resp:  [16] 16 (06/09 0543) BP: (81-118)/(45-71) 108/60 mmHg (06/09 0543) SpO2:  [95 %-100 %] 99 % (06/09 0543) Last BM Date: 07/28/14  Intake/Output from previous day: 06/08 0701 - 06/09 0700 In: 3659 [P.O.:240; I.V.:3419] Out: 70 [Emesis/NG output:70] Intake/Output this shift:    PE: Gen:  Alert, NAD, pleasant Abd: Soft, NT/ND, +BS, no HSM, abdominal scars noted, NG output with clear sputum output only 66mL.   Lab Results:   Recent Labs  07/30/14 1832 07/31/14 1307  WBC 5.3 4.7  HGB 13.6 13.2  HCT 41.1 40.2  PLT 253 202   BMET  Recent Labs  07/30/14 1832 07/31/14 1307  NA 140 138  K 4.1 4.3  CL 104 106  CO2 24 24  GLUCOSE 86 70  BUN 11 10  CREATININE 0.91 0.78  CALCIUM 10.2 9.6   PT/INR No results for input(s): LABPROT, INR in the last 72 hours. CMP     Component Value Date/Time   NA 138 07/31/2014 1307   K 4.3 07/31/2014 1307   CL 106 07/31/2014 1307   CO2 24 07/31/2014 1307   GLUCOSE 70 07/31/2014 1307   BUN 10 07/31/2014 1307   CREATININE 0.78 07/31/2014 1307   CALCIUM 9.6 07/31/2014 1307   PROT 6.9 07/31/2014 1307   ALBUMIN 4.1 07/31/2014 1307   AST 22 07/31/2014 1307   ALT 14 07/31/2014 1307   ALKPHOS 67 07/31/2014 1307   BILITOT 0.7 07/31/2014 1307   GFRNONAA >60 07/31/2014 1307   GFRAA >60 07/31/2014 1307   Lipase     Component Value Date/Time   LIPASE 16* 07/31/2014 1307       Studies/Results: Ct Abdomen Pelvis Wo Contrast  07/31/2014   CLINICAL DATA:  Abdominal pain.  EXAM: CT ABDOMEN AND PELVIS WITHOUT CONTRAST  TECHNIQUE:  Multidetector CT imaging of the abdomen and pelvis was performed following the standard protocol without IV contrast.  COMPARISON:  03/22/2014  FINDINGS: There is marked dilatation of an abnormal small bowel loop in anterior abdomen. The degree of dilatation is greater than on 03/22/2014 over 11 cm. The small bowel distal to the abnormal segment is decompressed. The colon is normal in caliber. There is no extraluminal air. There is no ascites. There are grossly unremarkable unenhanced appearances of the liver, spleen, pancreas, adrenals and kidneys.  The abdominal aorta is normal in caliber with minimal atherosclerotic calcification.  There is no significant abnormality in the lower chest. There is no significant musculoskeletal abnormality.  IMPRESSION: Marked dilatation of an abnormal segment of small bowel. Oral contrast did not reach the colon, and the small bowel distal to the abnormal segment is decompressed, consistent with small bowel obstruction. The degree of small bowel dilatation is greater than on 03/22/2014.   Electronically Signed   By: Ellery Plunk M.D.   On: 07/31/2014 02:30    Anti-infectives: Anti-infectives    None       Assessment/Plan SBO -She has a very small NG tube with minimal output -Was planning on ordering SB protocol, but patient  seems to have made improvement today.  Will get KUB to check tube placement and status of bowel obstruction.  If KUB confirms what she is looking like clinically may be able to d/c NG and advance diet. 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

## 2014-08-02 ENCOUNTER — Inpatient Hospital Stay (HOSPITAL_COMMUNITY): Payer: Medicare HMO

## 2014-08-02 LAB — BASIC METABOLIC PANEL
Anion gap: 10 (ref 5–15)
BUN: 13 mg/dL (ref 6–20)
CO2: 26 mmol/L (ref 22–32)
CREATININE: 0.85 mg/dL (ref 0.44–1.00)
Calcium: 9.3 mg/dL (ref 8.9–10.3)
Chloride: 102 mmol/L (ref 101–111)
GFR calc Af Amer: >60 mL/min (ref >60)
GFR calc non Af Amer: >60 mL/min (ref >60)
Glucose, Bld: 92 mg/dL (ref 65–99)
POTASSIUM: 4.4 mmol/L (ref 3.5–5.1)
Sodium: 138 mmol/L (ref 135–145)

## 2014-08-02 LAB — MRSA PCR SCREENING: MRSA BY PCR: NEGATIVE

## 2014-08-02 LAB — GLUCOSE, CAPILLARY
GLUCOSE-CAPILLARY: 105 mg/dL — AB (ref 65–99)
Glucose-Capillary: 117 mg/dL — ABNORMAL HIGH (ref 65–99)
Glucose-Capillary: 120 mg/dL — ABNORMAL HIGH (ref 65–99)
Glucose-Capillary: 79 mg/dL (ref 65–99)
Glucose-Capillary: 89 mg/dL (ref 65–99)

## 2014-08-02 LAB — MAGNESIUM: Magnesium: 2 mg/dL (ref 1.7–2.4)

## 2014-08-02 MED ORDER — BISACODYL 10 MG RE SUPP
10.0000 mg | Freq: Every morning | RECTAL | Status: DC
  Administered 2014-08-03: 10 mg via RECTAL
  Filled 2014-08-02: qty 1

## 2014-08-02 MED ORDER — BISACODYL 10 MG RE SUPP
10.0000 mg | Freq: Once | RECTAL | Status: AC
  Administered 2014-08-02: 10 mg via RECTAL
  Filled 2014-08-02: qty 1

## 2014-08-02 MED ORDER — POLYETHYLENE GLYCOL 3350 17 GM/SCOOP PO POWD
1.0000 | Freq: Once | ORAL | Status: AC
  Administered 2014-08-02: 255 g via ORAL
  Filled 2014-08-02 (×2): qty 255

## 2014-08-02 NOTE — Progress Notes (Signed)
[facility] PROGRESS NOTE  [name] HWT:[id number] DOB: Sep 25, 1960 DOA: 07/30/2014 PCP: Diamantina Providence., FNP  Assessment/Plan: Principal Problem:   Abdominal pain Active Problems:   COPD (chronic obstructive pulmonary disease)   Hypertension   Hyperlipidemia   Abdominal pain, lower    1. Abdominal pain with nausea and diarrhea - CT scan shows features concerning for partial small bowel obstruction. KUB continues to show improvement,, NG tube discontinued,. Surgery following an diet to be advanced based on surgery recommendations, continue by mouth medications.  . Trial of Dulcolax suppository?start the patient on Mira lax,plan is to continue with current medical management but not completely advance diet as the patient still appears to be distended 2. COPD - presently not wheezing. Continue inhalers. 3. Hypertension - discontinue Norvasc as her blood pressure is soft 4. Hyperlipidemia - on statins.  Code Status: full Family Communication: family updated about patient's clinical progress Disposition Plan:  As above    Brief narrative: 54 y.o. female with a history of COPD, hypertension, hyperlipidemia who was admitted last February for partial small bowel obstruction presents to the ER because of abdominal pain with diarrhea over the last 4-5 days. Patient denies any recent use of antibiotics, travel or sick contacts. Patient had some nausea but denies vomiting. Pain is mostly in the upper part of the abdomen. Pain is constant and nonradiating. In the ER CT abdomen and pelvis shows possible partial small bowel obstruction and on-call surgeon Dr. Dwain Sarna has been consulted. Patient is admitted for further management. Patient denies any chest pain or shortness of breath. Denies any blood in the diarrhea.   Consultants:  General surgery   Procedures-None  Antibiotics: Anti-infectives    None       HPI/Subjective: No emesis. Transient nausea yesterday but  gone. States she has chronic nausea and takes phenergan regularly at home   Objective: Filed Vitals:   08/01/14 2036 08/01/14 2219 08/01/14 2225 08/02/14 0624  BP:   137/65 132/64  Pulse:   96 78  Temp:   98.7 F (37.1 C) 99.3 F (37.4 C)  TempSrc:  Oral Oral Oral  Resp:   16 16  Height:      Weight:      SpO2: 97%  98% 98%    Intake/Output Summary (Last 24 hours) at 08/02/14 1335 Last data filed at 08/02/14 0900  Gross per 24 hour  Intake   1600 ml  Output      0 ml  Net   1600 ml    Exam:  General: No acute respiratory distress Lungs: Clear to auscultation bilaterally without wheezes or crackles Cardiovascular: Regular rate and rhythm without murmur gallop or rub normal S1 and S2 Abdomen: Nontender,  Mild distended, soft, bowel sounds positive, no rebound, no ascites, no appreciable mass Extremities: No significant cyanosis, clubbing, or edema bilateral lower extremities     Data Review   Micro Results Recent Results (from the past 240 hour(s))  MRSA PCR Screening     Status: None   Collection Time: 08/01/14 11:39 PM  Result Value Ref Range Status   MRSA by PCR NEGATIVE NEGATIVE Final    Comment:        The GeneXpert MRSA Assay (FDA approved for NASAL specimens only), is one component of a comprehensive MRSA colonization surveillance program. It is not intended to diagnose MRSA infection nor to guide or monitor treatment for MRSA infections.     Radiology Reports Ct Abdomen Pelvis Wo  Contrast  07/31/2014   CLINICAL DATA:  Abdominal pain.  EXAM: CT ABDOMEN AND PELVIS WITHOUT CONTRAST  TECHNIQUE: Multidetector CT imaging of the abdomen and pelvis was performed following the standard protocol without IV contrast.  COMPARISON:  03/22/2014  FINDINGS: There is marked dilatation of an abnormal small bowel loop in anterior abdomen. The degree of dilatation is greater than on 03/22/2014 over 11 cm. The small bowel distal to the abnormal segment is decompressed. The  colon is normal in caliber. There is no extraluminal air. There is no ascites. There are grossly unremarkable unenhanced appearances of the liver, spleen, pancreas, adrenals and kidneys.  The abdominal aorta is normal in caliber with minimal atherosclerotic calcification.  There is no significant abnormality in the lower chest. There is no significant musculoskeletal abnormality.  IMPRESSION: Marked dilatation of an abnormal segment of small bowel. Oral contrast did not reach the colon, and the small bowel distal to the abnormal segment is decompressed, consistent with small bowel obstruction. The degree of small bowel dilatation is greater than on 03/22/2014.   Electronically Signed   By: Ellery Plunk M.D.   On: 07/31/2014 02:30   Dg Abd Portable 1v  08/02/2014   CLINICAL DATA:  Small bowel obstruction  EXAM: PORTABLE ABDOMEN - 1 VIEW  COMPARISON:  08/01/2014  FINDINGS: There is contrast throughout the colon. Mildly distended small bowel loops have improved. No obvious free intraperitoneal gas.  IMPRESSION: Improving partial small bowel obstruction pattern.   Electronically Signed   By: Jolaine Click M.D.   On: 08/02/2014 10:31   Dg Abd Portable 1v  08/01/2014   CLINICAL DATA:  Small bowel obstruction  EXAM: PORTABLE ABDOMEN - 1 VIEW  COMPARISON:  07/31/2014 CT scan  FINDINGS: Dilated loop of small bowel again appreciated. Oral contrast has reached the splenic flexure of the colon. The large bowel is nondilated and compared to the dilated small bowel is relatively decompressed.  IMPRESSION: Oral contrast has reached the colon. Abnormal small bowel dilatation per cysts.   Electronically Signed   By: Esperanza Heir M.D.   On: 08/01/2014 12:42     CBC  Recent Labs Lab 07/30/14 1832 07/31/14 1307  WBC 5.3 4.7  HGB 13.6 13.2  HCT 41.1 40.2  PLT 253 202  MCV 87.4 89.3  MCH 28.9 29.3  MCHC 33.1 32.8  RDW 14.5 14.6  LYMPHSABS 2.1 1.8  MONOABS 0.4 0.3  EOSABS 0.1 0.1  BASOSABS 0.0 0.0     Chemistries   Recent Labs Lab 07/30/14 1832 07/31/14 1307 08/02/14 0830  NA 140 138 138  K 4.1 4.3 4.4  CL 104 106 102  CO2 GLUCOSE 86 70 92  BUN CREATININE 0.91 0.78 0.85  CALCIUM 10.2 9.6 9.3  MG  --   --  2.0  AST 23 22  --   ALT 14 14  --   ALKPHOS 68 67  --   BILITOT 0.5 0.7  --    ------------------------------------------------------------------------------------------------------------------ estimated creatinine clearance is 79.1 mL/min (by C-G formula based on Cr of 0.85). ------------------------------------------------------------------------------------------------------------------ No results for input(s): HGBA1C in the last 72 hours. ------------------------------------------------------------------------------------------------------------------ No results for input(s): CHOL, HDL, LDLCALC, TRIG, CHOLHDL, LDLDIRECT in the last 72 hours. ------------------------------------------------------------------------------------------------------------------ No results for input(s): TSH, T4TOTAL, T3FREE, THYROIDAB in the last 72 hours.  Invalid input(s): FREET3 ------------------------------------------------------------------------------------------------------------------ No results for input(s): VITAMINB12, FOLATE, FERRITIN, TIBC, IRON, RETICCTPCT in the last 72 hours.  Coagulation profile No results for input(s):  INR, PROTIME in the last 168 hours.  No results for input(s): DDIMER in the last 72 hours.  Cardiac Enzymes No results for input(s): CKMB, TROPONINI, MYOGLOBIN in the last 168 hours.  Invalid input(s): CK ------------------------------------------------------------------------------------------------------------------ Invalid input(s): POCBNP   CBG:  Recent Labs Lab 08/01/14 0542 08/01/14 1145 08/01/14 1727 08/02/14 0010 08/02/14 0641  GLUCAP 81 93 122* 105* 79      Recent Results (from the past 240 hour(s))   MRSA PCR Screening     Status: None   Collection Time: 08/01/14 11:39 PM  Result Value Ref Range Status   MRSA by PCR NEGATIVE NEGATIVE Final    Comment:        The GeneXpert MRSA Assay (FDA approved for NASAL specimens only), is one component of a comprehensive MRSA colonization surveillance program. It is not intended to diagnose MRSA infection nor to guide or monitor treatment for MRSA infections.        Studies: Dg Abd Portable 1v  08/02/2014   CLINICAL DATA:  Small bowel obstruction  EXAM: PORTABLE ABDOMEN - 1 VIEW  COMPARISON:  08/01/2014  FINDINGS: There is contrast throughout the colon. Mildly distended small bowel loops have improved. No obvious free intraperitoneal gas.  IMPRESSION: Improving partial small bowel obstruction pattern.   Electronically Signed   By: Jolaine Click M.D.   On: 08/02/2014 10:31   Dg Abd Portable 1v  08/01/2014   CLINICAL DATA:  Small bowel obstruction  EXAM: PORTABLE ABDOMEN - 1 VIEW  COMPARISON:  07/31/2014 CT scan  FINDINGS: Dilated loop of small bowel again appreciated. Oral contrast has reached the splenic flexure of the colon. The large bowel is nondilated and compared to the dilated small bowel is relatively decompressed.  IMPRESSION: Oral contrast has reached the colon. Abnormal small bowel dilatation per cysts.   Electronically Signed   By: Esperanza Heir M.D.   On: 08/01/2014 12:42      No results found for: HGBA1C Lab Results  Component Value Date   CREATININE 0.85 08/02/2014       Scheduled Meds: . antiseptic oral rinse  7 mL Mouth Rinse q12n4p  . bisacodyl  10 mg Rectal q morning - 10a  . chlorhexidine  15 mL Mouth Rinse BID  . diclofenac sodium  2 g Topical QID  . enoxaparin (LOVENOX) injection  40 mg Subcutaneous Q24H  . ipratropium  2 spray Nasal QID  . lisinopril  20 mg Oral Daily  . mometasone-formoterol  2 puff Inhalation BID  . ondansetron (ZOFRAN) IV  4 mg Intravenous 4 times per day  . pantoprazole  40 mg Oral  Daily  . polyethylene glycol powder  1 Container Oral Once  . rosuvastatin  10 mg Oral Daily  . sertraline  100 mg Oral Daily  . tiotropium  18 mcg Inhalation Daily   Continuous Infusions:    Principal Problem:   Abdominal pain Active Problems:   COPD (chronic obstructive pulmonary disease)   Hypertension   Hyperlipidemia   Abdominal pain, lower    Time spent: 45 minutes   [facility]  [facility] Pager [phone]. If 7PM-7AM, please contact night-coverage at [email or web address], password [facility] 08/02/2014, 1:35 PM  LOS: 2 days

## 2014-08-02 NOTE — Progress Notes (Signed)
[facility] Progress Note     Subjective: Pt doing okay wants to eat some normal food.  Tolerated clears well.  Mild nausea (which she has at baseline), no vomiting, no abdominal pain, mild bloating.  No belching.  Passing good flatus, no BM yet.    Objective: Vital signs in last 24 hours: Temp:  [98.6 F (37 C)-99.3 F (37.4 C)] 99.3 F (37.4 C) (06/10 0624) Pulse Rate:  [78-96] 78 (06/10 0624) Resp:  [16] 16 (06/10 0624) BP: (91-137)/(52-65) 132/64 mmHg (06/10 0624) SpO2:  [97 %-100 %] 98 % (06/10 0624) Last BM Date: 07/28/14  Intake/Output from previous day: 06/09 0701 - 06/10 0700 In: 1080 [P.O.:1080] Out: -  Intake/Output this shift:    PE: Gen:  Alert, NAD, pleasant Abd: Obese, soft, mild distension, NT, +BS, no HSM, abdominal scars noted   Lab Results:   Recent Labs  07/30/14 1832 07/31/14 1307  WBC 5.3 4.7  HGB 13.6 13.2  HCT 41.1 40.2  PLT 253 202   BMET  Recent Labs  07/30/14 1832 07/31/14 1307  NA 140 138  K 4.1 4.3  CL 104 106  CO2 24 24  GLUCOSE 86 70  BUN 11 10  CREATININE 0.91 0.78  CALCIUM 10.2 9.6   PT/INR No results for input(s): LABPROT, INR in the last 72 hours. CMP     Component Value Date/Time   NA 138 07/31/2014 1307   K 4.3 07/31/2014 1307   CL 106 07/31/2014 1307   CO2 24 07/31/2014 1307   GLUCOSE 70 07/31/2014 1307   BUN 10 07/31/2014 1307   CREATININE 0.78 07/31/2014 1307   CALCIUM 9.6 07/31/2014 1307   PROT 6.9 07/31/2014 1307   ALBUMIN 4.1 07/31/2014 1307   AST 22 07/31/2014 1307   ALT 14 07/31/2014 1307   ALKPHOS 67 07/31/2014 1307   BILITOT 0.7 07/31/2014 1307   GFRNONAA >60 07/31/2014 1307   GFRAA >60 07/31/2014 1307   Lipase     Component Value Date/Time   LIPASE 16* 07/31/2014 1307       Studies/Results: Dg Abd Portable 1v  08/01/2014   CLINICAL DATA:  Small bowel obstruction  EXAM: PORTABLE ABDOMEN - 1 VIEW  COMPARISON:  07/31/2014 CT scan  FINDINGS: Dilated loop of small bowel  again appreciated. Oral contrast has reached the splenic flexure of the colon. The large bowel is nondilated and compared to the dilated small bowel is relatively decompressed.  IMPRESSION: Oral contrast has reached the colon. Abnormal small bowel dilatation per cysts.   Electronically Signed   By: Esperanza Heir M.D.   On: 08/01/2014 12:42    Anti-infectives: Anti-infectives    None       Assessment/Plan SBO -KUB yesterday showed contrast in colon, and had 70mL clear output so removed NG yesterday and started clears which she is tolerating.  She feels a little bloated, no pain.   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

## 2014-08-03 LAB — GLUCOSE, CAPILLARY
GLUCOSE-CAPILLARY: 130 mg/dL — AB (ref 65–99)
GLUCOSE-CAPILLARY: 95 mg/dL (ref 65–99)

## 2014-08-03 MED ORDER — BISACODYL 10 MG RE SUPP: 10.0000 mg | Freq: Every day | RECTAL | Status: DC | PRN

## 2014-08-03 NOTE — Discharge Summary (Addendum)
Physician Discharge Summary  TANIAYA RUDDER MRN: [id number] DOB/AGE: December 02, 1960 54 y.o.  PCP: Vonna Drafts., FNP   Admit date: 07/30/2014 Discharge date: 08/03/2014  Discharge Diagnoses:     Principal Problem:   Abdominal pain Active Problems:   COPD (chronic obstructive pulmonary disease)   Hypertension   Hyperlipidemia   Abdominal pain, lower    Follow-up recommendations Follow-up with PCP in 3-5 days , including although additional recommended appointments as below Follow-up CBC, CMP in 3-5 days      Medication List    STOP taking these medications        naproxen 500 MG tablet  Commonly known as:  NAPROSYN      TAKE these medications        albuterol 108 (90 BASE) MCG/ACT inhaler  Commonly known as:  PROVENTIL HFA;VENTOLIN HFA  Inhale 1 puff into the lungs every 6 (six) hours as needed for wheezing or shortness of breath.     amLODipine 10 MG tablet  Commonly known as:  NORVASC  Take 10 mg by mouth daily.     bisacodyl 10 MG suppository  Commonly known as:  DULCOLAX  Place 1 suppository (10 mg total) rectally daily as needed for moderate constipation.     diclofenac sodium 1 % Gel  Commonly known as:  VOLTAREN  Apply topically 4 (four) times daily.     diphenhydrAMINE 25 MG tablet  Commonly known as:  SOMINEX  Take 25 mg by mouth at bedtime as needed for itching or sleep.     esomeprazole 40 MG capsule  Commonly known as:  NEXIUM  Take 40 mg by mouth daily at 12 noon.     Fluticasone-Salmeterol 500-50 MCG/DOSE Aepb  Commonly known as:  ADVAIR  Inhale 1 puff into the lungs 2 (two) times daily.     ipratropium 0.06 % nasal spray  Commonly known as:  ATROVENT  Place 2 sprays into the nose 4 (four) times daily.     lisinopril 20 MG tablet  Commonly known as:  PRINIVIL,ZESTRIL  Take 20 mg by mouth daily.     loratadine 10 MG tablet  Commonly known as:  CLARITIN  Take 10 mg by mouth daily.     LORazepam 0.5 MG tablet  Commonly known as:   ATIVAN  Take 0.5 mg by mouth every 6 (six) hours as needed for anxiety.     multivitamin with minerals Tabs tablet  Take 1 tablet by mouth daily.     polyethylene glycol packet  Commonly known as:  MIRALAX / GLYCOLAX  Take 17 g by mouth daily as needed for moderate constipation.     promethazine 25 MG tablet  Commonly known as:  PHENERGAN  Take 25 mg by mouth every 8 (eight) hours as needed for nausea or vomiting.     rosuvastatin 10 MG tablet  Commonly known as:  CRESTOR  Take 10 mg by mouth daily.     sertraline 100 MG tablet  Commonly known as:  ZOLOFT  Take 100 mg by mouth daily.     SYSTANE BALANCE OP  Place 1 drop into the right eye daily. Both eyes     tiotropium 18 MCG inhalation capsule  Commonly known as:  SPIRIVA  Place 18 mcg into inhaler and inhale daily.     traMADol 50 MG tablet  Commonly known as:  ULTRAM  Take 1 tablet (50 mg total) by mouth every 6 (six) hours as needed for moderate pain.  traMADol 50 MG tablet  Commonly known as:  ULTRAM  Take 1 tablet (50 mg total) by mouth every 6 (six) hours as needed.     traZODone 100 MG tablet  Commonly known as:  DESYREL  Take 100 mg by mouth at bedtime as needed.     zolpidem 10 MG tablet  Commonly known as:  AMBIEN  Take 10 mg by mouth at bedtime as needed for sleep.         Discharge Condition: Stable  Disposition: 01-Home or Self Care   Consults:  General surgery   Significant Diagnostic Studies:  Ct Abdomen Pelvis Wo Contrast  07/31/2014   CLINICAL DATA:  Abdominal pain.  EXAM: CT ABDOMEN AND PELVIS WITHOUT CONTRAST  TECHNIQUE: Multidetector CT imaging of the abdomen and pelvis was performed following the standard protocol without IV contrast.  COMPARISON:  03/22/2014  FINDINGS: There is marked dilatation of an abnormal small bowel loop in anterior abdomen. The degree of dilatation is greater than on 03/22/2014 over 11 cm. The small bowel distal to the abnormal segment is decompressed. The  colon is normal in caliber. There is no extraluminal air. There is no ascites. There are grossly unremarkable unenhanced appearances of the liver, spleen, pancreas, adrenals and kidneys.  The abdominal aorta is normal in caliber with minimal atherosclerotic calcification.  There is no significant abnormality in the lower chest. There is no significant musculoskeletal abnormality.  IMPRESSION: Marked dilatation of an abnormal segment of small bowel. Oral contrast did not reach the colon, and the small bowel distal to the abnormal segment is decompressed, consistent with small bowel obstruction. The degree of small bowel dilatation is greater than on 03/22/2014.   Electronically Signed   By: Andreas Newport M.D.   On: 07/31/2014 02:30   Dg Abd Portable 1v  08/02/2014   CLINICAL DATA:  Small bowel obstruction  EXAM: PORTABLE ABDOMEN - 1 VIEW  COMPARISON:  08/01/2014  FINDINGS: There is contrast throughout the colon. Mildly distended small bowel loops have improved. No obvious free intraperitoneal gas.  IMPRESSION: Improving partial small bowel obstruction pattern.   Electronically Signed   By: Marybelle Killings M.D.   On: 08/02/2014 10:31   Dg Abd Portable 1v  08/01/2014   CLINICAL DATA:  Small bowel obstruction  EXAM: PORTABLE ABDOMEN - 1 VIEW  COMPARISON:  07/31/2014 CT scan  FINDINGS: Dilated loop of small bowel again appreciated. Oral contrast has reached the splenic flexure of the colon. The large bowel is nondilated and compared to the dilated small bowel is relatively decompressed.  IMPRESSION: Oral contrast has reached the colon. Abnormal small bowel dilatation per cysts.   Electronically Signed   By: Skipper Cliche M.D.   On: 08/01/2014 12:42       Filed Weights   07/30/14 1818  Weight: 88.451 kg (195 lb)     Microbiology: Recent Results (from the past 240 hour(s))  MRSA PCR Screening     Status: None   Collection Time: 08/01/14 11:39 PM  Result Value Ref Range Status   MRSA by PCR NEGATIVE  NEGATIVE Final    Comment:        The GeneXpert MRSA Assay (FDA approved for NASAL specimens only), is one component of a comprehensive MRSA colonization surveillance program. It is not intended to diagnose MRSA infection nor to guide or monitor treatment for MRSA infections.        Blood Culture    Component Value Date/Time   SDES URINE, CLEAN CATCH  08/13/2007 1837   [place] 08/13/2007 1837   CULT INSIGNIFICANT GROWTH 08/13/2007 1837   REPTSTATUS 08/15/2007 FINAL 08/13/2007 1837      Labs: Results for orders placed or performed during the hospital encounter of 07/30/14 (from the past 48 hour(s))  Glucose, capillary     Status: None   Collection Time: 08/01/14 11:45 AM  Result Value Ref Range   Glucose-Capillary 93 65 - 99 mg/dL  Glucose, capillary     Status: Abnormal   Collection Time: 08/01/14  5:27 PM  Result Value Ref Range   Glucose-Capillary 122 (H) 65 - 99 mg/dL  MRSA PCR Screening     Status: None   Collection Time: 08/01/14 11:39 PM  Result Value Ref Range   MRSA by PCR NEGATIVE NEGATIVE    Comment:        The GeneXpert MRSA Assay (FDA approved for NASAL specimens only), is one component of a comprehensive MRSA colonization surveillance program. It is not intended to diagnose MRSA infection nor to guide or monitor treatment for MRSA infections.   Glucose, capillary     Status: Abnormal   Collection Time: 08/02/14 12:10 AM  Result Value Ref Range   Glucose-Capillary 105 (H) 65 - 99 mg/dL  Glucose, capillary     Status: None   Collection Time: 08/02/14  6:41 AM  Result Value Ref Range   Glucose-Capillary 79 65 - 99 mg/dL  Basic metabolic panel     Status: None   Collection Time: 08/02/14  8:30 AM  Result Value Ref Range   Sodium 138 135 - 145 mmol/L   Potassium 4.4 3.5 - 5.1 mmol/L   Chloride 102 101 - 111 mmol/L   CO2 26 22 - 32 mmol/L   Glucose, Bld 92 65 - 99 mg/dL   BUN 13 6 - 20 mg/dL   Creatinine, Ser 0.85 0.44 - 1.00 mg/dL    Calcium 9.3 8.9 - 10.3 mg/dL   GFR calc non Af Amer >60 >60 mL/min   GFR calc Af Amer >60 >60 mL/min    Comment: (NOTE) The eGFR has been calculated using the CKD EPI equation. This calculation has not been validated in all clinical situations. eGFR's persistently <60 mL/min signify possible Chronic Kidney Disease.    Anion gap 10 5 - 15  Magnesium     Status: None   Collection Time: 08/02/14  8:30 AM  Result Value Ref Range   Magnesium 2.0 1.7 - 2.4 mg/dL  Glucose, capillary     Status: Abnormal   Collection Time: 08/02/14  1:24 PM  Result Value Ref Range   Glucose-Capillary 120 (H) 65 - 99 mg/dL  Glucose, capillary     Status: Abnormal   Collection Time: 08/02/14  6:51 PM  Result Value Ref Range   Glucose-Capillary 117 (H) 65 - 99 mg/dL  Glucose, capillary     Status: None   Collection Time: 08/02/14 11:34 PM  Result Value Ref Range   Glucose-Capillary 89 65 - 99 mg/dL   Comment 1 Notify RN   Glucose, capillary     Status: Abnormal   Collection Time: 08/03/14  5:43 AM  Result Value Ref Range   Glucose-Capillary 130 (H) 65 - 99 mg/dL   Comment 1 Notify RN      Lipid Panel  No results found for: CHOL, TRIG, HDL, CHOLHDL, VLDL, LDLCALC, LDLDIRECT   No results found for: HGBA1C   Lab Results  Component Value Date   CREATININE 0.85 08/02/2014  HPI :[name] is a 54 y.o. female with a history of COPD, hypertension, hyperlipidemia who was admitted last February for partial small bowel obstruction presents to the ER because of abdominal pain with diarrhea over the last 4-5 days. Patient denies any recent use of antibiotics, travel or sick contacts. Patient had some nausea but denies vomiting. Pain is mostly in the upper part of the abdomen. Pain is constant and nonradiating. In the ER CT abdomen and pelvis shows possible partial small bowel obstruction and on-call surgeon Dr. Donne Hazel has been consulted. Patient is admitted for further management. Patient denies  any chest pain or shortness of breath. Denies any blood in the diarrhea.   HOSPITAL COURSE:   1. Abdominal pain with nausea and diarrhea - CT scan shows features concerning for partial small bowel obstruction. KUB continues to show improvement,, NG tube discontinued,. Surgery following an diet to be advanced based on surgery recommendations, symptoms improved with Dulcolax suppository, continuation of miralax, patient is tolerating soft diet, general surgery recommends that the patient eat small frequent meals and continue aggressive constipation regimen at home . 2. COPD - presently not wheezing. Continue inhalers. 3. Hypertension - discontinue Norvasc as her blood pressure is soft 4. Hyperlipidemia - on statins.    Discharge Exam   Blood pressure 101/46, pulse 81, temperature 98.4 F (36.9 C), temperature source Oral, resp. rate 18, height $RemoveBe'5\' 2"'LxLqxYxsz$  (1.575 m), weight 88.451 kg (195 lb), SpO2 99 %.  General: No acute respiratory distress Lungs: Clear to auscultation bilaterally without wheezes or crackles Cardiovascular: Regular rate and rhythm without murmur gallop or rub normal S1 and S2 Abdomen: Nontender, Mild distended, soft, bowel sounds positive, no rebound, no ascites, no appreciable mass Extremities: No significant cyanosis, clubbing, or edema bilateral lower extremities          Follow-up Information    Please follow up.   Why:  See your family doctor for recheck within one to 2 days.      Follow up with Vonna Drafts., FNP. Schedule an appointment as soon as possible for a visit in 3 days.   Specialty:  Nurse Practitioner   Contact information:   [address] [place] [address] [phone]       Signed: Reyne Dumas 08/03/2014, 11:00 AM        Time spent >45 mins

## 2014-08-03 NOTE — Progress Notes (Signed)
  Subjective: Had some nausea (which is chronic for her); no emesis; liquids went well. Had BM at 0430 without assistance per pt. Still with lots of flatus Objective: Vital signs in last 24 hours: Temp:  [98.1 F (36.7 C)-98.5 F (36.9 C)] 98.4 F (36.9 C) (06/11 0514) Pulse Rate:  [81-92] 81 (06/11 0514) Resp:  [16-18] 18 (06/11 0514) BP: (89-101)/(44-56) 101/46 mmHg (06/11 0514) SpO2:  [96 %-99 %] 99 % (06/11 0514) Last BM Date: 08/01/14  Intake/Output from previous day: 06/10 0701 - 06/11 0700 In: 1940 [P.O.:1940] Out: -  Intake/Output this shift:    Asleep, easily awakens Nontoxic Soft, completely nontender; some protuberance (appears to be baseline)  Lab Results:   Recent Labs  07/31/14 1307  WBC 4.7  HGB 13.2  HCT 40.2  PLT 202   BMET  Recent Labs  07/31/14 1307 08/02/14 0830  NA 138 138  K 4.3 4.4  CL 106 102  CO2 24 26  GLUCOSE 70 92  BUN 10 13  CREATININE 0.78 0.85  CALCIUM 9.6 9.3   PT/INR No results for input(s): LABPROT, INR in the last 72 hours. ABG No results for input(s): PHART, HCO3 in the last 72 hours.  Invalid input(s): PCO2, PO2  Studies/Results: Dg Abd Portable 1v  08/02/2014   CLINICAL DATA:  Small bowel obstruction  EXAM: PORTABLE ABDOMEN - 1 VIEW  COMPARISON:  08/01/2014  FINDINGS: There is contrast throughout the colon. Mildly distended small bowel loops have improved. No obvious free intraperitoneal gas.  IMPRESSION: Improving partial small bowel obstruction pattern.   Electronically Signed   By: Jolaine Click M.D.   On: 08/02/2014 10:31   Dg Abd Portable 1v  08/01/2014   CLINICAL DATA:  Small bowel obstruction  EXAM: PORTABLE ABDOMEN - 1 VIEW  COMPARISON:  07/31/2014 CT scan  FINDINGS: Dilated loop of small bowel again appreciated. Oral contrast has reached the splenic flexure of the colon. The large bowel is nondilated and compared to the dilated small bowel is relatively decompressed.  IMPRESSION: Oral contrast has reached the  colon. Abnormal small bowel dilatation per cysts.   Electronically Signed   By: Esperanza Heir M.D.   On: 08/01/2014 12:42    Anti-infectives: Anti-infectives    None      Assessment/Plan: pSBO  Appears to have resolved If tolerates breakfast, she can go home from our standpoint Discussed she would be probably better off, eating smaller meals for time being, avoiding big bulky foods  Amatullah Sella. Andrey Campanile, MD, FACS General, Bariatric, & Minimally Invasive Surgery [facility], [place]   LOS: 3 days    Atilano Ina 08/03/2014

## 2014-08-03 NOTE — Progress Notes (Signed)
Discharge home. Home discharge instruction given, no question verbalized. 

## 2014-08-21 ENCOUNTER — Encounter: Payer: Self-pay | Admitting: Gastroenterology

## 2014-08-21 ENCOUNTER — Ambulatory Visit (INDEPENDENT_AMBULATORY_CARE_PROVIDER_SITE_OTHER): Payer: Medicare HMO | Admitting: Gastroenterology

## 2014-08-21 ENCOUNTER — Emergency Department (HOSPITAL_COMMUNITY)
Admission: EM | Admit: 2014-08-21 | Discharge: 2014-08-21 | Disposition: A | Discharge status: Home / Self Care | Payer: Medicare HMO | Attending: Emergency Medicine | Admitting: Emergency Medicine

## 2014-08-21 ENCOUNTER — Emergency Department (HOSPITAL_COMMUNITY): Payer: Medicare HMO

## 2014-08-21 ENCOUNTER — Encounter (HOSPITAL_COMMUNITY): Payer: Self-pay | Admitting: *Deleted

## 2014-08-21 VITALS — BP 128/84 | HR 84 | Ht 62.0 in | Wt 203.0 lb

## 2014-08-21 DIAGNOSIS — G8929 Other chronic pain: Secondary | ICD-10-CM | POA: Diagnosis not present

## 2014-08-21 DIAGNOSIS — G4700 Insomnia, unspecified: Secondary | ICD-10-CM | POA: Diagnosis not present

## 2014-08-21 DIAGNOSIS — Z79899 Other long term (current) drug therapy: Secondary | ICD-10-CM | POA: Insufficient documentation

## 2014-08-21 DIAGNOSIS — H269 Unspecified cataract: Secondary | ICD-10-CM | POA: Diagnosis not present

## 2014-08-21 DIAGNOSIS — E785 Hyperlipidemia, unspecified: Secondary | ICD-10-CM | POA: Insufficient documentation

## 2014-08-21 DIAGNOSIS — K219 Gastro-esophageal reflux disease without esophagitis: Secondary | ICD-10-CM | POA: Diagnosis not present

## 2014-08-21 DIAGNOSIS — R011 Cardiac murmur, unspecified: Secondary | ICD-10-CM | POA: Diagnosis not present

## 2014-08-21 DIAGNOSIS — J449 Chronic obstructive pulmonary disease, unspecified: Secondary | ICD-10-CM | POA: Insufficient documentation

## 2014-08-21 DIAGNOSIS — F329 Major depressive disorder, single episode, unspecified: Secondary | ICD-10-CM | POA: Diagnosis not present

## 2014-08-21 DIAGNOSIS — I10 Essential (primary) hypertension: Secondary | ICD-10-CM | POA: Diagnosis not present

## 2014-08-21 DIAGNOSIS — K5669 Other intestinal obstruction: Secondary | ICD-10-CM

## 2014-08-21 DIAGNOSIS — M25561 Pain in right knee: Secondary | ICD-10-CM | POA: Insufficient documentation

## 2014-08-21 DIAGNOSIS — Z91040 Latex allergy status: Secondary | ICD-10-CM | POA: Insufficient documentation

## 2014-08-21 DIAGNOSIS — M25569 Pain in unspecified knee: Secondary | ICD-10-CM

## 2014-08-21 DIAGNOSIS — J45909 Unspecified asthma, uncomplicated: Secondary | ICD-10-CM | POA: Diagnosis not present

## 2014-08-21 DIAGNOSIS — K56600 Partial intestinal obstruction, unspecified as to cause: Secondary | ICD-10-CM

## 2014-08-21 NOTE — ED Notes (Signed)
Patient transported to X-ray 

## 2014-08-21 NOTE — ED Notes (Signed)
patient came into ED today d/t pain in right knee worsening over a 1.5 weeks. Patient has hx of knee replacement right knee. She has used some OTC's to assist without resolve.

## 2014-08-21 NOTE — Discharge Instructions (Signed)

## 2014-08-21 NOTE — ED Notes (Signed)
Questions concerns r/t dc were denied. Pt ambulatory, knee sleeve applied. Pt a&ox4

## 2014-08-21 NOTE — ED Provider Notes (Signed)
CSN: [id number]     Arrival date & time 08/21/14  1551 History  This chart was scribed for non-physician practitioner, Roxy Horsemanobert Guynell Kleiber, PA-C working with Lorre NickAnthony Allen, MD by Placido SouLogan Joldersma, ED scribe. This patient was seen in room WTR8/WTR8 and the patient's care was started at 4:37 PM.    No chief complaint on file.  The history is provided by the patient. No language interpreter was used.    HPI Comments: [name] is a 54 y.o. female, with a history of osteoarthritis, who presents to the Emergency Department complaining of moderate, worsening, pain and swelling to her right knee with onset a few days ago. She notes making an odd movement with the affected leg when exiting a bus but denies any other traumatic injury. She states that she has pain when she walks, but is able to walk. The pain does not radiate.  Pt notes having her right knee replaced in 2014 by Dr. Alessandra [place]Vaughn [place]  Past Medical History  Diagnosis Date  . COPD (chronic obstructive pulmonary disease)   . Hypertension   . GERD (gastroesophageal reflux disease)   . Cataract   . Hyperlipidemia   . Heart murmur   . Asthma   . Chronic bronchitis   . Insomnia   . History of blood transfusion 2003-2004    "related to bowel obstruction OR"  . Osteoarthritis     "lower back; both hips; right leg" (08/01/2014)  . Chronic lower back pain   . Depression    Past Surgical History  Procedure Laterality Date  . Abdominal hysterectomy  2003  . Bowel resection  2003; 2004    x2 with primary anastamosis  . Total knee arthroplasty Right 03/2012    [facility]  . Laparoscopic incisional / umbilical / ventral hernia repair  2012    [facility], Dr. Carolynn SayersWescott  . Abdominal exploration surgery  2004    abdominal compartment syndrome  . Abdominal exploration surgery  2003    small bowel prolapse through vagina  . Tonsillectomy  1977  . Adenoidectomy  2002  . Hernia repair    . Joint replacement    . Combined hysteroscopy diagnostic /  d&c  ~ 2003   Family History  Problem Relation Age of Onset  . Cancer Mother   . Heart attack Father   . Colon cancer Maternal Grandmother   . Colon cancer Paternal Grandmother    History  Substance Use Topics  . Smoking status: Never Smoker   . Smokeless tobacco: Never Used  . Alcohol Use: No   OB History    No data available     Review of Systems  Constitutional: Negative for fever and chills.  Respiratory: Negative for shortness of breath.   Cardiovascular: Negative for chest pain.  Gastrointestinal: Negative for nausea, vomiting, diarrhea and constipation.  Genitourinary: Negative for dysuria.  Musculoskeletal: Positive for arthralgias.      Allergies  Bee venom; Peanuts; Shellfish allergy; Aspirin; Corn-containing products; Ibuprofen; Latex; Morphine and related; and Zofran  Home Medications   Prior to Admission medications   Medication Sig Start Date End Date Taking? Authorizing Provider  albuterol (PROVENTIL HFA;VENTOLIN HFA) 108 (90 BASE) MCG/ACT inhaler Inhale 1 puff into the lungs every 6 (six) hours as needed for wheezing or shortness of breath.    Historical Provider, MD  amLODipine (NORVASC) 10 MG tablet Take 10 mg by mouth daily.    Historical Provider, MD  bisacodyl (DULCOLAX) 10 MG suppository Place 1 suppository (10  mg total) rectally daily as needed for moderate constipation. 08/03/14   [name], MD  diclofenac sodium (VOLTAREN) 1 % GEL Apply topically 4 (four) times daily.    Historical Provider, MD  diphenhydrAMINE (SOMINEX) 25 MG tablet Take 25 mg by mouth at bedtime as needed for itching or sleep.     Historical Provider, MD  esomeprazole (NEXIUM) 40 MG capsule Take 40 mg by mouth daily at 12 noon.    Historical Provider, MD  Fluticasone-Salmeterol (ADVAIR) 500-50 MCG/DOSE AEPB Inhale 1 puff into the lungs 2 (two) times daily.    Historical Provider, MD  ipratropium (ATROVENT) 0.06 % nasal spray Place 2 sprays into the nose 4 (four) times daily.  05/05/13   Linna Hoff, MD  lisinopril (PRINIVIL,ZESTRIL) 20 MG tablet Take 20 mg by mouth daily.    Historical Provider, MD  loratadine (CLARITIN) 10 MG tablet Take 10 mg by mouth daily.    Historical Provider, MD  LORazepam (ATIVAN) 0.5 MG tablet Take 0.5 mg by mouth every 6 (six) hours as needed for anxiety.    Historical Provider, MD  Multiple Vitamin (MULTIVITAMIN WITH MINERALS) TABS tablet Take 1 tablet by mouth daily.    Historical Provider, MD  polyethylene glycol (MIRALAX / GLYCOLAX) packet Take 17 g by mouth daily as needed for moderate constipation.     Historical Provider, MD  promethazine (PHENERGAN) 25 MG tablet Take 25 mg by mouth every 8 (eight) hours as needed for nausea or vomiting.  08/20/13   Historical Provider, MD  Propylene Glycol (SYSTANE BALANCE OP) Place 1 drop into the right eye daily. Both eyes    Historical Provider, MD  rosuvastatin (CRESTOR) 10 MG tablet Take 10 mg by mouth daily.    Historical Provider, MD  sertraline (ZOLOFT) 100 MG tablet Take 100 mg by mouth daily.    Historical Provider, MD  tiotropium (SPIRIVA) 18 MCG inhalation capsule Place 18 mcg into inhaler and inhale daily.    Historical Provider, MD  traMADol (ULTRAM) 50 MG tablet Take 1 tablet (50 mg total) by mouth every 6 (six) hours as needed for moderate pain. 12/28/13   Linna Hoff, MD  traMADol (ULTRAM) 50 MG tablet Take 1 tablet (50 mg total) by mouth every 6 (six) hours as needed. 07/31/14   Arby Barrette, MD  traZODone (DESYREL) 100 MG tablet Take 100 mg by mouth at bedtime as needed. 06/09/14   Historical Provider, MD  zolpidem (AMBIEN) 10 MG tablet Take 10 mg by mouth at bedtime as needed for sleep.    Historical Provider, MD   There were no vitals taken for this visit. Physical Exam  Constitutional: She is oriented to person, place, and time. She appears well-developed and well-nourished. No distress.  HENT:  Head: Normocephalic and atraumatic.  Mouth/Throat: Oropharynx is clear and moist.   Eyes: Conjunctivae and EOM are normal. Pupils are equal, round, and reactive to light.  Neck: Normal range of motion. Neck supple. No tracheal deviation present.  Cardiovascular: Normal rate.   Pulmonary/Chest: Effort normal and breath sounds normal. No respiratory distress.  Abdominal: Soft. She exhibits no distension.  Musculoskeletal: Normal range of motion.  Right knee mildly tender to palpation, no obvious effusion or swelling, no bony abnormality or deformity, range of motion strength 5/5, patient is able to ambulate, no crepitus, clicking, popping, or locking  Neurological: She is alert and oriented to person, place, and time.  Skin: Skin is warm and dry.  Psychiatric: She has a normal mood  and affect. Her behavior is normal.  Nursing note and vitals reviewed.   ED Course  Procedures  DIAGNOSTIC STUDIES: Oxygen Saturation is 99% on RA, normal by my interpretation.    COORDINATION OF CARE:   Labs Review Labs Reviewed - No data to display  Imaging Review Dg Knee Complete 4 Views Right  08/21/2014   CLINICAL DATA:  Trauma 2 weeks ago with worsening right pain and swelling. Initial encounter.  EXAM: RIGHT KNEE - COMPLETE 4+ VIEW  COMPARISON:  01/30/2014  FINDINGS: Right knee arthroplasty. No acute hardware complication. Resolution of suprapatellar joint effusion; degraded evaluation secondary to patient size. No acute fracture or dislocation.  IMPRESSION: No acute osseous abnormality.   Electronically Signed   By: Jeronimo Greaves M.D.   On: 08/21/2014 17:59     EKG Interpretation None      MDM   Final diagnoses:  Knee pain    Patient with right knee pain. History of total knee replacement. Will check plain films. If negative, plan for discharge with knee sleeve and OTC medications. Recommend primary care/orthopedic follow-up. Patient understands and agrees plan.  I personally performed the services described in this documentation, which was scribed in my presence. The  recorded information has been reviewed and is accurate.     Roxy Horseman, PA-C 08/21/14 1810  Lorre Nick, MD 08/22/14 2032

## 2014-08-21 NOTE — Patient Instructions (Signed)
Please follow up as needed 

## 2014-08-21 NOTE — Progress Notes (Signed)
     08/21/2014 Don PerkingMary G Kraner [id number] 29-Aug-1960   History of Present Illness:  This is a pleasant 54 year old female who is known to Dr. Arlyce DiceKaplan for recent colonoscopy in 04/2014 at which time the study was normal.  Repeat was recommended in 5 years from that time due to FH of colon cancer in her mother and some grandparents.  She has a history of hysterectomy in 2003, small bowel prolapse through vagina resulting in SBR, 2004 SBO with bowel ischemia and SBR x 2, another decompressive lap, h/o EC fistula, incisional hernia repair with mesh.  She was given this appointment today for complaints of abdominal pain, however, ended up in the hospital for a short stay from 6/7 to 6/11 for PSBO at which time she was followed by the surgeons.  CT scan without contrast showed the following:  IMPRESSION: Marked dilatation of an abnormal segment of small bowel. Oral contrast did not reach the colon, and the small bowel distal to the abnormal segment is decompressed, consistent with small bowel obstruction. The degree of small bowel dilatation is greater than on 03/22/2014.  This resolved with conservative measures.  Was having vomiting, abdominal pain, and abdominal bloating.  All of that has resolved and has not been an issue since her hospital discharge.  She does have chronic nausea, which is managed by phenergan.  She was told to keep this appointment today for follow-up, but has been feeling well.    Does have some constipation for which she uses Miralax and then will have an occasional episode of diarrhea.  Has been eating well with good appetite recently.   Current Medications, Allergies, Past Medical History, Past Surgical History, Family History and Social History were reviewed in [facility] electronic medical record.   Physical Exam: BP 128/84 mmHg  Pulse 84  Ht 5\' 2"  (1.575 m)  Wt 203 lb (92.08 kg)  BMI 37.12 kg/m2  SpO2 99% General: Well developed black female in no acute  distress Head: Normocephalic and atraumatic Eyes:  Sclerae anicteric, conjunctiva pink  Ears: Normal auditory acuity Lungs: Clear throughout to auscultation Heart: Regular rate and rhythm Abdomen: Soft, non-distended.  Multiple scars noted on abdomen.  Normal bowel sounds.  Non-tender. Musculoskeletal: Symmetrical with no gross deformities  Extremities: No edema  Neurological: Alert oriented x 4, grossly non-focal Psychological:  Alert and cooperative. Normal mood and affect  Assessment and Recommendations: -PSBO, mechanical likely due to adhesions from multiple abdominal surgeries:  Abdominal pain, bloating, vomiting now resolved after brief hospitalization.  Has chronic nausea.  Alternates between constipation and some episodes of diarrhea.  Advised her that if she has recurrence of similar symptoms then she will need to return to the hospital.  Should try to avoid eating too many foods that are high in fiber such as salads, etc and we discussed this.  Will follow-up here prn for now.

## 2014-08-22 NOTE — Progress Notes (Signed)
Reviewed and agree with management. Diva Lemberger D. Siriah Treat, M.D., FACG  

## 2014-09-30 ENCOUNTER — Other Ambulatory Visit: Payer: Self-pay | Admitting: Nurse Practitioner

## 2014-09-30 DIAGNOSIS — N6489 Other specified disorders of breast: Secondary | ICD-10-CM

## 2014-09-30 DIAGNOSIS — R921 Mammographic calcification found on diagnostic imaging of breast: Secondary | ICD-10-CM

## 2014-12-03 ENCOUNTER — Emergency Department (HOSPITAL_COMMUNITY)
Admission: EM | Admit: 2014-12-03 | Discharge: 2014-12-04 | Disposition: A | Discharge status: Home / Self Care | Payer: Medicare HMO | Attending: Emergency Medicine | Admitting: Emergency Medicine

## 2014-12-03 ENCOUNTER — Emergency Department (HOSPITAL_COMMUNITY): Payer: Medicare HMO

## 2014-12-03 ENCOUNTER — Encounter (HOSPITAL_COMMUNITY): Payer: Self-pay | Admitting: Emergency Medicine

## 2014-12-03 DIAGNOSIS — J449 Chronic obstructive pulmonary disease, unspecified: Secondary | ICD-10-CM | POA: Insufficient documentation

## 2014-12-03 DIAGNOSIS — G4700 Insomnia, unspecified: Secondary | ICD-10-CM | POA: Insufficient documentation

## 2014-12-03 DIAGNOSIS — Z79899 Other long term (current) drug therapy: Secondary | ICD-10-CM | POA: Insufficient documentation

## 2014-12-03 DIAGNOSIS — F329 Major depressive disorder, single episode, unspecified: Secondary | ICD-10-CM | POA: Insufficient documentation

## 2014-12-03 DIAGNOSIS — K567 Ileus, unspecified: Secondary | ICD-10-CM | POA: Diagnosis not present

## 2014-12-03 DIAGNOSIS — E785 Hyperlipidemia, unspecified: Secondary | ICD-10-CM | POA: Diagnosis not present

## 2014-12-03 DIAGNOSIS — R011 Cardiac murmur, unspecified: Secondary | ICD-10-CM | POA: Diagnosis not present

## 2014-12-03 DIAGNOSIS — Z91040 Latex allergy status: Secondary | ICD-10-CM | POA: Diagnosis not present

## 2014-12-03 DIAGNOSIS — H269 Unspecified cataract: Secondary | ICD-10-CM | POA: Insufficient documentation

## 2014-12-03 DIAGNOSIS — K219 Gastro-esophageal reflux disease without esophagitis: Secondary | ICD-10-CM | POA: Insufficient documentation

## 2014-12-03 DIAGNOSIS — G8929 Other chronic pain: Secondary | ICD-10-CM | POA: Insufficient documentation

## 2014-12-03 DIAGNOSIS — I10 Essential (primary) hypertension: Secondary | ICD-10-CM | POA: Diagnosis not present

## 2014-12-03 DIAGNOSIS — R1030 Lower abdominal pain, unspecified: Secondary | ICD-10-CM

## 2014-12-03 DIAGNOSIS — R109 Unspecified abdominal pain: Secondary | ICD-10-CM | POA: Diagnosis present

## 2014-12-03 LAB — COMPREHENSIVE METABOLIC PANEL
ALT: 17 U/L (ref 14–54)
AST: 25 U/L (ref 15–41)
Albumin: 4.2 g/dL (ref 3.5–5.0)
Alkaline Phosphatase: 75 U/L (ref 38–126)
Anion gap: 12 (ref 5–15)
BUN: 11 mg/dL (ref 6–20)
CHLORIDE: 100 mmol/L — AB (ref 101–111)
CO2: 27 mmol/L (ref 22–32)
CREATININE: 0.79 mg/dL (ref 0.44–1.00)
Calcium: 10.1 mg/dL (ref 8.9–10.3)
Glucose, Bld: 98 mg/dL (ref 65–99)
Potassium: 3.4 mmol/L — ABNORMAL LOW (ref 3.5–5.1)
SODIUM: 139 mmol/L (ref 135–145)
Total Bilirubin: 0.6 mg/dL (ref 0.3–1.2)
Total Protein: 7.9 g/dL (ref 6.5–8.1)

## 2014-12-03 LAB — CBC
HCT: 39.2 % (ref 36.0–46.0)
HEMOGLOBIN: 12.8 g/dL (ref 12.0–15.0)
MCH: 28.6 pg (ref 26.0–34.0)
MCHC: 32.7 g/dL (ref 30.0–36.0)
MCV: 87.5 fL (ref 78.0–100.0)
Platelets: 257 10*3/uL (ref 150–400)
RBC: 4.48 MIL/uL (ref 3.87–5.11)
RDW: 14.2 % (ref 11.5–15.5)
WBC: 5.5 10*3/uL (ref 4.0–10.5)

## 2014-12-03 LAB — LIPASE, BLOOD: LIPASE: 33 U/L (ref 22–51)

## 2014-12-03 MED ORDER — PROMETHAZINE HCL 25 MG/ML IJ SOLN
25.0000 mg | Freq: Once | INTRAMUSCULAR | Status: AC
  Administered 2014-12-03: 25 mg via INTRAVENOUS
  Filled 2014-12-03: qty 1

## 2014-12-03 MED ORDER — IOHEXOL 300 MG/ML  SOLN
100.0000 mL | Freq: Once | INTRAMUSCULAR | Status: AC | PRN
  Administered 2014-12-03: 100 mL via INTRAVENOUS

## 2014-12-03 NOTE — ED Notes (Signed)
Pt sent here for eval of SBO: pt sts nausea and abd pain x 2 weeks intermittently

## 2014-12-03 NOTE — ED Notes (Signed)
Dr.Lockwood at bedside  

## 2014-12-03 NOTE — ED Notes (Signed)
Pt in CT.

## 2014-12-03 NOTE — ED Provider Notes (Signed)
CSN: [id number]     Arrival date & time 12/03/14  1538 History   First MD Initiated Contact with Patient 12/03/14 2054     Chief Complaint  Patient presents with  . Abdominal Pain  . Nausea     HPI  Patient presents w abd pain.  Onset was 2wk pta.  Since onset symptoms of been waxing/waning. Pain is focally in the right lateral abdomen, right lower abdomen, suprapubic region. Pain is sore, severe, not improved with miralax.  All movements have become inconsistent, though the last one was earlier today. There is associated nausea, anorexia, no vomiting. No fever, chills, chest pain, dyspnea. Patient has history of bowel obstruction, as well as prior abdominal surgery.    Past Medical History  Diagnosis Date  . COPD (chronic obstructive pulmonary disease) (HCC)   . Hypertension   . GERD (gastroesophageal reflux disease)   . Cataract   . Hyperlipidemia   . Heart murmur   . Asthma   . Chronic bronchitis (HCC)   . Insomnia   . History of blood transfusion 2003-2004    "related to bowel obstruction OR"  . Osteoarthritis     "lower back; both hips; right leg" (08/01/2014)  . Chronic lower back pain   . Depression    Past Surgical History  Procedure Laterality Date  . Abdominal hysterectomy  2003  . Bowel resection  2003; 2004    x2 with primary anastamosis  . Total knee arthroplasty Right 03/2012    [facility]  . Laparoscopic incisional / umbilical / ventral hernia repair  2012    [facility], Dr. Carolynn Sayers  . Abdominal exploration surgery  2004    abdominal compartment syndrome  . Abdominal exploration surgery  2003    small bowel prolapse through vagina  . Tonsillectomy  1977  . Adenoidectomy  2002  . Hernia repair    . Joint replacement    . Combined hysteroscopy diagnostic / d&c  ~ 2003   Family History  Problem Relation Age of Onset  . Cancer Mother   . Heart attack Father   . Colon cancer Maternal Grandmother   . Colon cancer Paternal Grandmother    Social History   Substance Use Topics  . Smoking status: Never Smoker   . Smokeless tobacco: Never Used  . Alcohol Use: No   OB History    No data available     Review of Systems  Constitutional:       Per HPI, otherwise negative  HENT:       Per HPI, otherwise negative  Respiratory:       Per HPI, otherwise negative  Cardiovascular:       Per HPI, otherwise negative  Gastrointestinal: Positive for abdominal pain, constipation and abdominal distention. Negative for vomiting.  Endocrine:       Negative aside from HPI  Genitourinary:       Neg aside from HPI   Musculoskeletal:       Per HPI, otherwise negative  Skin: Negative.   Neurological: Negative for syncope.      Allergies  Bee venom; Peanuts; Shellfish allergy; Aspirin; Corn-containing products; Ibuprofen; Latex; Morphine and related; and Zofran  Home Medications   Prior to Admission medications   Medication Sig Start Date End Date Taking? Authorizing Provider  acetaminophen (TYLENOL) 500 MG tablet Take 500 mg by mouth every 6 (six) hours as needed for mild pain or moderate pain.   Yes Historical Provider, MD  albuterol (PROVENTIL HFA;VENTOLIN HFA) 108 (  90 BASE) MCG/ACT inhaler Inhale 1 puff into the lungs every 6 (six) hours as needed for wheezing or shortness of breath (wheezing).    Yes Historical Provider, MD  amLODipine (NORVASC) 10 MG tablet Take 10 mg by mouth daily.   Yes Historical Provider, MD  diclofenac sodium (VOLTAREN) 1 % GEL Apply 2 g topically 4 (four) times daily as needed (knee pain).    Yes Historical Provider, MD  diphenhydrAMINE (BENADRYL) 25 MG tablet Take 25 mg by mouth every 6 (six) hours as needed for allergies (allergies).   Yes Historical Provider, MD  esomeprazole (NEXIUM) 40 MG capsule Take 40 mg by mouth daily at 12 noon.   Yes Historical Provider, MD  Fluticasone-Salmeterol (ADVAIR) 500-50 MCG/DOSE AEPB Inhale 1 puff into the lungs 2 (two) times daily.   Yes Historical Provider, MD  ipratropium  (ATROVENT) 0.06 % nasal spray Place 2 sprays into the nose 4 (four) times daily. Patient taking differently: Place 2 sprays into the nose 4 (four) times daily as needed for rhinitis.  05/05/13  Yes Linna Hoff, MD  lisinopril (PRINIVIL,ZESTRIL) 20 MG tablet Take 20 mg by mouth daily.   Yes Historical Provider, MD  loratadine (CLARITIN) 10 MG tablet Take 10 mg by mouth daily.   Yes Historical Provider, MD  LORazepam (ATIVAN) 0.5 MG tablet Take 0.5 mg by mouth every 6 (six) hours as needed for anxiety (anxiety).    Yes Historical Provider, MD  meloxicam (MOBIC) 15 MG tablet Take 15 mg by mouth daily as needed for pain.  09/18/14  Yes Historical Provider, MD  mometasone (NASONEX) 50 MCG/ACT nasal spray USE 1 PUFF IN THE NOSTRILS TWICE A DAY AS NEEDED FOR CONGESTION 07/14/14  Yes Historical Provider, MD  Multiple Vitamin (MULTIVITAMIN WITH MINERALS) TABS tablet Take 1 tablet by mouth daily.   Yes Historical Provider, MD  polyethylene glycol (MIRALAX / GLYCOLAX) packet Take 17 g by mouth daily as needed for mild constipation or moderate constipation (constipation).    Yes Historical Provider, MD  promethazine (PHENERGAN) 25 MG tablet Take 25 mg by mouth every 8 (eight) hours as needed for nausea or vomiting (nausea).  08/20/13  Yes Historical Provider, MD  Propylene Glycol (SYSTANE BALANCE OP) Place 1 drop into the right eye daily.    Yes Historical Provider, MD  rosuvastatin (CRESTOR) 10 MG tablet Take 10 mg by mouth daily.   Yes Historical Provider, MD  sertraline (ZOLOFT) 100 MG tablet Take 100 mg by mouth daily.   Yes Historical Provider, MD  tiotropium (SPIRIVA) 18 MCG inhalation capsule Place 18 mcg into inhaler and inhale daily.   Yes Historical Provider, MD  traZODone (DESYREL) 100 MG tablet Take 100 mg by mouth at bedtime as needed for sleep.  06/09/14  Yes Historical Provider, MD  triamterene-hydrochlorothiazide (MAXZIDE-25) 37.5-25 MG tablet Take 1 tablet by mouth daily. 11/06/14  Yes Historical  Provider, MD  bisacodyl (DULCOLAX) 10 MG suppository Place 1 suppository (10 mg total) rectally daily as needed for moderate constipation. Patient not taking: Reported on 08/21/2014 08/03/14   [name], MD  traMADol (ULTRAM) 50 MG tablet Take 1 tablet (50 mg total) by mouth every 6 (six) hours as needed for moderate pain. Patient not taking: Reported on 08/21/2014 12/28/13   Linna Hoff, MD  traMADol (ULTRAM) 50 MG tablet Take 1 tablet (50 mg total) by mouth every 6 (six) hours as needed. Patient not taking: Reported on 12/03/2014 07/31/14   Arby Barrette, MD   BP 117/60  mmHg  Pulse 84  Temp(Src) 98.6 F (37 C) (Oral)  Resp 16  SpO2 97% Physical Exam  Constitutional: She is oriented to person, place, and time. She appears well-developed and well-nourished. No distress.  HENT:  Head: Normocephalic and atraumatic.  Eyes: Conjunctivae and EOM are normal.  Cardiovascular: Normal rate and regular rhythm.   Pulmonary/Chest: Effort normal and breath sounds normal. No stridor. No respiratory distress.  Abdominal: She exhibits no distension. There is tenderness in the right upper quadrant, right lower quadrant and suprapubic area.  Musculoskeletal: She exhibits no edema.  Neurological: She is alert and oriented to person, place, and time. No cranial nerve deficit.  Skin: Skin is warm and dry.  Psychiatric: She has a normal mood and affect.  Nursing note and vitals reviewed.   ED Course  Procedures (including critical care time) Labs Review Labs Reviewed  COMPREHENSIVE METABOLIC PANEL - Abnormal; Notable for the following:    Potassium 3.4 (*)    Chloride 100 (*)    All other components within normal limits  LIPASE, BLOOD  CBC  URINALYSIS, ROUTINE W REFLEX MICROSCOPIC (NOT AT [facility])    Imaging Review Ct Abdomen Pelvis W Contrast  12/03/2014   CLINICAL DATA:  Right-sided abdominal pain off and on since 11/22/2014. Nausea and diarrhea.  EXAM: CT ABDOMEN AND PELVIS WITH CONTRAST   TECHNIQUE: Multidetector CT imaging of the abdomen and pelvis was performed using the standard protocol following bolus administration of intravenous contrast.  CONTRAST:  OMNIPAQUE IOHEXOL 300 MG/ML  SOLN  COMPARISON:  07/31/2014  FINDINGS: Lung bases are clear.  The liver, spleen, gallbladder, pancreas, adrenal glands, kidneys, abdominal aorta, inferior vena cava, and retroperitoneal lymph nodes are unremarkable. Dense calcification demonstrated anterior to the right kidney and inferior to the liver age appears to be extraluminal. This may represent dystrophic calcification or possibly gallstones. Similar calcifications have been present on images dating back to 08/13/2007. The stomach is decompressed. The jejunum is decompressed. There is a markedly distended loop of small bowel demonstrated in the anterior mid abdomen, similar to prior study. Contrast material is present within this loop and there is no significant wall thickening. Appearance is similar to prior study. Contrast material is demonstrated distal to this area although not seen in the colon. This may represent partial obstruction or chronic abnormal dilated segment due to stasis or postoperative change. There appears to be a surgical anastomosis in the dilated segment. Residual obstruction is however not excluded. Colon is stool filled without abnormal distention. No free air or free fluid in the abdomen. Scarring in the anterior abdominal wall consistent with postoperative change.  Pelvis: Bladder wall is not thickened. No free or loculated pelvic fluid collections. No pelvic mass or lymphadenopathy. Appendix is not definitively identified. No destructive bone lesions.  IMPRESSION: Prominent small bowel distention in the anterior lower abdomen similar to prior study. Appearance could represent an abnormal chronically dilated segment versus residual obstruction. No significant change since prior study. Postoperative changes in the anterior  abdominal wall.   Electronically Signed   By: Burman Nieves M.D.   On: 12/03/2014 23:40   In comparison to CT scans from earlier this year, both June, February, days imaging looks better, with passage of contrast material past the dilated loop of bowel, which was likely a result of her prior surgery.   I have personally reviewed and evaluated these images and lab results as part of my medical decision-making.  On repeat exam the patient is calm, resting,  hemodynamically stable. She has been drinking soda.   MDM   Final diagnoses:  Abdominal pain, lower  Ileus (HCC)   Patient with history of abdominal surgery, prior polyp structure presents with new concern for nausea, decreased bowel movements. However, the patient does continue to have bowel movements, is tolerant of oral intake, and CT scan is more consistent with ileus with no evidence for acute obstruction. After lengthy conversation with the patient about all findings, patient was discharged in stable condition to follow-up with her gastroenterologist, and primary care team.   Gerhard Munch, MD 12/04/14 [phone]

## 2014-12-04 NOTE — Discharge Instructions (Signed)
As discussed, your evaluation today has been largely reassuring.  But, it is important that you monitor your condition carefully, and do not hesitate to return to the ED if you develop new, or concerning changes in your condition.  Your presentation is most consistent with ileus, or slower bowel movements.  Tomorrow, please be sure to drink only liquids, and do not hesitate to return if you develop new, or concerning changes in your condition.  Otherwise, please follow-up with your physician for appropriate ongoing care.

## 2014-12-05 ENCOUNTER — Inpatient Hospital Stay: Admission: RE | Admit: 2014-12-05 | Payer: Medicare HMO | Source: Ambulatory Visit

## 2014-12-05 ENCOUNTER — Other Ambulatory Visit: Payer: Medicare HMO

## 2015-02-19 ENCOUNTER — Ambulatory Visit
Admission: RE | Admit: 2015-02-19 | Discharge: 2015-02-19 | Disposition: A | Discharge status: Home / Self Care | Payer: Medicare HMO | Source: Ambulatory Visit | Attending: Nurse Practitioner | Admitting: Nurse Practitioner

## 2015-02-19 ENCOUNTER — Other Ambulatory Visit: Payer: Self-pay | Admitting: Nurse Practitioner

## 2015-02-19 DIAGNOSIS — N6489 Other specified disorders of breast: Secondary | ICD-10-CM

## 2015-02-19 DIAGNOSIS — R921 Mammographic calcification found on diagnostic imaging of breast: Secondary | ICD-10-CM

## 2015-03-04 ENCOUNTER — Emergency Department (HOSPITAL_COMMUNITY)
Admission: EM | Admit: 2015-03-04 | Discharge: 2015-03-04 | Disposition: A | Discharge status: Home / Self Care | Payer: Medicare HMO | Attending: Emergency Medicine | Admitting: Emergency Medicine

## 2015-03-04 ENCOUNTER — Encounter (HOSPITAL_COMMUNITY): Payer: Self-pay | Admitting: Emergency Medicine

## 2015-03-04 ENCOUNTER — Emergency Department (HOSPITAL_COMMUNITY): Payer: Medicare HMO

## 2015-03-04 DIAGNOSIS — J449 Chronic obstructive pulmonary disease, unspecified: Secondary | ICD-10-CM | POA: Insufficient documentation

## 2015-03-04 DIAGNOSIS — G8929 Other chronic pain: Secondary | ICD-10-CM | POA: Diagnosis not present

## 2015-03-04 DIAGNOSIS — R05 Cough: Secondary | ICD-10-CM | POA: Diagnosis present

## 2015-03-04 DIAGNOSIS — R011 Cardiac murmur, unspecified: Secondary | ICD-10-CM | POA: Insufficient documentation

## 2015-03-04 DIAGNOSIS — M549 Dorsalgia, unspecified: Secondary | ICD-10-CM | POA: Diagnosis not present

## 2015-03-04 DIAGNOSIS — Z91040 Latex allergy status: Secondary | ICD-10-CM | POA: Diagnosis not present

## 2015-03-04 DIAGNOSIS — M160 Bilateral primary osteoarthritis of hip: Secondary | ICD-10-CM | POA: Diagnosis not present

## 2015-03-04 DIAGNOSIS — I10 Essential (primary) hypertension: Secondary | ICD-10-CM | POA: Insufficient documentation

## 2015-03-04 DIAGNOSIS — M47816 Spondylosis without myelopathy or radiculopathy, lumbar region: Secondary | ICD-10-CM | POA: Insufficient documentation

## 2015-03-04 DIAGNOSIS — Z79899 Other long term (current) drug therapy: Secondary | ICD-10-CM | POA: Insufficient documentation

## 2015-03-04 DIAGNOSIS — Z7951 Long term (current) use of inhaled steroids: Secondary | ICD-10-CM | POA: Insufficient documentation

## 2015-03-04 DIAGNOSIS — F329 Major depressive disorder, single episode, unspecified: Secondary | ICD-10-CM | POA: Diagnosis not present

## 2015-03-04 DIAGNOSIS — E785 Hyperlipidemia, unspecified: Secondary | ICD-10-CM | POA: Insufficient documentation

## 2015-03-04 DIAGNOSIS — Z791 Long term (current) use of non-steroidal anti-inflammatories (NSAID): Secondary | ICD-10-CM | POA: Diagnosis not present

## 2015-03-04 DIAGNOSIS — R059 Cough, unspecified: Secondary | ICD-10-CM

## 2015-03-04 DIAGNOSIS — K219 Gastro-esophageal reflux disease without esophagitis: Secondary | ICD-10-CM | POA: Diagnosis not present

## 2015-03-04 MED ORDER — ACETAMINOPHEN 325 MG PO TABS
650.0000 mg | ORAL_TABLET | Freq: Once | ORAL | Status: AC
  Administered 2015-03-04: 650 mg via ORAL
  Filled 2015-03-04: qty 2

## 2015-03-04 MED ORDER — BENZONATATE 100 MG PO CAPS: 100.0000 mg | ORAL_CAPSULE | Freq: Three times a day (TID) | ORAL | Status: DC | PRN

## 2015-03-04 MED ORDER — METHOCARBAMOL 500 MG PO TABS
750.0000 mg | ORAL_TABLET | Freq: Once | ORAL | Status: AC
  Administered 2015-03-04: 750 mg via ORAL
  Filled 2015-03-04: qty 2

## 2015-03-04 MED ORDER — GUAIFENESIN ER 600 MG PO TB12: 600.0000 mg | ORAL_TABLET | Freq: Two times a day (BID) | ORAL | Status: DC

## 2015-03-04 MED ORDER — METHOCARBAMOL 500 MG PO TABS: 500.0000 mg | ORAL_TABLET | Freq: Two times a day (BID) | ORAL | Status: DC | PRN

## 2015-03-04 NOTE — ED Provider Notes (Signed)
CSN: [id number]     Arrival date & time 03/04/15  1026 History   First MD Initiated Contact with Patient 03/04/15 1032     Chief Complaint  Patient presents with  . Cough    HPI   [name] is an 55 y.o. female with history of COPD, HTN, GERD, HLD who presents to the ED for evaluation of cough. She states she has had a cough for the past three days. She reports the cough is productive of clear-white sputum. She states she is here today because she coughed up some blood-tinged sputum a few times yesterday. She also states that her back hurts all over from coughing so hard. Denies chest pain. She denies SOB or wheezing at home. States she has been using her COPD inhalers as prescribed and has not required her albuterol rescue inhaler. Denies fever or chills. Denies body aches, abdominal pain, N/V/D. Denies sick contacts.   Past Medical History  Diagnosis Date  . COPD (chronic obstructive pulmonary disease) (HCC)   . Hypertension   . GERD (gastroesophageal reflux disease)   . Cataract   . Hyperlipidemia   . Heart murmur   . Asthma   . Chronic bronchitis (HCC)   . Insomnia   . History of blood transfusion 2003-2004    "related to bowel obstruction OR"  . Osteoarthritis     "lower back; both hips; right leg" (08/01/2014)  . Chronic lower back pain   . Depression    Past Surgical History  Procedure Laterality Date  . Abdominal hysterectomy  2003  . Bowel resection  2003; 2004    x2 with primary anastamosis  . Total knee arthroplasty Right 03/2012    [facility]  . Laparoscopic incisional / umbilical / ventral hernia repair  2012    [facility], Dr. Carolynn Sayers  . Abdominal exploration surgery  2004    abdominal compartment syndrome  . Abdominal exploration surgery  2003    small bowel prolapse through vagina  . Tonsillectomy  1977  . Adenoidectomy  2002  . Hernia repair    . Joint replacement    . Combined hysteroscopy diagnostic / d&c  ~ 2003   Family History  Problem Relation Age of Onset   . Cancer Mother   . Heart attack Father   . Colon cancer Maternal Grandmother   . Colon cancer Paternal Grandmother    Social History  Substance Use Topics  . Smoking status: Never Smoker   . Smokeless tobacco: Never Used  . Alcohol Use: No   OB History    No data available     Review of Systems  All other systems reviewed and are negative.     Allergies  Bee venom; Peanuts; Shellfish allergy; Aspirin; Corn-containing products; Ibuprofen; Latex; Morphine and related; and Zofran  Home Medications   Prior to Admission medications   Medication Sig Start Date End Date Taking? Authorizing Provider  acetaminophen (TYLENOL) 500 MG tablet Take 500 mg by mouth every 6 (six) hours as needed for mild pain or moderate pain.    Historical Provider, MD  albuterol (PROVENTIL HFA;VENTOLIN HFA) 108 (90 BASE) MCG/ACT inhaler Inhale 1 puff into the lungs every 6 (six) hours as needed for wheezing or shortness of breath (wheezing).     Historical Provider, MD  amLODipine (NORVASC) 10 MG tablet Take 10 mg by mouth daily.    Historical Provider, MD  bisacodyl (DULCOLAX) 10 MG suppository Place 1 suppository (10 mg total) rectally daily as needed for moderate  constipation. Patient not taking: Reported on 08/21/2014 08/03/14   [name], MD  diclofenac sodium (VOLTAREN) 1 % GEL Apply 2 g topically 4 (four) times daily as needed (knee pain).     Historical Provider, MD  diphenhydrAMINE (BENADRYL) 25 MG tablet Take 25 mg by mouth every 6 (six) hours as needed for allergies (allergies).    Historical Provider, MD  esomeprazole (NEXIUM) 40 MG capsule Take 40 mg by mouth daily at 12 noon.    Historical Provider, MD  Fluticasone-Salmeterol (ADVAIR) 500-50 MCG/DOSE AEPB Inhale 1 puff into the lungs 2 (two) times daily.    Historical Provider, MD  ipratropium (ATROVENT) 0.06 % nasal spray Place 2 sprays into the nose 4 (four) times daily. Patient taking differently: Place 2 sprays into the nose 4 (four)  times daily as needed for rhinitis.  05/05/13   Linna Hoff, MD  lisinopril (PRINIVIL,ZESTRIL) 20 MG tablet Take 20 mg by mouth daily.    Historical Provider, MD  loratadine (CLARITIN) 10 MG tablet Take 10 mg by mouth daily.    Historical Provider, MD  LORazepam (ATIVAN) 0.5 MG tablet Take 0.5 mg by mouth every 6 (six) hours as needed for anxiety (anxiety).     Historical Provider, MD  meloxicam (MOBIC) 15 MG tablet Take 15 mg by mouth daily as needed for pain.  09/18/14   Historical Provider, MD  mometasone (NASONEX) 50 MCG/ACT nasal spray USE 1 PUFF IN THE NOSTRILS TWICE A DAY AS NEEDED FOR CONGESTION 07/14/14   Historical Provider, MD  Multiple Vitamin (MULTIVITAMIN WITH MINERALS) TABS tablet Take 1 tablet by mouth daily.    Historical Provider, MD  polyethylene glycol (MIRALAX / GLYCOLAX) packet Take 17 g by mouth daily as needed for mild constipation or moderate constipation (constipation).     Historical Provider, MD  promethazine (PHENERGAN) 25 MG tablet Take 25 mg by mouth every 8 (eight) hours as needed for nausea or vomiting (nausea).  08/20/13   Historical Provider, MD  Propylene Glycol (SYSTANE BALANCE OP) Place 1 drop into the right eye daily.     Historical Provider, MD  rosuvastatin (CRESTOR) 10 MG tablet Take 10 mg by mouth daily.    Historical Provider, MD  sertraline (ZOLOFT) 100 MG tablet Take 100 mg by mouth daily.    Historical Provider, MD  tiotropium (SPIRIVA) 18 MCG inhalation capsule Place 18 mcg into inhaler and inhale daily.    Historical Provider, MD  traMADol (ULTRAM) 50 MG tablet Take 1 tablet (50 mg total) by mouth every 6 (six) hours as needed for moderate pain. Patient not taking: Reported on 08/21/2014 12/28/13   Linna Hoff, MD  traMADol (ULTRAM) 50 MG tablet Take 1 tablet (50 mg total) by mouth every 6 (six) hours as needed. Patient not taking: Reported on 12/03/2014 07/31/14   Arby Barrette, MD  traZODone (DESYREL) 100 MG tablet Take 100 mg by mouth at bedtime as  needed for sleep.  06/09/14   Historical Provider, MD  triamterene-hydrochlorothiazide (MAXZIDE-25) 37.5-25 MG tablet Take 1 tablet by mouth daily. 11/06/14   Historical Provider, MD   There were no vitals taken for this visit. Physical Exam  Constitutional: She is oriented to person, place, and time.  HENT:  Right Ear: External ear normal.  Left Ear: External ear normal.  Nose: Nose normal.  Mouth/Throat: Mucous membranes are dry. No oropharyngeal exudate.  Mild posterior oropharyngeal cobblestoning  Eyes: Conjunctivae and EOM are normal. Pupils are equal, round, and reactive to light.  Neck: Normal range of motion. Neck supple.  Cardiovascular: Normal rate, regular rhythm, normal heart sounds and intact distal pulses.   Pulmonary/Chest: Effort normal and breath sounds normal. No respiratory distress. She has no wheezes. She has no rales. She exhibits no tenderness.  Abdominal: Soft. Bowel sounds are normal. She exhibits no distension. There is no tenderness.  Musculoskeletal: She exhibits no edema.  Paraspinal musculature is diffusely ttp.   Neurological: She is alert and oriented to person, place, and time. No cranial nerve deficit.  Skin: Skin is warm and dry.  Psychiatric: She has a normal mood and affect.  Nursing note and vitals reviewed.    ED Course  Procedures (including critical care time) Labs Review Labs Reviewed - No data to display  Imaging Review Dg Chest 2 View  03/04/2015  CLINICAL DATA:  Hemoptysis, cough, chest pain, shortness of breath and fever for 2 days. EXAM: CHEST  2 VIEW COMPARISON:  03/09/2009 FINDINGS: The heart size and mediastinal contours are within normal limits. Both lungs are clear. The visualized skeletal structures are unremarkable. IMPRESSION: No active cardiopulmonary disease. Electronically Signed   By: Charlett NoseKevin  Dover M.D.   On: 03/04/2015 11:14   I have personally reviewed and evaluated these images and lab results as part of my medical  decision-making.   EKG Interpretation None      MDM   Final diagnoses:  Cough  Bilateral back pain, unspecified location    Pt is an 55 y.o. female presenting for evaluation of cough. She is afebrile, not tachycardic, not hypoxic on RA. She has no wheezes, crackles, or rhonchi on her lung exam. She is not working hard to breathe. Her CXR is unremarkable. Tylenol and robaxin given here for her diffuse back pain which I agree is likely musculoskeletal due to coughing. Will give rx for same. I suspect viral vs. Allergic/seasonal etiology to pt's symptoms. Will also give rx for tessalon and mucinex. Resource guide given for PCP follow up.     Carlene CoriaSerena Y Letishia Elliott, PA-C 03/04/15 1638  Rolan BuccoMelanie Belfi, MD 03/04/15 [phone]

## 2015-03-04 NOTE — ED Notes (Signed)
Pt states she has had a cough for several days was coughing up some blood/mucous.

## 2015-03-04 NOTE — Discharge Instructions (Signed)
You were seen in the emergency room today for evaluation of cough. Your chest x-ray was normal. Your exam was normal. Your symptoms are most likely due to a virus. Drink plenty of fluids to stay hydrated. I will give you prescriptions to help with your cough in addition to a muscle relaxant for your back pain. You may continue taking the tylenol you have at home as well.  Take medications as prescribed. Return to the emergency room for worsening condition or new concerning symptoms. Follow up with your regular doctor. If you don't have a regular doctor use one of the numbers below to establish a primary care doctor.   Emergency Department Resource Guide 1) Find a Doctor and Pay Out of Pocket Although you won't have to find out who is covered by your insurance plan, it is a good idea to ask around and get recommendations. You will then need to call the office and see if the doctor you have chosen will accept you as a new patient and what types of options they offer for patients who are self-pay. Some doctors offer discounts or will set up payment plans for their patients who do not have insurance, but you will need to ask so you aren't surprised when you get to your appointment.  2) Contact Your Local Health Department Not all health departments have doctors that can see patients for sick visits, but many do, so it is worth a call to see if yours does. If you don't know where your local health department is, you can check in your phone book. The CDC also has a tool to help you locate your state's health department, and many state websites also have listings of all of their local health departments.  3) Find a Walk-in Clinic If your illness is not likely to be very severe or complicated, you may want to try a walk in clinic. These are popping up all over the country in pharmacies, drugstores, and shopping centers. They're usually staffed by nurse practitioners or physician assistants that have been  trained to treat common illnesses and complaints. They're usually fairly quick and inexpensive. However, if you have serious medical issues or chronic medical problems, these are probably not your best option.  No Primary Care Doctor: - Call Health Connect at  [phone] - they can help you locate a primary care doctor that  accepts your insurance, provides certain services, etc. - Physician Referral [phone]- [phone]  Emergency Department Resource Guide 1) Find a Doctor and Pay Out of Pocket Although you won't have to find out who is covered by your insurance plan, it is a good idea to ask around and get recommendations. You will then need to call the office and see if the doctor you have chosen will accept you as a new patient and what types of options they offer for patients who are self-pay. Some doctors offer discounts or will set up payment plans for their patients who do not have insurance, but you will need to ask so you aren't surprised when you get to your appointment.  2) Contact Your Local Health Department Not all health departments have doctors that can see patients for sick visits, but many do, so it is worth a call to see if yours does. If you don't know where your local health department is, you can check in your phone book. The CDC also has a tool to help you locate your state's health department, and many state websites also have listings  of all of their local health departments. ° °3) Find a Walk-in Clinic °If your illness is not likely to be very severe or complicated, you may want to try a walk in clinic. These are popping up all over the country in pharmacies, drugstores, and shopping centers. They're usually staffed by nurse practitioners or physician assistants that have been trained to treat common illnesses and complaints. They're usually fairly quick and inexpensive. However, if you have serious medical issues or chronic medical problems, these are probably not your best  option. ° °No Primary Care Doctor: °- Call Health Connect at  [phone] - they can help you locate a primary care doctor that  accepts your insurance, provides certain services, etc. °- Physician Referral Service- [phone] ° °Chronic Pain Problems: °Organization         Address  Phone   Notes  °[facility]  ([phone] Patients need to be referred by their primary care doctor.  ° °Medication Assistance: °Organization         Address  Phone   Notes  °[place] [facility] [address] °[place], [place] [address] [phone]) [phone] --Must be a resident of [place] °-- Must have NO insurance coverage whatsoever (no Medicaid/ Medicare, etc.) °-- The pt. MUST have a primary care doctor that directs their care regularly and follows them in the community °  °[facility]  (866) 331-1348   °[employer]  (888) 892-1162   ° °Agencies that provide inexpensive medical care: °Organization         Address  Phone   Notes  °[facility]  ([phone]) [phone]   ° Internal Medicine    ([phone]) [phone]   °[facility] [address] °[place], [place] [address] [phone]) [phone]   °[facility] [address], °[place] ([phone]) [phone]   °[facility]    (336) 373-0678   °[facility]    (336) 272-1050   °[facility] ° [address], [place] Phone:  (336) [phone], Fax:  ([phone] Hours of Operation:  9 am - 6 pm, M-F.  Also accepts Medicaid/Medicare and self-pay.  °[facility] ° [address], [place] Phone: ([phone]) [phone], Fax: [phone]. Hours of Operation:  8:30 am - 5:30 pm, M-F.  Also accepts Medicaid and self-pay.  °[facility] [place] [address], [place] Phone: ([phone]) [phone]   °[facility] [address], [place], [place] (336)[phone], Ext. 123 Mondays & Thursdays: 7-9 AM.  First 15  patients are seen on a first come, first serve basis. °  ° °Medicaid-accepting [place] Providers: ° °Organization         Address  Phone   Notes  °[facility] [address], [place] ([phone]) [phone] Also accepts self-pay patients.  °[facility] [address], [place] ° (336) [phone]   °[facility] [address], [place] ([phone]) [phone]   °[facility] [address] [place] [address], [place] [phone]) [phone]   °[facility] [address], [place]  ° ([phone]) [phone] Only accepts [place] Access Medicaid patients after they have their name applied to their card.  ° °Self-Pay (no insurance) in [place]: ° °Organization         Address  Phone   Notes  °  Sickle Cell Patients, [facility] [address] [place], [place] [phone]   [facility] [address] [place], [place] [phone]   [facility]  [address], [place] [phone]   [facility]/Dr. Osei-Bonsu  [address], [place] or [address], [place] [phone] Phone number for both [place] and [place] locations is the same.  [facility] [address] [place] [phone]   [facility] [address] [place] or [address] [phone]   [facility] [address] [place] [phone], phone; [phone], fax Sees patients 1st and 3rd Saturday of every month.  Must not qualify for public or private insurance (i.e. Medicaid, Medicare, [place] Health Choice, Veterans' Benefits)  Household income should be no more than 200% of the poverty level The clinic cannot treat you if you are pregnant or think you are pregnant  Sexually transmitted diseases are not treated at the clinic.

## 2015-06-02 ENCOUNTER — Emergency Department (HOSPITAL_COMMUNITY): Payer: Medicare HMO

## 2015-06-02 ENCOUNTER — Emergency Department (HOSPITAL_COMMUNITY)
Admission: EM | Admit: 2015-06-02 | Discharge: 2015-06-02 | Disposition: A | Discharge status: Home / Self Care | Payer: Medicare HMO | Attending: Emergency Medicine | Admitting: Emergency Medicine

## 2015-06-02 ENCOUNTER — Encounter (HOSPITAL_COMMUNITY): Payer: Self-pay | Admitting: *Deleted

## 2015-06-02 DIAGNOSIS — H269 Unspecified cataract: Secondary | ICD-10-CM | POA: Insufficient documentation

## 2015-06-02 DIAGNOSIS — M25561 Pain in right knee: Secondary | ICD-10-CM | POA: Diagnosis not present

## 2015-06-02 DIAGNOSIS — R011 Cardiac murmur, unspecified: Secondary | ICD-10-CM | POA: Diagnosis not present

## 2015-06-02 DIAGNOSIS — Z7951 Long term (current) use of inhaled steroids: Secondary | ICD-10-CM | POA: Diagnosis not present

## 2015-06-02 DIAGNOSIS — J449 Chronic obstructive pulmonary disease, unspecified: Secondary | ICD-10-CM | POA: Diagnosis not present

## 2015-06-02 DIAGNOSIS — Z9889 Other specified postprocedural states: Secondary | ICD-10-CM | POA: Diagnosis not present

## 2015-06-02 DIAGNOSIS — E785 Hyperlipidemia, unspecified: Secondary | ICD-10-CM | POA: Insufficient documentation

## 2015-06-02 DIAGNOSIS — K219 Gastro-esophageal reflux disease without esophagitis: Secondary | ICD-10-CM | POA: Diagnosis not present

## 2015-06-02 DIAGNOSIS — Z91040 Latex allergy status: Secondary | ICD-10-CM | POA: Insufficient documentation

## 2015-06-02 DIAGNOSIS — M158 Other polyosteoarthritis: Secondary | ICD-10-CM | POA: Insufficient documentation

## 2015-06-02 DIAGNOSIS — F329 Major depressive disorder, single episode, unspecified: Secondary | ICD-10-CM | POA: Insufficient documentation

## 2015-06-02 DIAGNOSIS — G4700 Insomnia, unspecified: Secondary | ICD-10-CM | POA: Diagnosis not present

## 2015-06-02 DIAGNOSIS — I10 Essential (primary) hypertension: Secondary | ICD-10-CM | POA: Diagnosis not present

## 2015-06-02 DIAGNOSIS — Z79899 Other long term (current) drug therapy: Secondary | ICD-10-CM | POA: Diagnosis not present

## 2015-06-02 DIAGNOSIS — G8929 Other chronic pain: Secondary | ICD-10-CM | POA: Diagnosis not present

## 2015-06-02 MED ORDER — TRAMADOL HCL 50 MG PO TABS: 50.0000 mg | ORAL_TABLET | Freq: Four times a day (QID) | ORAL | Status: DC | PRN

## 2015-06-02 NOTE — ED Notes (Signed)
Pt reports right knee pain and swelling x 1 month. Unsure of any injury, hx of knee replacement in 2012. Ambulatory at triage.

## 2015-06-02 NOTE — Discharge Instructions (Signed)
Knee Pain Knee pain is a very common symptom and can have many causes. Knee pain often goes away when you follow your health care provider's instructions for relieving pain and discomfort at home. However, knee pain can develop into a condition that needs treatment. Some conditions may include:  Arthritis caused by wear and tear (osteoarthritis).  Arthritis caused by swelling and irritation (rheumatoid arthritis or gout).  A cyst or growth in your knee.  An infection in your knee joint.  An injury that will not heal.  Damage, swelling, or irritation of the tissues that support your knee (torn ligaments or tendinitis). If your knee pain continues, additional tests may be ordered to diagnose your condition. Tests may include X-rays or other imaging studies of your knee. You may also need to have fluid removed from your knee. Treatment for ongoing knee pain depends on the cause, but treatment may include:  Medicines to relieve pain or swelling.  Steroid injections in your knee.  Physical therapy.  Surgery. HOME CARE INSTRUCTIONS  Take medicines only as directed by your health care provider.  Rest your knee and keep it raised (elevated) while you are resting.  Do not do things that cause or worsen pain.  Avoid high-impact activities or exercises, such as running, jumping rope, or doing jumping jacks.  Apply ice to the knee area:  Put ice in a plastic bag.  Place a towel between your skin and the bag.  Leave the ice on for 20 minutes, 2-3 times a day.  Ask your health care provider if you should wear an elastic knee support.  Keep a pillow under your knee when you sleep.  Lose weight if you are overweight. Extra weight can put pressure on your knee.  Do not use any tobacco products, including cigarettes, chewing tobacco, or electronic cigarettes. If you need help quitting, ask your health care provider. Smoking may slow the healing of any bone and joint problems that you may  have. SEEK MEDICAL CARE IF:  Your knee pain continues, changes, or gets worse.  You have a fever along with knee pain.  Your knee buckles or locks up.  Your knee becomes more swollen. SEEK IMMEDIATE MEDICAL CARE IF:   Your knee joint feels hot to the touch.  You have chest pain or trouble breathing.   This information is not intended to replace advice given to you by your health care provider. Make sure you discuss any questions you have with your health care provider.  Keep scheduled appointment with your primary care provider for tomorrow. Keep knee immobilizer as much as possible and avoid bearing weight on it. Keep leg elevated as much as possible. He may apply ice to the affected area. Take tramadol as needed for pain. Return to the ER if you experience significant swelling, chest pain, shortness of breath, calf pain or tenderness.

## 2015-06-02 NOTE — ED Provider Notes (Signed)
CSN: [id number]     Arrival date & time 06/02/15  1129 History  By signing my name below, I, Ronney LionSuzanne Le, attest that this documentation has been prepared under the direction and in the presence of Jazell Rosenau, PA-C. Electronically Signed: Ronney LionSuzanne Le, ED Scribe. 06/02/2015. 3:23 PM.    Chief Complaint  Patient presents with  . Knee Pain   The history is provided by the patient. No language interpreter was used.   HPI Comments: [name] is a 55 y.o. female with a history of total right knee arthroplasty in 2014 performed by Dr. Silas FloodBradley Vaughn, osteoarthritis and chronic lower back pain, who presents to the Emergency Department complaining of constant, gradually worsening, right knee pain and swelling that began 1 month ago, after she had gone to the gym 1 month ago and had "jarred" her knee while getting on an exercise bike "with the seat up too high." Patient states she has been taking Aleve constantly with moderate relief and came in to get checked out, as she does not want to be taking Aleve constantly.  She reports she is able to ambulate with a limp. She reports [facility] and has an appointment with her PCP tomorrow. She denies calf tenderness, chest pain, or SOB. She denies being on any anticoagulants. She also denies a history of blood clots.   Past Medical History  Diagnosis Date  . COPD (chronic obstructive pulmonary disease) (HCC)   . Hypertension   . GERD (gastroesophageal reflux disease)   . Cataract   . Hyperlipidemia   . Heart murmur   . Asthma   . Chronic bronchitis (HCC)   . Insomnia   . History of blood transfusion 2003-2004    "related to bowel obstruction OR"  . Osteoarthritis     "lower back; both hips; right leg" (08/01/2014)  . Chronic lower back pain   . Depression    Past Surgical History  Procedure Laterality Date  . Abdominal hysterectomy  2003  . Bowel resection  2003; 2004    x2 with primary anastamosis  .  Total knee arthroplasty Right 03/2012    [facility]  . Laparoscopic incisional / umbilical / ventral hernia repair  2012    [facility], Dr. Carolynn Sayers[facility]ott  . Abdominal exploration surgery  2004    abdominal compartment syndrome  . Abdominal exploration surgery  2003    small bowel prolapse through vagina  . Tonsillectomy  1977  . Adenoidectomy  2002  . Hernia repair    . Joint replacement    . Combined hysteroscopy diagnostic / d&c  ~ 2003   Family History  Problem Relation Age of Onset  . Cancer Mother   . Heart attack Father   . Colon cancer Maternal Grandmother   . Colon cancer Paternal Grandmother    Social History  Substance Use Topics  . Smoking status: Never Smoker   . Smokeless tobacco: Never Used  . Alcohol Use: No   OB History    No data available     Review of Systems  Respiratory: Negative for shortness of breath.   Cardiovascular: Negative for chest pain.  Musculoskeletal: Positive for joint swelling (right knee swelling) and arthralgias (right knee pain).  Hematological: Does not bruise/bleed easily.    Allergies  Bee venom; Peanuts; Shellfish allergy; Aspirin; Corn-containing products; Ibuprofen; Latex; Morphine and related; and Zofran  Home Medications   Prior to Admission medications   Medication Sig Start Date End Date  Taking? Authorizing Provider  acetaminophen (TYLENOL) 500 MG tablet Take 500 mg by mouth every 6 (six) hours as needed for mild pain or moderate pain.    Historical Provider, MD  albuterol (PROVENTIL HFA;VENTOLIN HFA) 108 (90 BASE) MCG/ACT inhaler Inhale 1 puff into the lungs every 6 (six) hours as needed for wheezing or shortness of breath (wheezing).     Historical Provider, MD  amLODipine (NORVASC) 10 MG tablet Take 10 mg by mouth daily.    Historical Provider, MD  benzonatate (TESSALON) 100 MG capsule Take 1 capsule (100 mg total) by mouth 3 (three) times daily as needed for cough. 03/04/15   Ace Gins Sam, PA-C  bisacodyl (DULCOLAX) 10 MG suppository  Place 1 suppository (10 mg total) rectally daily as needed for moderate constipation. 08/03/14   [name], MD  diclofenac sodium (VOLTAREN) 1 % GEL Apply 2 g topically 4 (four) times daily as needed (knee pain).     Historical Provider, MD  diphenhydrAMINE (BENADRYL) 25 MG tablet Take 25 mg by mouth every 6 (six) hours as needed for allergies (allergies).    Historical Provider, MD  esomeprazole (NEXIUM) 40 MG capsule Take 40 mg by mouth daily at 12 noon.    Historical Provider, MD  Fluticasone-Salmeterol (ADVAIR) 500-50 MCG/DOSE AEPB Inhale 1 puff into the lungs 2 (two) times daily.    Historical Provider, MD  Fluticasone-Salmeterol (ADVAIR) 500-50 MCG/DOSE AEPB Inhale 1 puff into the lungs 2 (two) times daily.    Historical Provider, MD  guaiFENesin (MUCINEX) 600 MG 12 hr tablet Take 1 tablet (600 mg total) by mouth 2 (two) times daily. 03/04/15   Ace Gins Sam, PA-C  ipratropium (ATROVENT) 0.06 % nasal spray Place 2 sprays into the nose 4 (four) times daily. Patient taking differently: Place 2 sprays into the nose 4 (four) times daily as needed for rhinitis.  05/05/13   Linna Hoff, MD  lisinopril (PRINIVIL,ZESTRIL) 20 MG tablet Take 20 mg by mouth daily.    Historical Provider, MD  loratadine (CLARITIN) 10 MG tablet Take 10 mg by mouth daily.    Historical Provider, MD  LORazepam (ATIVAN) 0.5 MG tablet Take 0.5 mg by mouth every 6 (six) hours as needed for anxiety (anxiety).     Historical Provider, MD  meloxicam (MOBIC) 15 MG tablet Take 15 mg by mouth daily as needed for pain.  09/18/14   Historical Provider, MD  methocarbamol (ROBAXIN) 500 MG tablet Take 1 tablet (500 mg total) by mouth 2 (two) times daily as needed (back pain). 03/04/15   Ace Gins Sam, PA-C  mometasone (NASONEX) 50 MCG/ACT nasal spray USE 1 PUFF IN THE NOSTRILS TWICE A DAY AS NEEDED FOR CONGESTION 07/14/14   Historical Provider, MD  Multiple Vitamin (MULTIVITAMIN WITH MINERALS) TABS tablet Take 1 tablet by mouth daily.     Historical Provider, MD  polyethylene glycol (MIRALAX / GLYCOLAX) packet Take 17 g by mouth daily as needed for mild constipation or moderate constipation (constipation).     Historical Provider, MD  promethazine (PHENERGAN) 25 MG tablet Take 25 mg by mouth every 8 (eight) hours as needed for nausea or vomiting (nausea).  08/20/13   Historical Provider, MD  Propylene Glycol (SYSTANE BALANCE OP) Place 1 drop into the right eye daily.     Historical Provider, MD  rosuvastatin (CRESTOR) 10 MG tablet Take 10 mg by mouth daily.    Historical Provider, MD  sertraline (ZOLOFT) 100 MG tablet Take 100 mg by mouth daily.  Historical Provider, MD  tiotropium (SPIRIVA) 18 MCG inhalation capsule Place 18 mcg into inhaler and inhale daily.    Historical Provider, MD  traZODone (DESYREL) 100 MG tablet Take 100 mg by mouth at bedtime as needed for sleep.  06/09/14   Historical Provider, MD  triamterene-hydrochlorothiazide (MAXZIDE-25) 37.5-25 MG tablet Take 1 tablet by mouth daily. 11/06/14   Historical Provider, MD   BP 124/87 mmHg  Pulse 100  Temp(Src) 97.9 F (36.6 C) (Oral)  Resp 18  SpO2 100% Physical Exam  Constitutional: She is oriented to person, place, and time. She appears well-developed and well-nourished. No distress.  HENT:  Head: Normocephalic and atraumatic.  Eyes: Conjunctivae are normal. Right eye exhibits no discharge. Left eye exhibits no discharge. No scleral icterus.  Cardiovascular: Normal rate.   Pulmonary/Chest: Effort normal.  Musculoskeletal: She exhibits tenderness.  Right knee pain: Negative ballottement test. Negative anterior and posterior drawer. No varus or valgus laxity. No pain with flexion or extension. Mild TTP over medial aspect of knee. No calf tenderness or swelling. Popliteal pulses present.   Neurological: She is alert and oriented to person, place, and time. Coordination normal.  Skin: Skin is warm and dry. No rash noted. She is not diaphoretic. No erythema. No  pallor.  Psychiatric: She has a normal mood and affect. Her behavior is normal.  Nursing note and vitals reviewed.   ED Course  Procedures (including critical care time)  DIAGNOSTIC STUDIES: Oxygen Saturation is 100% on RA, normal by my interpretation.    COORDINATION OF CARE: 1:33 PM - Discussed treatment plan with pt at bedside. Pt verbalized understanding and agreed to plan.   Imaging Review Dg Knee Complete 4 Views Right  06/02/2015  CLINICAL DATA:  Right knee pain and swelling for 1 month. Prior knee replacement. EXAM: RIGHT KNEE - COMPLETE 4+ VIEW COMPARISON:  08/21/2014 FINDINGS: Sequelae of total knee arthroplasty are again identified. The prosthetic components appear normally located. No acute fracture or knee joint effusion is identified. No suspicious lytic or blastic osseous lesion is identified. IMPRESSION: Prior right total knee arthroplasty without acute abnormality identified. Electronically Signed   By: Sebastian Ache M.D.   On: 06/02/2015 12:37   I have personally reviewed and evaluated these images and lab results as part of my medical decision-making.  MDM   Final diagnoses:  Right knee pain   Patient X-Ray negative for obvious fracture or dislocation. Pain managed in ED. Pt advised to follow up with orthopedics if symptoms persist for possibility of missed fracture diagnosis. Pt is low risk Well's criteria. Doubt DVT. No calf pain/tenderness. Patient given brace while in ED, conservative therapy recommended and discussed. Patient declines crutches, stating she has a cane at home that she would rather use. Patient will be dc home & is agreeable with above plan.  I personally performed the services described in this documentation, which was scribed in my presence. The recorded information has been reviewed and is accurate.       Lester Kinsman [place], PA-C 06/04/15 1106  Richardean Canal, MD 06/04/15 [phone]

## 2015-07-14 ENCOUNTER — Other Ambulatory Visit: Payer: Self-pay

## 2015-08-14 ENCOUNTER — Encounter (HOSPITAL_COMMUNITY): Payer: Self-pay | Admitting: Emergency Medicine

## 2015-08-14 ENCOUNTER — Inpatient Hospital Stay (HOSPITAL_COMMUNITY): Payer: Medicare HMO

## 2015-08-14 ENCOUNTER — Observation Stay (HOSPITAL_COMMUNITY): Payer: Medicare HMO

## 2015-08-14 ENCOUNTER — Inpatient Hospital Stay (HOSPITAL_COMMUNITY)
Admission: EM | Admit: 2015-08-14 | Discharge: 2015-08-17 | DRG: 390 | Disposition: A | Discharge status: Home / Self Care | Payer: Medicare HMO | Attending: Family Medicine | Admitting: Family Medicine

## 2015-08-14 ENCOUNTER — Emergency Department (HOSPITAL_COMMUNITY): Payer: Medicare HMO

## 2015-08-14 DIAGNOSIS — Z0189 Encounter for other specified special examinations: Secondary | ICD-10-CM

## 2015-08-14 DIAGNOSIS — J449 Chronic obstructive pulmonary disease, unspecified: Secondary | ICD-10-CM | POA: Diagnosis present

## 2015-08-14 DIAGNOSIS — G8929 Other chronic pain: Secondary | ICD-10-CM | POA: Diagnosis present

## 2015-08-14 DIAGNOSIS — K56609 Unspecified intestinal obstruction, unspecified as to partial versus complete obstruction: Secondary | ICD-10-CM | POA: Diagnosis present

## 2015-08-14 DIAGNOSIS — I10 Essential (primary) hypertension: Secondary | ICD-10-CM | POA: Diagnosis present

## 2015-08-14 DIAGNOSIS — Z9049 Acquired absence of other specified parts of digestive tract: Secondary | ICD-10-CM

## 2015-08-14 DIAGNOSIS — E876 Hypokalemia: Secondary | ICD-10-CM

## 2015-08-14 DIAGNOSIS — K565 Intestinal adhesions [bands] with obstruction (postprocedural) (postinfection): Secondary | ICD-10-CM | POA: Diagnosis present

## 2015-08-14 DIAGNOSIS — Z96651 Presence of right artificial knee joint: Secondary | ICD-10-CM | POA: Diagnosis present

## 2015-08-14 DIAGNOSIS — K56600 Partial intestinal obstruction, unspecified as to cause: Secondary | ICD-10-CM

## 2015-08-14 DIAGNOSIS — F329 Major depressive disorder, single episode, unspecified: Secondary | ICD-10-CM | POA: Diagnosis present

## 2015-08-14 DIAGNOSIS — Z800 Family history of malignant neoplasm of digestive organs: Secondary | ICD-10-CM | POA: Diagnosis not present

## 2015-08-14 DIAGNOSIS — Z90710 Acquired absence of both cervix and uterus: Secondary | ICD-10-CM

## 2015-08-14 DIAGNOSIS — K219 Gastro-esophageal reflux disease without esophagitis: Secondary | ICD-10-CM | POA: Diagnosis not present

## 2015-08-14 DIAGNOSIS — F419 Anxiety disorder, unspecified: Secondary | ICD-10-CM | POA: Diagnosis present

## 2015-08-14 DIAGNOSIS — K5669 Other intestinal obstruction: Secondary | ICD-10-CM | POA: Diagnosis not present

## 2015-08-14 DIAGNOSIS — Z8249 Family history of ischemic heart disease and other diseases of the circulatory system: Secondary | ICD-10-CM

## 2015-08-14 DIAGNOSIS — J42 Unspecified chronic bronchitis: Secondary | ICD-10-CM

## 2015-08-14 DIAGNOSIS — E785 Hyperlipidemia, unspecified: Secondary | ICD-10-CM | POA: Diagnosis present

## 2015-08-14 DIAGNOSIS — R1013 Epigastric pain: Secondary | ICD-10-CM

## 2015-08-14 DIAGNOSIS — R109 Unspecified abdominal pain: Secondary | ICD-10-CM | POA: Diagnosis present

## 2015-08-14 DIAGNOSIS — R1030 Lower abdominal pain, unspecified: Secondary | ICD-10-CM

## 2015-08-14 LAB — COMPREHENSIVE METABOLIC PANEL
ALT: 17 U/L (ref 14–54)
ANION GAP: 10 (ref 5–15)
AST: 22 U/L (ref 15–41)
Albumin: 4.5 g/dL (ref 3.5–5.0)
Alkaline Phosphatase: 71 U/L (ref 38–126)
BUN: 10 mg/dL (ref 6–20)
CHLORIDE: 104 mmol/L (ref 101–111)
CO2: 23 mmol/L (ref 22–32)
Calcium: 9.7 mg/dL (ref 8.9–10.3)
Creatinine, Ser: 0.67 mg/dL (ref 0.44–1.00)
GFR calc non Af Amer: >60 mL/min (ref >60)
Glucose, Bld: 108 mg/dL — ABNORMAL HIGH (ref 65–99)
Potassium: 3.4 mmol/L — ABNORMAL LOW (ref 3.5–5.1)
SODIUM: 137 mmol/L (ref 135–145)
Total Bilirubin: 1 mg/dL (ref 0.3–1.2)
Total Protein: 7.7 g/dL (ref 6.5–8.1)

## 2015-08-14 LAB — LIPASE, BLOOD: LIPASE: 17 U/L (ref 11–51)

## 2015-08-14 LAB — URINALYSIS, ROUTINE W REFLEX MICROSCOPIC
Bilirubin Urine: NEGATIVE
GLUCOSE, UA: NEGATIVE mg/dL
HGB URINE DIPSTICK: NEGATIVE
Ketones, ur: NEGATIVE mg/dL
Nitrite: NEGATIVE
Protein, ur: NEGATIVE mg/dL
pH: 5.5 (ref 5.0–8.0)

## 2015-08-14 LAB — URINE MICROSCOPIC-ADD ON

## 2015-08-14 LAB — CBC
HEMATOCRIT: 44 % (ref 36.0–46.0)
HEMOGLOBIN: 14.8 g/dL (ref 12.0–15.0)
MCH: 29 pg (ref 26.0–34.0)
MCHC: 33.6 g/dL (ref 30.0–36.0)
MCV: 86.1 fL (ref 78.0–100.0)
Platelets: 203 10*3/uL (ref 150–400)
RBC: 5.11 MIL/uL (ref 3.87–5.11)
RDW: 14.8 % (ref 11.5–15.5)
WBC: 7.6 10*3/uL (ref 4.0–10.5)

## 2015-08-14 LAB — MRSA PCR SCREENING: MRSA by PCR: NEGATIVE

## 2015-08-14 MED ORDER — PROMETHAZINE HCL 25 MG/ML IJ SOLN
6.2500 mg | Freq: Four times a day (QID) | INTRAMUSCULAR | Status: DC | PRN
  Administered 2015-08-14 – 2015-08-17 (×5): 6.25 mg via INTRAVENOUS
  Filled 2015-08-14 (×7): qty 1

## 2015-08-14 MED ORDER — FAMOTIDINE IN NACL 20-0.9 MG/50ML-% IV SOLN
20.0000 mg | Freq: Two times a day (BID) | INTRAVENOUS | Status: DC
  Administered 2015-08-14 – 2015-08-16 (×6): 20 mg via INTRAVENOUS
  Filled 2015-08-14 (×8): qty 50

## 2015-08-14 MED ORDER — DIATRIZOATE MEGLUMINE & SODIUM 66-10 % PO SOLN
90.0000 mL | Freq: Once | ORAL | Status: AC
  Administered 2015-08-14: 90 mL via NASOGASTRIC
  Filled 2015-08-14: qty 90

## 2015-08-14 MED ORDER — SODIUM CHLORIDE 0.9 % IV SOLN
INTRAVENOUS | Status: DC
  Administered 2015-08-14: 08:00:00 via INTRAVENOUS

## 2015-08-14 MED ORDER — LORAZEPAM 2 MG/ML IJ SOLN: 0.5000 mg | Freq: Four times a day (QID) | INTRAMUSCULAR | Status: DC | PRN

## 2015-08-14 MED ORDER — DICLOFENAC SODIUM 1 % TD GEL: 2.0000 g | Freq: Four times a day (QID) | TRANSDERMAL | Status: DC | PRN

## 2015-08-14 MED ORDER — LORAZEPAM 2 MG/ML IJ SOLN
1.0000 mg | Freq: Once | INTRAMUSCULAR | Status: AC
  Administered 2015-08-14: 1 mg via INTRAVENOUS
  Filled 2015-08-14: qty 1

## 2015-08-14 MED ORDER — PROMETHAZINE HCL 25 MG/ML IJ SOLN
25.0000 mg | Freq: Once | INTRAMUSCULAR | Status: AC
  Administered 2015-08-14: 25 mg via INTRAVENOUS
  Filled 2015-08-14: qty 1

## 2015-08-14 MED ORDER — GABAPENTIN 300 MG PO CAPS: 300.0000 mg | ORAL_CAPSULE | Freq: Three times a day (TID) | ORAL | Status: DC

## 2015-08-14 MED ORDER — HYDROMORPHONE HCL 1 MG/ML IJ SOLN
1.0000 mg | Freq: Once | INTRAMUSCULAR | Status: AC
  Administered 2015-08-14: 1 mg via INTRAVENOUS
  Filled 2015-08-14: qty 1

## 2015-08-14 MED ORDER — LORAZEPAM 0.5 MG PO TABS
0.5000 mg | ORAL_TABLET | Freq: Four times a day (QID) | ORAL | Status: DC | PRN
Start: 2015-08-14 — End: 2015-08-14

## 2015-08-14 MED ORDER — DIPHENHYDRAMINE HCL 50 MG/ML IJ SOLN
25.0000 mg | Freq: Once | INTRAMUSCULAR | Status: AC | PRN
  Administered 2015-08-14: 25 mg via INTRAVENOUS
  Filled 2015-08-14: qty 1

## 2015-08-14 MED ORDER — ACETAMINOPHEN 650 MG RE SUPP: 650.0000 mg | Freq: Four times a day (QID) | RECTAL | Status: DC | PRN

## 2015-08-14 MED ORDER — ACETAMINOPHEN 325 MG PO TABS: 650.0000 mg | ORAL_TABLET | Freq: Four times a day (QID) | ORAL | Status: DC | PRN

## 2015-08-14 MED ORDER — ENOXAPARIN SODIUM 40 MG/0.4ML ~~LOC~~ SOLN
40.0000 mg | SUBCUTANEOUS | Status: DC
  Administered 2015-08-14 – 2015-08-16 (×3): 40 mg via SUBCUTANEOUS
  Filled 2015-08-14 (×4): qty 0.4

## 2015-08-14 MED ORDER — TRAZODONE HCL 50 MG PO TABS
100.0000 mg | ORAL_TABLET | Freq: Every evening | ORAL | Status: DC | PRN
  Administered 2015-08-15: 100 mg via ORAL
  Filled 2015-08-14: qty 2

## 2015-08-14 MED ORDER — FENTANYL CITRATE (PF) 100 MCG/2ML IJ SOLN
12.5000 ug | INTRAMUSCULAR | Status: DC | PRN
  Administered 2015-08-14 – 2015-08-15 (×3): 25 ug via INTRAVENOUS
  Filled 2015-08-14 (×3): qty 2

## 2015-08-14 MED ORDER — MOMETASONE FURO-FORMOTEROL FUM 200-5 MCG/ACT IN AERO
2.0000 | INHALATION_SPRAY | Freq: Two times a day (BID) | RESPIRATORY_TRACT | Status: DC
  Administered 2015-08-16 – 2015-08-17 (×2): 2 via RESPIRATORY_TRACT
  Filled 2015-08-14: qty 8.8

## 2015-08-14 MED ORDER — PROMETHAZINE HCL 25 MG PO TABS
12.5000 mg | ORAL_TABLET | Freq: Four times a day (QID) | ORAL | Status: DC | PRN
  Administered 2015-08-14: 12.5 mg via ORAL
  Filled 2015-08-14: qty 1

## 2015-08-14 MED ORDER — POTASSIUM CHLORIDE 10 MEQ/100ML IV SOLN
10.0000 meq | INTRAVENOUS | Status: AC
  Administered 2015-08-14 (×3): 10 meq via INTRAVENOUS
  Filled 2015-08-14 (×3): qty 100

## 2015-08-14 MED ORDER — HYDROMORPHONE HCL 1 MG/ML IJ SOLN: 0.5000 mg | INTRAMUSCULAR | Status: DC | PRN

## 2015-08-14 MED ORDER — IOPAMIDOL (ISOVUE-300) INJECTION 61%
100.0000 mL | Freq: Once | INTRAVENOUS | Status: AC | PRN
  Administered 2015-08-14: 100 mL via INTRAVENOUS

## 2015-08-14 MED ORDER — ALBUTEROL SULFATE (2.5 MG/3ML) 0.083% IN NEBU: 3.0000 mL | INHALATION_SOLUTION | Freq: Four times a day (QID) | RESPIRATORY_TRACT | Status: DC | PRN

## 2015-08-14 MED ORDER — TIOTROPIUM BROMIDE MONOHYDRATE 18 MCG IN CAPS
18.0000 ug | ORAL_CAPSULE | Freq: Every day | RESPIRATORY_TRACT | Status: DC
  Administered 2015-08-17: 18 ug via RESPIRATORY_TRACT
  Filled 2015-08-14: qty 5

## 2015-08-14 MED ORDER — KCL IN DEXTROSE-NACL 20-5-0.45 MEQ/L-%-% IV SOLN
INTRAVENOUS | Status: DC
  Administered 2015-08-14 – 2015-08-16 (×5): via INTRAVENOUS
  Filled 2015-08-14 (×8): qty 1000

## 2015-08-14 NOTE — ED Notes (Signed)
MD at bedside. 

## 2015-08-14 NOTE — Progress Notes (Signed)
Gastrografin given - Radiology notified.

## 2015-08-14 NOTE — ED Notes (Signed)
Pt in ct 

## 2015-08-14 NOTE — Consult Note (Signed)
Reason for Consult: Small Bowel Obstruction  Referring Physician: Dr. Richardson Landry is an 55 y.o. female with a history of SBO (12/03/2014), hysterectomy and hernia repair that presented to the ED at [facility] on 08/13/2015 with 7/10 constant stabbing, epigastric and suprapubic abdominal pain.  Abdominal pain was associated with nausea, vomiting, diminished appetite and constipation. Last bowel movement was on Monday. Patient is passing gas. Patient states that she has not urinated since arriving to the ED last night. Chills last night. CT findings indicate functional discontinuity or a degree of obstruction. Prior SBO was managed non-surgically.   Past Medical History  Diagnosis Date  . COPD (chronic obstructive pulmonary disease) ([place])   . Hypertension   . GERD (gastroesophageal reflux disease)   . Cataract   . Hyperlipidemia   . Heart murmur   . Asthma   . Chronic bronchitis ([place])   . Insomnia   . History of blood transfusion 2003-2004    "related to bowel obstruction OR"  . Osteoarthritis     "lower back; both hips; right leg" (08/01/2014)  . Chronic lower back pain   . Depression     Past Surgical History  Procedure Laterality Date  . Abdominal hysterectomy  2003  . Bowel resection  2003; 2004    x2 with primary anastamosis  . Total knee arthroplasty Right 03/2012    [facility]  . Laparoscopic incisional / umbilical / ventral hernia repair  2012    [facility], Dr. Abran Richard  . Abdominal exploration surgery  2004    abdominal compartment syndrome  . Abdominal exploration surgery  2003    small bowel prolapse through vagina  . Tonsillectomy  1977  . Adenoidectomy  2002  . Hernia repair    . Joint replacement    . Combined hysteroscopy diagnostic / d&c  ~ 2003    Family History  Problem Relation Age of Onset  . Cancer Mother   . Heart attack Father   . Colon cancer Maternal Grandmother   . Colon cancer Paternal Grandmother     Social History:  reports that she has never  smoked. She has never used smokeless tobacco. She reports that she does not drink alcohol or use illicit drugs.  Allergies:  Allergies  Allergen Reactions  . Bee Venom Anaphylaxis and Itching  . Peanuts [Peanut Oil] Anaphylaxis and Itching  . Shellfish Allergy Anaphylaxis and Itching  . Aspirin Itching  . Corn-Containing Products Itching  . Ibuprofen Itching  . Latex Itching  . Morphine And Related Itching  . Zofran [Ondansetron Hcl] Itching and Rash    Scheduled Meds: . enoxaparin (LOVENOX) injection  40 mg Subcutaneous Q24H  . famotidine (PEPCID) IV  20 mg Intravenous Q12H  . mometasone-formoterol  2 puff Inhalation BID  . potassium chloride  10 mEq Intravenous Q1 Hr x 3  . tiotropium  18 mcg Inhalation Daily  . [DISCONTINUED] sodium chloride   Intravenous STAT   Continuous Infusions: . dextrose 5 % and 0.45 % NaCl with KCl 20 mEq/L 100 mL/hr at 08/14/15 0939   PRN Meds:.acetaminophen **OR** acetaminophen, albuterol, diclofenac sodium, HYDROmorphone (DILAUDID) injection, LORazepam, promethazine, traZODone   Results for orders placed or performed during the hospital encounter of 08/14/15 (from the past 48 hour(s))  Lipase, blood     Status: None   Collection Time: 08/14/15  2:08 AM  Result Value Ref Range   Lipase 17 11 - 51 U/L  Comprehensive metabolic panel     Status:  Abnormal   Collection Time: 08/14/15  2:08 AM  Result Value Ref Range   Sodium 137 135 - 145 mmol/L   Potassium 3.4 (L) 3.5 - 5.1 mmol/L   Chloride 104 101 - 111 mmol/L   CO2 23 22 - 32 mmol/L   Glucose, Bld 108 (H) 65 - 99 mg/dL   BUN 10 6 - 20 mg/dL   Creatinine, Ser 0.67 0.44 - 1.00 mg/dL   Calcium 9.7 8.9 - 10.3 mg/dL   Total Protein 7.7 6.5 - 8.1 g/dL   Albumin 4.5 3.5 - 5.0 g/dL   AST 22 15 - 41 U/L   ALT 17 14 - 54 U/L   Alkaline Phosphatase 71 38 - 126 U/L   Total Bilirubin 1.0 0.3 - 1.2 mg/dL   GFR calc non Af Amer >60 >60 mL/min   GFR calc Af Amer >60 >60 mL/min    Comment: (NOTE) The  eGFR has been calculated using the CKD EPI equation. This calculation has not been validated in all clinical situations. eGFR's persistently <60 mL/min signify possible Chronic Kidney Disease.    Anion gap 10 5 - 15  CBC     Status: None   Collection Time: 08/14/15  2:08 AM  Result Value Ref Range   WBC 7.6 4.0 - 10.5 K/uL   RBC 5.11 3.87 - 5.11 MIL/uL   Hemoglobin 14.8 12.0 - 15.0 g/dL   HCT 44.0 36.0 - 46.0 %   MCV 86.1 78.0 - 100.0 fL   MCH 29.0 26.0 - 34.0 pg   MCHC 33.6 30.0 - 36.0 g/dL   RDW 14.8 11.5 - 15.5 %   Platelets 203 150 - 400 K/uL    Ct Abdomen Pelvis W Contrast  08/14/2015  CLINICAL DATA:  55 year old female with abdominal pain. History of prior bowel resection. EXAM: CT ABDOMEN AND PELVIS WITH CONTRAST TECHNIQUE: Multidetector CT imaging of the abdomen and pelvis was performed using the standard protocol following bolus administration of intravenous contrast. CONTRAST:  137m ISOVUE-300 IOPAMIDOL (ISOVUE-300) INJECTION 61% COMPARISON:  Abdominal CT dated 12/03/2014 and radiograph dated 08/14/2015 FINDINGS: The visualized lung bases are clear. No intra-abdominal free air or free fluid. There are multiple stones within the gallbladder. No gallbladder wall thickening or pericholecystic fluid. Ultrasound may provide better evaluation of the gallbladder clinically indicated. The liver, pancreas, spleen, adrenal glands, kidneys, visualized ureters, and urinary bladder appear unremarkable. Hysterectomy. Evaluation of the bowel is limited in the absence of oral contrast. There is an air distended loop of bowel in the mid abdomen anteriorly measuring up to 10 cm in diameter. Similar findings were seen on the prior CT. There is fecalization of the portion of this loop of small bowel indicative of chronic stasis. There is mild thickening of the wall of this segment of small bowel, possibly chronic. This may be related to chronic functional dysmotility or represent a degree of obstruction.  Anastomotic suture noted adjacent to this dilated loop of bowel. There is abutment of loops of small bowel to the anterior peritoneal wall compatible with adhesions. There is moderate stool throughout the colon. The abdominal aorta and IVC appear unremarkable. No portal venous gas identified. There is no adenopathy. There is midline vertical anterior abdominal wall incisional scar. Degenerative changes of the spine. No acute fracture. IMPRESSION: Chronically dilated loop of small bowel in the mid abdomen may be related to functional discontinuity or a degree of obstruction. There is adhesions of the small bowel to the anterior peritoneal wall. Clinical correlation  is recommended. Evaluation of the bowel is limited in the absence of oral contrast. Cholelithiasis. Electronically Signed   By: Anner Crete M.D.   On: 08/14/2015 05:24   Dg Abd Acute W/chest  08/14/2015  CLINICAL DATA:  Subacute onset of abdominal distention and nausea. Initial encounter. EXAM: DG ABDOMEN ACUTE W/ 1V CHEST COMPARISON:  CT of the abdomen and pelvis from 12/03/2014, and chest radiograph performed 03/04/2015 FINDINGS: The lungs are well-aerated and clear. There is no evidence of focal opacification, pleural effusion or pneumothorax. The cardiomediastinal silhouette is within normal limits. There is diffuse distention of colonic loops, with air-fluid levels. This may reflect mild ileus. No free intra-abdominal air is identified on the provided upright view. No acute osseous abnormalities are seen; the sacroiliac joints are unremarkable in appearance. IMPRESSION: 1. Diffuse distention of colonic loops, with air-fluid levels. This may reflect mild ileus. No free intra-abdominal air seen. 2. No acute cardiopulmonary process seen. Electronically Signed   By: Garald Balding M.D.   On: 08/14/2015 03:30    Review of Systems  Constitutional: Positive for chills. Negative for fever, weight loss and diaphoresis.  Eyes: Negative for blurred  vision.  Respiratory: Negative for shortness of breath.   Cardiovascular: Negative for chest pain.  Gastrointestinal: Positive for nausea, abdominal pain and constipation.  Genitourinary: Negative for dysuria, urgency and frequency.  Neurological: Negative for dizziness and headaches.   Blood pressure 113/80, pulse 99, temperature 98.3 F (36.8 C), temperature source Oral, resp. rate 18, height _0  (1.575 m), weight 202 lb (91.627 kg), SpO2 95 %. Physical Exam  Constitutional: She is oriented to person, place, and time. She appears well-developed and well-nourished. No distress.  HENT:  Head: Normocephalic and atraumatic.  Neck: Normal range of motion.  Cardiovascular: Normal rate, regular rhythm, normal heart sounds and intact distal pulses.  Exam reveals no gallop and no friction rub.   No murmur heard. Respiratory: Effort normal and breath sounds normal. No respiratory distress. She has no wheezes. She has no rales. She exhibits no tenderness.  GI: Soft. She exhibits distension. There is tenderness. There is no guarding.  Most tender in the right and left upper quadrants and suprapubic region of abdomen.   Bowel sounds hypoactive in all four quadrants.   Neurological: She is alert and oriented to person, place, and time. No cranial nerve deficit.  Skin: Skin is warm and dry. She is not diaphoretic. No erythema.  Scars on abdomen consistent with prior abdominal surgeries.   Psychiatric: She has a normal mood and affect. Her behavior is normal. Judgment and thought content normal.    Assessment/Plan: SBO Likely due to adhesions from previous abdominal surgeries vs. Dysmotility. No indication for urgent surgery. Passing flatus. Abdomen soft but distended.  NG tube placed today. Patient requests Ativan to facilitate NG tube placement. Start SBO protocol  FEN: NPO, IV Fluids VTE: Lovenox Dispo: SBO Thank you for the consult. Will follow.  Conley Rolls Phillp Dolores PA-S (II) 08/14/2015,  10:09 AM

## 2015-08-14 NOTE — Care Management Note (Signed)
Case Management Note  Patient Details  Name: [name] MRN: [id number] Date of Birth: [birth year]  Subjective/Objective:  55 y/o f admitted w/SBO. From home.                  Action/Plan:d/c plan home.   Expected Discharge Date:                 Expected Discharge Plan:  Home/Self Care  In-House Referral:     Discharge planning Services  CM Consult  Post Acute Care Choice:    Choice offered to:     DME Arranged:    DME Agency:     HH Arranged:    HH Agency:     Status of Service:  In process, will continue to follow  If discussed at Long Length of Stay Meetings, dates discussed:    Additional Comments:  Lanier ClamMahabir, Myleah Cavendish, RN 08/14/2015, 1:04 PM

## 2015-08-14 NOTE — ED Notes (Signed)
Bed: WA17 Expected date:  Expected time:  Means of arrival:  Comments: 55 yo F  Abd pain

## 2015-08-14 NOTE — H&P (Addendum)
History and Physical    [name] YNW:[id number] DOB: 09-09-1960 DOA: 08/14/2015  PCP: Pcp Not In System  Patient coming from: Home  Chief Complaint: Abdominal pain   HPI: [name] is a 55 y.o. female with medical history significant of COPD, HTN, hysterectomy, bowel resection, SBO, who presents complaining of abdominal pain, epigastric and lower quadrant, sharp in quality, 9./10. It is associated with vomiting. symptoms started 3 days prior to admission. She has not been able to eat or keep medications down. She relates last bowel movement was 3 days prior to admission. She has been passing gas.  She denies dyspnea, chest pain, diarrhea.    ED Course: CT abdomen and pelvis ; chronically dilated loop of small bowel in the mid-abdomen, maybe functional discontinuity of obstruction, adhesion of small bowel to the anterior peritoneal wall. WBC normal, K at 3.4. LFT normal.   Review of Systems: As per HPI otherwise 10 point review of systems negative.    Past Medical History  Diagnosis Date  . COPD (chronic obstructive pulmonary disease) (HCC)   . Hypertension   . GERD (gastroesophageal reflux disease)   . Cataract   . Hyperlipidemia   . Heart murmur   . Asthma   . Chronic bronchitis (HCC)   . Insomnia   . History of blood transfusion 2003-2004    "related to bowel obstruction OR"  . Osteoarthritis     "lower back; both hips; right leg" (08/01/2014)  . Chronic lower back pain   . Depression     Past Surgical History  Procedure Laterality Date  . Abdominal hysterectomy  2003  . Bowel resection  2003; 2004    x2 with primary anastamosis  . Total knee arthroplasty Right 03/2012    [facility]  . Laparoscopic incisional / umbilical / ventral hernia repair  2012    [facility], Dr. Carolynn Sayers  . Abdominal exploration surgery  2004    abdominal compartment syndrome  . Abdominal exploration surgery  2003    small bowel prolapse through vagina  . Tonsillectomy  1977  . Adenoidectomy   2002  . Hernia repair    . Joint replacement    . Combined hysteroscopy diagnostic / d&c  ~ 2003   Social History:    reports that she has never smoked. She has never used smokeless tobacco. She reports that she does not drink alcohol or use illicit drugs.  Allergies  Allergen Reactions  . Bee Venom Anaphylaxis and Itching  . Peanuts [Peanut Oil] Anaphylaxis and Itching  . Shellfish Allergy Anaphylaxis and Itching  . Aspirin Itching  . Corn-Containing Products Itching  . Ibuprofen Itching  . Latex Itching  . Morphine And Related Itching  . Zofran [Ondansetron Hcl] Itching and Rash    Family History  Problem Relation Age of Onset  . Cancer Mother   . Heart attack Father   . Colon cancer Maternal Grandmother   . Colon cancer Paternal Grandmother      Prior to Admission medications   Medication Sig Start Date End Date Taking? Authorizing Provider  acetaminophen (TYLENOL) 500 MG tablet Take 500 mg by mouth every 6 (six) hours as needed for mild pain or moderate pain.   Yes Historical Provider, MD  albuterol (PROVENTIL HFA;VENTOLIN HFA) 108 (90 BASE) MCG/ACT inhaler Inhale 1 puff into the lungs every 6 (six) hours as needed for wheezing or shortness of breath (wheezing).    Yes Historical Provider, MD  amLODipine (NORVASC) 10 MG tablet Take 10  mg by mouth daily.   Yes Historical Provider, MD  diclofenac sodium (VOLTAREN) 1 % GEL Apply 2 g topically 4 (four) times daily as needed (knee pain).    Yes Historical Provider, MD  diphenhydrAMINE (BENADRYL) 25 MG tablet Take 25 mg by mouth every 6 (six) hours as needed for allergies (allergies).   Yes Historical Provider, MD  esomeprazole (NEXIUM) 40 MG capsule Take 40 mg by mouth daily at 12 noon.   Yes Historical Provider, MD  Fluticasone-Salmeterol (ADVAIR) 500-50 MCG/DOSE AEPB Inhale 1 puff into the lungs 2 (two) times daily.   Yes Historical Provider, MD  gabapentin (NEURONTIN) 300 MG capsule Take 300 mg by mouth 3 (three) times  daily.   Yes Historical Provider, MD  ipratropium (ATROVENT) 0.06 % nasal spray Place 2 sprays into the nose 4 (four) times daily. Patient taking differently: Place 2 sprays into the nose 4 (four) times daily as needed for rhinitis.  05/05/13  Yes Linna Hoff, MD  lisinopril (PRINIVIL,ZESTRIL) 20 MG tablet Take 20 mg by mouth daily.   Yes Historical Provider, MD  loratadine (CLARITIN) 10 MG tablet Take 10 mg by mouth daily.   Yes Historical Provider, MD  LORazepam (ATIVAN) 0.5 MG tablet Take 0.5 mg by mouth every 6 (six) hours as needed for anxiety (anxiety).    Yes Historical Provider, MD  meloxicam (MOBIC) 15 MG tablet Take 15 mg by mouth daily as needed for pain.  09/18/14  Yes Historical Provider, MD  methocarbamol (ROBAXIN) 500 MG tablet Take 1 tablet (500 mg total) by mouth 2 (two) times daily as needed (back pain). 03/04/15  Yes Serena Y Sam, PA-C  mometasone (NASONEX) 50 MCG/ACT nasal spray USE 1 PUFF IN THE NOSTRILS TWICE A DAY AS NEEDED FOR CONGESTION 07/14/14  Yes Historical Provider, MD  Multiple Vitamin (MULTIVITAMIN WITH MINERALS) TABS tablet Take 1 tablet by mouth daily.   Yes Historical Provider, MD  polyethylene glycol (MIRALAX / GLYCOLAX) packet Take 17 g by mouth daily as needed for mild constipation or moderate constipation (constipation).    Yes Historical Provider, MD  promethazine (PHENERGAN) 25 MG tablet Take 25 mg by mouth every 8 (eight) hours as needed for nausea or vomiting (nausea).  08/20/13  Yes Historical Provider, MD  Propylene Glycol (SYSTANE BALANCE OP) Place 1 drop into the right eye daily.    Yes Historical Provider, MD  rosuvastatin (CRESTOR) 10 MG tablet Take 10 mg by mouth daily.   Yes Historical Provider, MD  sertraline (ZOLOFT) 100 MG tablet Take 100 mg by mouth daily.   Yes Historical Provider, MD  tiotropium (SPIRIVA) 18 MCG inhalation capsule Place 18 mcg into inhaler and inhale daily.   Yes Historical Provider, MD  traMADol (ULTRAM) 50 MG tablet Take 1  tablet (50 mg total) by mouth every 6 (six) hours as needed. 06/02/15  Yes Samantha Tripp Dowless, PA-C  traZODone (DESYREL) 100 MG tablet Take 100 mg by mouth at bedtime as needed for sleep.  06/09/14  Yes Historical Provider, MD  triamterene-hydrochlorothiazide (MAXZIDE-25) 37.5-25 MG tablet Take 1 tablet by mouth daily. 11/06/14  Yes Historical Provider, MD  benzonatate (TESSALON) 100 MG capsule Take 1 capsule (100 mg total) by mouth 3 (three) times daily as needed for cough. Patient not taking: Reported on 08/14/2015 03/04/15   Ace Gins Sam, PA-C  bisacodyl (DULCOLAX) 10 MG suppository Place 1 suppository (10 mg total) rectally daily as needed for moderate constipation. Patient not taking: Reported on 08/14/2015 08/03/14   Germain Osgood  Abrol, MD  guaiFENesin (MUCINEX) 600 MG 12 hr tablet Take 1 tablet (600 mg total) by mouth 2 (two) times daily. Patient not taking: Reported on 08/14/2015 03/04/15   Carlene CoriaSerena Y Sam, PA-C    Physical Exam: Filed Vitals:   08/14/15 0400 08/14/15 0430 08/14/15 0530 08/14/15 0630  BP: 114/74 106/73 120/83 113/80  Pulse: 100 97 98 99  Temp:      TempSrc:      Resp: 18     Height:      Weight:      SpO2: 93% 93% 96% 95%      Constitutional: NAD, calm, comfortable Filed Vitals:   08/14/15 0400 08/14/15 0430 08/14/15 0530 08/14/15 0630  BP: 114/74 106/73 120/83 113/80  Pulse: 100 97 98 99  Temp:      TempSrc:      Resp: 18     Height:      Weight:      SpO2: 93% 93% 96% 95%   Eyes: PERRL, lids and conjunctivae normal ENMT: Mucous membranes are moist. Posterior pharynx clear of any exudate or lesions.Normal dentition.  Neck: normal, supple, no masses, no thyromegaly Respiratory: clear to auscultation bilaterally, no wheezing, no crackles. Normal respiratory effort. No accessory muscle use.  Cardiovascular: Regular rate and rhythm, no murmurs / rubs / gallops. No extremity edema. 2+ pedal pulses. No carotid bruits.  Abdomen: mild epigastric and lower quadrants  tenderness,  Midline scar from prior surgery,  no masses palpated. No hepatosplenomegaly. Bowel sounds positive.  Musculoskeletal: no clubbing / cyanosis. No joint deformity upper and lower extremities. Good ROM, no contractures. Normal muscle tone.  Skin: no rashes, lesions, ulcers. No induration Neurologic: CN 2-12 grossly intact. Sensation intact, DTR normal. Strength 5/5 in all 4.  Psychiatric: Normal judgment and insight. Alert and oriented x 3. Normal mood.     Labs on Admission: I have personally reviewed following labs and imaging studies  CBC:  Recent Labs Lab 08/14/15 0208  WBC 7.6  HGB 14.8  HCT 44.0  MCV 86.1  PLT 203   Basic Metabolic Panel:  Recent Labs Lab 08/14/15 0208  NA 137  K 3.4*  CL 104  CO2 23  GLUCOSE 108*  BUN 10  CREATININE 0.67  CALCIUM 9.7   GFR: Estimated Creatinine Clearance: 84.6 mL/min (by C-G formula based on Cr of 0.67). Liver Function Tests:  Recent Labs Lab 08/14/15 0208  AST 22  ALT 17  ALKPHOS 71  BILITOT 1.0  PROT 7.7  ALBUMIN 4.5    Recent Labs Lab 08/14/15 0208  LIPASE 17   No results for input(s): AMMONIA in the last 168 hours. Coagulation Profile: No results for input(s): INR, PROTIME in the last 168 hours. Cardiac Enzymes: No results for input(s): CKTOTAL, CKMB, CKMBINDEX, TROPONINI in the last 168 hours. BNP (last 3 results) No results for input(s): PROBNP in the last 8760 hours. HbA1C: No results for input(s): HGBA1C in the last 72 hours. CBG: No results for input(s): GLUCAP in the last 168 hours. Lipid Profile: No results for input(s): CHOL, HDL, LDLCALC, TRIG, CHOLHDL, LDLDIRECT in the last 72 hours. Thyroid Function Tests: No results for input(s): TSH, T4TOTAL, FREET4, T3FREE, THYROIDAB in the last 72 hours. Anemia Panel: No results for input(s): VITAMINB12, FOLATE, FERRITIN, TIBC, IRON, RETICCTPCT in the last 72 hours. Urine analysis:    Component Value Date/Time   COLORURINE YELLOW 07/31/2014  0000   APPEARANCEUR CLOUDY* 07/31/2014 0000   LABSPEC 1.022 07/31/2014 0000   PHURINE  5.5 07/31/2014 0000   GLUCOSEU NEGATIVE 07/31/2014 0000   HGBUR NEGATIVE 07/31/2014 0000   BILIRUBINUR NEGATIVE 07/31/2014 0000   KETONESUR NEGATIVE 07/31/2014 0000   PROTEINUR NEGATIVE 07/31/2014 0000   UROBILINOGEN 1.0 07/31/2014 0000   NITRITE NEGATIVE 07/31/2014 0000   LEUKOCYTESUR SMALL* 07/31/2014 0000   Sepsis Labs: !!!!!!!!!!!!!!!!!!!!!!!!!!!!!!!!!!!!!!!!!!!! (procalcitonin:4,lacticidven:4) )No results found for this or any previous visit (from the past 240 hour(s)).   Radiological Exams on Admission: Ct Abdomen Pelvis W Contrast  08/14/2015  CLINICAL DATA:  55 year old female with abdominal pain. History of prior bowel resection. EXAM: CT ABDOMEN AND PELVIS WITH CONTRAST TECHNIQUE: Multidetector CT imaging of the abdomen and pelvis was performed using the standard protocol following bolus administration of intravenous contrast. CONTRAST:  ISOVUE-300 IOPAMIDOL (ISOVUE-300) INJECTION 61% COMPARISON:  Abdominal CT dated 12/03/2014 and radiograph dated 08/14/2015 FINDINGS: The visualized lung bases are clear. No intra-abdominal free air or free fluid. There are multiple stones within the gallbladder. No gallbladder wall thickening or pericholecystic fluid. Ultrasound may provide better evaluation of the gallbladder clinically indicated. The liver, pancreas, spleen, adrenal glands, kidneys, visualized ureters, and urinary bladder appear unremarkable. Hysterectomy. Evaluation of the bowel is limited in the absence of oral contrast. There is an air distended loop of bowel in the mid abdomen anteriorly measuring up to 10 cm in diameter. Similar findings were seen on the prior CT. There is fecalization of the portion of this loop of small bowel indicative of chronic stasis. There is mild thickening of the wall of this segment of small bowel, possibly chronic. This may be related to chronic  functional dysmotility or represent a degree of obstruction. Anastomotic suture noted adjacent to this dilated loop of bowel. There is abutment of loops of small bowel to the anterior peritoneal wall compatible with adhesions. There is moderate stool throughout the colon. The abdominal aorta and IVC appear unremarkable. No portal venous gas identified. There is no adenopathy. There is midline vertical anterior abdominal wall incisional scar. Degenerative changes of the spine. No acute fracture. IMPRESSION: Chronically dilated loop of small bowel in the mid abdomen may be related to functional discontinuity or a degree of obstruction. There is adhesions of the small bowel to the anterior peritoneal wall. Clinical correlation is recommended. Evaluation of the bowel is limited in the absence of oral contrast. Cholelithiasis. Electronically Signed   By: Elgie Collard M.D.   On: 08/14/2015 05:24   Dg Abd Acute W/chest  08/14/2015  CLINICAL DATA:  Subacute onset of abdominal distention and nausea. Initial encounter. EXAM: DG ABDOMEN ACUTE W/ 1V CHEST COMPARISON:  CT of the abdomen and pelvis from 12/03/2014, and chest radiograph performed 03/04/2015 FINDINGS: The lungs are well-aerated and clear. There is no evidence of focal opacification, pleural effusion or pneumothorax. The cardiomediastinal silhouette is within normal limits. There is diffuse distention of colonic loops, with air-fluid levels. This may reflect mild ileus. No free intra-abdominal air is identified on the provided upright view. No acute osseous abnormalities are seen; the sacroiliac joints are unremarkable in appearance. IMPRESSION: 1. Diffuse distention of colonic loops, with air-fluid levels. This may reflect mild ileus. No free intra-abdominal air seen. 2. No acute cardiopulmonary process seen. Electronically Signed   By: Roanna Raider M.D.   On: 08/14/2015 03:30    EKG: will order EKG.   Assessment/Plan Active Problems:   COPD  (chronic obstructive pulmonary disease) (HCC)   GERD (gastroesophageal reflux disease)   SBO (small bowel obstruction) (HCC)   Hypokalemia  1-SBO,  Patient presents with abdominal pain, vomiting. CT abdomen with feature concerning for SBO.  Admit to med-surgery.  IV fluids fluids.  Replete potassium.  NPO. Surgery consulted.  Lat time she vomited was 3 days ago, complaining of nausea. Defer NG tube placement to Gen -surgery.  IV dilaudid PRN for pian.  IV Pepcid GI prophylaxis. Marland Kitchen.   2-Hypokalemia; replete IV Kcl 3 runs.   3-COPD; compensated.  Continue with albuterol PRN, spiriva, dulera substitute for Advair.   4-HTN; hold oral medications until able to tolerates oral. IV PRN hydralazine.   5-Anxiety, depression; continue with ativan PRN. Hold gabapentin and Zoloft until able to tolerates oral.    DVT prophylaxis: SCD< Lovenox.  Code Status: Full Code.  Family Communication: care discussed with patient.  Disposition Plan: Likely home in 2 to 3 days depending on clinical evolution.  Consults called: General surgery  Admission status: Observation.    Alba Coryegalado, Belkys A MD [facility] Pager [phone]  If 7PM-7AM, please contact night-coverage [email or web address] Password [email or web address]  08/14/2015, 8:18 AM

## 2015-08-14 NOTE — ED Provider Notes (Addendum)
CSN: [id number]     Arrival date & time 08/14/15  0134 History  By signing my name below, I, [name], attest that this documentation has been prepared under the direction and in the presence of Derwood Kaplan, MD. Electronically Signed: Phillis Strickland, ED Scribe. 08/14/2015. 6:30 AM.    Chief Complaint  Patient presents with  . Abdominal Pain   The history is provided by the patient. No language interpreter was used.  HPI Comments: [name] is a 55 y.o. Female with a hx of hysterectomy, hernia repair, COPD, bowel obstruction, and HTN brought in by EMS who presents to the Emergency Department complaining of gradually worsening, intermittent, epigastric and suprapubic abdominal pain onset 3 days ago. She reports associated constipation, decreased appetite, nausea, and vomiting. She has been passing gas, but it is painful. She is unable to tolerate food or liquids. She states that she was hospitalized the last time she had similar symptoms. Pt had phenergan about 2 hours ago. She denies fever, chills, or diarrhea.    Past Medical History  Diagnosis Date  . COPD (chronic obstructive pulmonary disease) (HCC)   . Hypertension   . GERD (gastroesophageal reflux disease)   . Cataract   . Hyperlipidemia   . Heart murmur   . Asthma   . Chronic bronchitis (HCC)   . Insomnia   . History of blood transfusion 2003-2004    "related to bowel obstruction OR"  . Osteoarthritis     "lower back; both hips; right leg" (08/01/2014)  . Chronic lower back pain   . Depression    Past Surgical History  Procedure Laterality Date  . Abdominal hysterectomy  2003  . Bowel resection  2003; 2004    x2 with primary anastamosis  . Total knee arthroplasty Right 03/2012    [facility]  . Laparoscopic incisional / umbilical / ventral hernia repair  2012    [facility], Dr. Carolynn Sayers  . Abdominal exploration surgery  2004    abdominal compartment syndrome  . Abdominal exploration surgery  2003    small bowel prolapse  through vagina  . Tonsillectomy  1977  . Adenoidectomy  2002  . Hernia repair    . Joint replacement    . Combined hysteroscopy diagnostic / d&c  ~ 2003   Family History  Problem Relation Age of Onset  . Cancer Mother   . Heart attack Father   . Colon cancer Maternal Grandmother   . Colon cancer Paternal Grandmother    Social History  Substance Use Topics  . Smoking status: Never Smoker   . Smokeless tobacco: Never Used  . Alcohol Use: No   OB History    No data available     Review of Systems 10 Systems reviewed and all are negative for acute change except as noted in the HPI.  Allergies  Bee venom; Peanuts; Shellfish allergy; Aspirin; Corn-containing products; Ibuprofen; Latex; Morphine and related; and Zofran  Home Medications   Prior to Admission medications   Medication Sig Start Date End Date Taking? Authorizing Provider  acetaminophen (TYLENOL) 500 MG tablet Take 500 mg by mouth every 6 (six) hours as needed for mild pain or moderate pain.   Yes Historical Provider, MD  albuterol (PROVENTIL HFA;VENTOLIN HFA) 108 (90 BASE) MCG/ACT inhaler Inhale 1 puff into the lungs every 6 (six) hours as needed for wheezing or shortness of breath (wheezing).    Yes Historical Provider, MD  amLODipine (NORVASC) 10 MG tablet Take 10 mg by mouth daily.  Yes Historical Provider, MD  diclofenac sodium (VOLTAREN) 1 % GEL Apply 2 g topically 4 (four) times daily as needed (knee pain).    Yes Historical Provider, MD  diphenhydrAMINE (BENADRYL) 25 MG tablet Take 25 mg by mouth every 6 (six) hours as needed for allergies (allergies).   Yes Historical Provider, MD  esomeprazole (NEXIUM) 40 MG capsule Take 40 mg by mouth daily at 12 noon.   Yes Historical Provider, MD  Fluticasone-Salmeterol (ADVAIR) 500-50 MCG/DOSE AEPB Inhale 1 puff into the lungs 2 (two) times daily.   Yes Historical Provider, MD  gabapentin (NEURONTIN) 300 MG capsule Take 300 mg by mouth 3 (three) times daily.   Yes  Historical Provider, MD  ipratropium (ATROVENT) 0.06 % nasal spray Place 2 sprays into the nose 4 (four) times daily. Patient taking differently: Place 2 sprays into the nose 4 (four) times daily as needed for rhinitis.  05/05/13  Yes Linna Hoff, MD  lisinopril (PRINIVIL,ZESTRIL) 20 MG tablet Take 20 mg by mouth daily.   Yes Historical Provider, MD  loratadine (CLARITIN) 10 MG tablet Take 10 mg by mouth daily.   Yes Historical Provider, MD  LORazepam (ATIVAN) 0.5 MG tablet Take 0.5 mg by mouth every 6 (six) hours as needed for anxiety (anxiety).    Yes Historical Provider, MD  meloxicam (MOBIC) 15 MG tablet Take 15 mg by mouth daily as needed for pain.  09/18/14  Yes Historical Provider, MD  methocarbamol (ROBAXIN) 500 MG tablet Take 1 tablet (500 mg total) by mouth 2 (two) times daily as needed (back pain). 03/04/15  Yes Serena Y Sam, PA-C  mometasone (NASONEX) 50 MCG/ACT nasal spray USE 1 PUFF IN THE NOSTRILS TWICE A DAY AS NEEDED FOR CONGESTION 07/14/14  Yes Historical Provider, MD  Multiple Vitamin (MULTIVITAMIN WITH MINERALS) TABS tablet Take 1 tablet by mouth daily.   Yes Historical Provider, MD  polyethylene glycol (MIRALAX / GLYCOLAX) packet Take 17 g by mouth daily as needed for mild constipation or moderate constipation (constipation).    Yes Historical Provider, MD  promethazine (PHENERGAN) 25 MG tablet Take 25 mg by mouth every 8 (eight) hours as needed for nausea or vomiting (nausea).  08/20/13  Yes Historical Provider, MD  Propylene Glycol (SYSTANE BALANCE OP) Place 1 drop into the right eye daily.    Yes Historical Provider, MD  rosuvastatin (CRESTOR) 10 MG tablet Take 10 mg by mouth daily.   Yes Historical Provider, MD  sertraline (ZOLOFT) 100 MG tablet Take 100 mg by mouth daily.   Yes Historical Provider, MD  tiotropium (SPIRIVA) 18 MCG inhalation capsule Place 18 mcg into inhaler and inhale daily.   Yes Historical Provider, MD  traMADol (ULTRAM) 50 MG tablet Take 1 tablet (50 mg  total) by mouth every 6 (six) hours as needed. 06/02/15  Yes Samantha Tripp Dowless, PA-C  traZODone (DESYREL) 100 MG tablet Take 100 mg by mouth at bedtime as needed for sleep.  06/09/14  Yes Historical Provider, MD  triamterene-hydrochlorothiazide (MAXZIDE-25) 37.5-25 MG tablet Take 1 tablet by mouth daily. 11/06/14  Yes Historical Provider, MD  benzonatate (TESSALON) 100 MG capsule Take 1 capsule (100 mg total) by mouth 3 (three) times daily as needed for cough. Patient not taking: Reported on 08/14/2015 03/04/15   Ace Gins Sam, PA-C  bisacodyl (DULCOLAX) 10 MG suppository Place 1 suppository (10 mg total) rectally daily as needed for moderate constipation. Patient not taking: Reported on 08/14/2015 08/03/14   [name], MD  guaiFENesin [facility]) 600  MG 12 hr tablet Take 1 tablet (600 mg total) by mouth 2 (two) times daily. Patient not taking: Reported on 08/14/2015 03/04/15   Ace GinsSerena Y Sam, PA-C   BP 120/83 mmHg  Pulse 98  Temp(Src) 98.3 F (36.8 C) (Oral)  Resp 18  Ht 5\' 2"  (1.575 m)  Wt 202 lb (91.627 kg)  BMI 36.94 kg/m2  SpO2 96% Physical Exam  Constitutional: She is oriented to person, place, and time. She appears well-developed and well-nourished.  HENT:  Head: Normocephalic and atraumatic.  Eyes: EOM are normal. Pupils are equal, round, and reactive to light.  Neck: Normal range of motion. Neck supple.  Cardiovascular: Regular rhythm and normal heart sounds.  Tachycardia present.  Exam reveals no gallop and no friction rub.   No murmur heard. Pulmonary/Chest: Effort normal and breath sounds normal. She has no wheezes.  Abdominal: Soft. She exhibits distension. Bowel sounds are decreased. There is generalized tenderness. There is guarding.  Musculoskeletal: Normal range of motion.  Neurological: She is alert and oriented to person, place, and time.  Skin: Skin is warm and dry.  Psychiatric: She has a normal mood and affect. Her behavior is normal.  Nursing note and vitals  reviewed.   ED Course  Procedures (including critical care time) DIAGNOSTIC STUDIES: Oxygen Saturation is 94% on RA, adequate by my interpretation.    COORDINATION OF CARE: 2:17 AM-Discussed treatment plan which includes labs with pt at bedside and pt agreed to plan.    Labs Review Labs Reviewed  COMPREHENSIVE METABOLIC PANEL - Abnormal; Notable for the following:    Potassium 3.4 (*)    Glucose, Bld 108 (*)    All other components within normal limits  LIPASE, BLOOD  CBC  URINALYSIS, ROUTINE W REFLEX MICROSCOPIC (NOT AT [facility])    Imaging Review Ct Abdomen Pelvis W Contrast  08/14/2015  CLINICAL DATA:  55 year old female with abdominal pain. History of prior bowel resection. EXAM: CT ABDOMEN AND PELVIS WITH CONTRAST TECHNIQUE: Multidetector CT imaging of the abdomen and pelvis was performed using the standard protocol following bolus administration of intravenous contrast. CONTRAST:  100mL ISOVUE-300 IOPAMIDOL (ISOVUE-300) INJECTION 61% COMPARISON:  Abdominal CT dated 12/03/2014 and radiograph dated 08/14/2015 FINDINGS: The visualized lung bases are clear. No intra-abdominal free air or free fluid. There are multiple stones within the gallbladder. No gallbladder wall thickening or pericholecystic fluid. Ultrasound may provide better evaluation of the gallbladder clinically indicated. The liver, pancreas, spleen, adrenal glands, kidneys, visualized ureters, and urinary bladder appear unremarkable. Hysterectomy. Evaluation of the bowel is limited in the absence of oral contrast. There is an air distended loop of bowel in the mid abdomen anteriorly measuring up to 10 cm in diameter. Similar findings were seen on the prior CT. There is fecalization of the portion of this loop of small bowel indicative of chronic stasis. There is mild thickening of the wall of this segment of small bowel, possibly chronic. This may be related to chronic functional dysmotility or represent a degree of obstruction.  Anastomotic suture noted adjacent to this dilated loop of bowel. There is abutment of loops of small bowel to the anterior peritoneal wall compatible with adhesions. There is moderate stool throughout the colon. The abdominal aorta and IVC appear unremarkable. No portal venous gas identified. There is no adenopathy. There is midline vertical anterior abdominal wall incisional scar. Degenerative changes of the spine. No acute fracture. IMPRESSION: Chronically dilated loop of small bowel in the mid abdomen may be related to functional discontinuity  or a degree of obstruction. There is adhesions of the small bowel to the anterior peritoneal wall. Clinical correlation is recommended. Evaluation of the bowel is limited in the absence of oral contrast. Cholelithiasis. Electronically Signed   By: Elgie CollardArash  Radparvar M.D.   On: 08/14/2015 05:24   Dg Abd Acute W/chest  08/14/2015  CLINICAL DATA:  Subacute onset of abdominal distention and nausea. Initial encounter. EXAM: DG ABDOMEN ACUTE W/ 1V CHEST COMPARISON:  CT of the abdomen and pelvis from 12/03/2014, and chest radiograph performed 03/04/2015 FINDINGS: The lungs are well-aerated and clear. There is no evidence of focal opacification, pleural effusion or pneumothorax. The cardiomediastinal silhouette is within normal limits. There is diffuse distention of colonic loops, with air-fluid levels. This may reflect mild ileus. No free intra-abdominal air is identified on the provided upright view. No acute osseous abnormalities are seen; the sacroiliac joints are unremarkable in appearance. IMPRESSION: 1. Diffuse distention of colonic loops, with air-fluid levels. This may reflect mild ileus. No free intra-abdominal air seen. 2. No acute cardiopulmonary process seen. Electronically Signed   By: Roanna RaiderJeffery  Chang M.D.   On: 08/14/2015 03:30   I have personally reviewed and evaluated these images and lab results as part of my medical decision-making.   EKG  Interpretation None      MDM   Final diagnoses:  SBO (small bowel obstruction) (HCC)    I personally performed the services described in this documentation, which was scribed in my presence. The recorded information has been reviewed and is accurate.  PT comes in with cc of nausea/emesis/abd pain. Pt has hx of SBO, and her symptoms are similar, last BM was 3 days ago. Pt is however passing flatus. Abd exam is non peritoneal. AAS ordered and is showing ileus. Spoke with Dr. Carolynne Edouardoth, who would prefer CT abdomen with contrast to get better morphology before treating this as SBO. Ct ordered.  Derwood KaplanAnkit Gusta Marksberry, MD 08/14/15 0444   6:30 AM CT abdomen is read as equivocal for SBO vs. Constipation. Oral contast not given as per new radiology protocol per CT tech. We will admit to medicine, as patient still has abd discomfort. Surgery to see the patient, and i will defer for their team to decide on NG tube, given the equivocal study and patient passing flatus.  Derwood KaplanAnkit Hollie Bartus, MD 08/14/15 [phone]

## 2015-08-14 NOTE — ED Notes (Addendum)
Pt BIB EMS from home c/o abdominal pain that began 3 days ago; pt has hx of bowel obstructions; last BM 4 days ago; N/V present; pt took phenergan about an hour and a half ago.

## 2015-08-14 NOTE — Progress Notes (Signed)
Carryover patient received from Dr. Rhunette CroftNanavati [name] is 55 year old lady that comes in with abdominal pain. Previous past medical history includes hernia repair, SBO, and hysterectomy. CT scan without oral contrast showing possible small bowel obstruction. Dr. Rhunette CroftNanavati consulted Dr. Carolynne Edouardoth of general surgery to see patient and to decided NG tube is necessary. Admitting patient to a MedSurg bed.

## 2015-08-14 NOTE — Care Management Obs Status (Signed)
MEDICARE OBSERVATION STATUS NOTIFICATION   Patient Details  Name: [name] MRN: [id number] Date of Birth: [birth year]   Medicare Observation Status Notification Given:  Yes    MahabirOlegario Messier, Stefani Baik, RN 08/14/2015, 1:02 PM

## 2015-08-14 NOTE — Progress Notes (Signed)
Chaplain responding to spiritual care consult - pt request.  Pt asleep on chaplain arrival.  Will follow up for assessment and support.    Belva CromeStalnaker, Leonora Gores Wayne MDiv

## 2015-08-15 DIAGNOSIS — E876 Hypokalemia: Secondary | ICD-10-CM

## 2015-08-15 DIAGNOSIS — K219 Gastro-esophageal reflux disease without esophagitis: Secondary | ICD-10-CM

## 2015-08-15 DIAGNOSIS — K5669 Other intestinal obstruction: Secondary | ICD-10-CM

## 2015-08-15 LAB — CBC
HCT: 42 % (ref 36.0–46.0)
Hemoglobin: 13.8 g/dL (ref 12.0–15.0)
MCH: 28.8 pg (ref 26.0–34.0)
MCHC: 32.9 g/dL (ref 30.0–36.0)
MCV: 87.7 fL (ref 78.0–100.0)
PLATELETS: 212 10*3/uL (ref 150–400)
RBC: 4.79 MIL/uL (ref 3.87–5.11)
RDW: 15.2 % (ref 11.5–15.5)
WBC: 4.5 10*3/uL (ref 4.0–10.5)

## 2015-08-15 LAB — BASIC METABOLIC PANEL
Anion gap: 7 (ref 5–15)
BUN: 10 mg/dL (ref 6–20)
CALCIUM: 8.8 mg/dL — AB (ref 8.9–10.3)
CHLORIDE: 106 mmol/L (ref 101–111)
CO2: 24 mmol/L (ref 22–32)
CREATININE: 0.65 mg/dL (ref 0.44–1.00)
Glucose, Bld: 98 mg/dL (ref 65–99)
Potassium: 4.1 mmol/L (ref 3.5–5.1)
SODIUM: 137 mmol/L (ref 135–145)

## 2015-08-15 NOTE — Progress Notes (Signed)
  Progress Note: General Surgery Service   Subjective: +BM overnight, pain much improved, still some nausea  Objective: Vital signs in last 24 hours: Temp:  [97.5 F (36.4 C)-98.4 F (36.9 C)] 97.5 F (36.4 C) (06/23 0518) Pulse Rate:  [79-96] 79 (06/23 0518) Resp:  [16-20] 20 (06/23 0518) BP: (112-126)/(73-88) 126/88 mmHg (06/23 0518) SpO2:  [96 %-100 %] 96 % (06/23 0518) Last BM Date: 08/11/15  Intake/Output from previous day: 06/22 0701 - 06/23 0700 In: 1325 [I.V.:1025; IV Piggyback:300] Out: 750 [Urine:400; Emesis/NG output:350] Intake/Output this shift:    Lungs: CTAB  Cardiovascular: RRR  Abd: soft, slight distension  Extremities: no edema  Neuro: AOx4  Lab Results: CBC   Recent Labs  08/14/15 0208 08/15/15 0531  WBC 7.6 4.5  HGB 14.8 13.8  HCT 44.0 42.0  PLT 203 212   BMET  Recent Labs  08/14/15 0208 08/15/15 0531  NA 137 137  K 3.4* 4.1  CL 104 106  CO2 23 24  GLUCOSE 108* 98  BUN 10 10  CREATININE 0.67 0.65  CALCIUM 9.7 8.8*   PT/INR No results for input(s): LABPROT, INR in the last 72 hours. ABG No results for input(s): PHART, HCO3 in the last 72 hours.  Invalid input(s): PCO2, PO2  Studies/Results:  Anti-infectives: Anti-infectives    None      Medications: Scheduled Meds: . enoxaparin (LOVENOX) injection  40 mg Subcutaneous Q24H  . famotidine (PEPCID) IV  20 mg Intravenous Q12H  . mometasone-formoterol  2 puff Inhalation BID  . tiotropium  18 mcg Inhalation Daily   Continuous Infusions: . dextrose 5 % and 0.45 % NaCl with KCl 20 mEq/L 100 mL/hr at 08/15/15 0610   PRN Meds:.acetaminophen **OR** acetaminophen, albuterol, diclofenac sodium, fentaNYL (SUBLIMAZE) injection, LORazepam, promethazine, traZODone  Assessment/Plan: Patient Active Problem List   Diagnosis Date Noted  . SBO (small bowel obstruction) (HCC) 08/14/2015  . Hypokalemia 08/14/2015  . Abdominal pain, lower   . Epigastric pain   . Partial small  bowel obstruction (HCC) 03/22/2014  . Abdominal pain 03/22/2014  . COPD (chronic obstructive pulmonary disease) (HCC)   . Hypertension   . GERD (gastroesophageal reflux disease)   . Hyperlipidemia   . Osteoarthritis    s/p  * No surgery found * SBO resolving -will recheck later today and then likely clamp and give clears -ambulate    LOS: 1 day   Rodman PickleLuke Aaron Breindel Collier, MD Pg# [phone] [facility]
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
## 2015-08-15 NOTE — Progress Notes (Signed)
PROGRESS NOTE    Zoe AyeMary G Strickland  ZOX:[id number]:[id number] DOB: [birth year] DOA: 08/14/2015 PCP: Pcp Not In System    Brief Narrative: 55 y/o with history of abdominal sugeries presenting with SBO.   Assessment & Plan:   SBO (small bowel obstruction) (HCC) - continue current medical regimen and bowel rest - general surgery on board.   Active Problems:   COPD (chronic obstructive pulmonary disease) (HCC) - on spiriva and albuterol prn. Stable    GERD (gastroesophageal reflux disease) - stable on pepcid     Hypokalemia - resolved after replacement  DVT prophylaxis: Lovenox Code Status: Full Family Communication: None at bedside discussed directly with patient Disposition Plan: Pending improvement in condition   Consultants:   General surgery   Procedures: None   Antimicrobials: None   Subjective: Patient has no new complaints  Objective: Filed Vitals:   08/14/15 1500 08/14/15 2150 08/15/15 0518 08/15/15 1427  BP: 115/80 112/73 126/88 125/72  Pulse: 96  79 89  Temp: 97.6 F (36.4 C) 98.4 F (36.9 C) 97.5 F (36.4 C) 97.6 F (36.4 C)  TempSrc: Oral Oral Oral Oral  Resp: 16 17 20 20   Height:      Weight:      SpO2: 100% 97% 96% 100%    Intake/Output Summary (Last 24 hours) at 08/15/15 1547 Last data filed at 08/15/15 0700  Gross per 24 hour  Intake    300 ml  Output    350 ml  Net    -50 ml   Filed Weights   08/14/15 0138  Weight: 91.627 kg (202 lb)    Examination:  General exam: Appears calm and comfortable  Respiratory system: Clear to auscultation. Respiratory effort normal. Cardiovascular system: S1 & S2 heard, RRR.  No rubs Gastrointestinal system: Abdomen is distended, NG tube in place, no guarding, hypoactive bowel sounds Central nervous system: Alert and oriented. No focal neurological deficits. Extremities: Symmetric 5 x 5 power. Skin: No rashes, lesions or ulcers on limited exam. Psychiatry: Judgement and insight appear normal. Mood &  affect appropriate.     Data Reviewed: I have personally reviewed following labs and imaging studies  CBC:  Recent Labs Lab 08/14/15 0208 08/15/15 0531  WBC 7.6 4.5  HGB 14.8 13.8  HCT 44.0 42.0  MCV 86.1 87.7  PLT 203 212   Basic Metabolic Panel:  Recent Labs Lab 08/14/15 0208 08/15/15 0531  NA 137 137  K 3.4* 4.1  CL 104 106  CO2 23 24  GLUCOSE 108* 98  BUN 10 10  CREATININE 0.67 0.65  CALCIUM 9.7 8.8*   GFR: Estimated Creatinine Clearance: 84.6 mL/min (by C-G formula based on Cr of 0.65). Liver Function Tests:  Recent Labs Lab 08/14/15 0208  AST 22  ALT 17  ALKPHOS 71  BILITOT 1.0  PROT 7.7  ALBUMIN 4.5    Recent Labs Lab 08/14/15 0208  LIPASE 17   No results for input(s): AMMONIA in the last 168 hours. Coagulation Profile: No results for input(s): INR, PROTIME in the last 168 hours. Cardiac Enzymes: No results for input(s): CKTOTAL, CKMB, CKMBINDEX, TROPONINI in the last 168 hours. BNP (last 3 results) No results for input(s): PROBNP in the last 8760 hours. HbA1C: No results for input(s): HGBA1C in the last 72 hours. CBG: No results for input(s): GLUCAP in the last 168 hours. Lipid Profile: No results for input(s): CHOL, HDL, LDLCALC, TRIG, CHOLHDL, LDLDIRECT in the last 72 hours. Thyroid Function Tests: No results for input(s): TSH, T4TOTAL,  FREET4, T3FREE, THYROIDAB in the last 72 hours. Anemia Panel: No results for input(s): VITAMINB12, FOLATE, FERRITIN, TIBC, IRON, RETICCTPCT in the last 72 hours. Sepsis Labs: No results for input(s): PROCALCITON, LATICACIDVEN in the last 168 hours.  Recent Results (from the past 240 hour(s))  MRSA PCR Screening     Status: None   Collection Time: 08/14/15  3:32 PM  Result Value Ref Range Status   MRSA by PCR NEGATIVE NEGATIVE Final    Comment:        The GeneXpert MRSA Assay (FDA approved for NASAL specimens only), is one component of a comprehensive MRSA colonization surveillance program. It  is not intended to diagnose MRSA infection nor to guide or monitor treatment for MRSA infections.          Radiology Studies: Ct Abdomen Pelvis W Contrast  08/14/2015  CLINICAL DATA:  55 year old female with abdominal pain. History of prior bowel resection. EXAM: CT ABDOMEN AND PELVIS WITH CONTRAST TECHNIQUE: Multidetector CT imaging of the abdomen and pelvis was performed using the standard protocol following bolus administration of intravenous contrast. CONTRAST:  100mL ISOVUE-300 IOPAMIDOL (ISOVUE-300) INJECTION 61% COMPARISON:  Abdominal CT dated 12/03/2014 and radiograph dated 08/14/2015 FINDINGS: The visualized lung bases are clear. No intra-abdominal free air or free fluid. There are multiple stones within the gallbladder. No gallbladder wall thickening or pericholecystic fluid. Ultrasound may provide better evaluation of the gallbladder clinically indicated. The liver, pancreas, spleen, adrenal glands, kidneys, visualized ureters, and urinary bladder appear unremarkable. Hysterectomy. Evaluation of the bowel is limited in the absence of oral contrast. There is an air distended loop of bowel in the mid abdomen anteriorly measuring up to 10 cm in diameter. Similar findings were seen on the prior CT. There is fecalization of the portion of this loop of small bowel indicative of chronic stasis. There is mild thickening of the wall of this segment of small bowel, possibly chronic. This may be related to chronic functional dysmotility or represent a degree of obstruction. Anastomotic suture noted adjacent to this dilated loop of bowel. There is abutment of loops of small bowel to the anterior peritoneal wall compatible with adhesions. There is moderate stool throughout the colon. The abdominal aorta and IVC appear unremarkable. No portal venous gas identified. There is no adenopathy. There is midline vertical anterior abdominal wall incisional scar. Degenerative changes of the spine. No acute  fracture. IMPRESSION: Chronically dilated loop of small bowel in the mid abdomen may be related to functional discontinuity or a degree of obstruction. There is adhesions of the small bowel to the anterior peritoneal wall. Clinical correlation is recommended. Evaluation of the bowel is limited in the absence of oral contrast. Cholelithiasis. Electronically Signed   By: Elgie CollardArash  Radparvar M.D.   On: 08/14/2015 05:24   Dg Abd Acute W/chest  08/14/2015  CLINICAL DATA:  Subacute onset of abdominal distention and nausea. Initial encounter. EXAM: DG ABDOMEN ACUTE W/ 1V CHEST COMPARISON:  CT of the abdomen and pelvis from 12/03/2014, and chest radiograph performed 03/04/2015 FINDINGS: The lungs are well-aerated and clear. There is no evidence of focal opacification, pleural effusion or pneumothorax. The cardiomediastinal silhouette is within normal limits. There is diffuse distention of colonic loops, with air-fluid levels. This may reflect mild ileus. No free intra-abdominal air is identified on the provided upright view. No acute osseous abnormalities are seen; the sacroiliac joints are unremarkable in appearance. IMPRESSION: 1. Diffuse distention of colonic loops, with air-fluid levels. This may reflect mild ileus. No free intra-abdominal  air seen. 2. No acute cardiopulmonary process seen. Electronically Signed   By: Roanna Raider M.D.   On: 08/14/2015 03:30   Dg Abd Portable 1v-small Bowel Obstruction Protocol-initial, 8 Hr Delay  08/15/2015  CLINICAL DATA:  8 hour delayed images for assessing small bowel obstruction. EXAM: PORTABLE ABDOMEN - 1 VIEW COMPARISON:  Abdominal radiograph performed earlier today at 12:38 p.m. FINDINGS: Contrast has progressed to the colon, without evidence for bowel obstruction. The distended small bowel loop at the mid abdomen is again seen. Contrast is noted within the bladder. No free intra-abdominal air is seen, though evaluation for free air is limited on supine views. No acute  osseous abnormalities are identified. An enteric tube is noted ending overlying the antrum of the stomach. IMPRESSION: Contrast has progressed to the colon. No evidence for bowel obstruction. Distended small bowel loop at the mid abdomen is grossly unchanged in appearance. No free intra-abdominal air seen. Electronically Signed   By: Roanna Raider M.D.   On: 08/15/2015 01:37   Dg Abd Portable 1v-small Bowel Protocol-position Verification  08/14/2015  CLINICAL DATA:  Enteric tube placement EXAM: PORTABLE ABDOMEN - 1 VIEW COMPARISON:  08/14/2015 abdominal radiograph FINDINGS: Enteric tube terminates in the gastric fundus. Prominently dilated featureless small bowel loop in the right abdomen is unchanged. Persistent diffuse mild to moderate distention of the visualized upper colon with gas and stool. No evidence of pneumatosis or pneumoperitoneum. Clear lung bases. IMPRESSION: Enteric tube terminates in the gastric fundus. Persistent prominently dilated featureless small bowel loop in the right abdomen, cannot exclude distal small bowel obstruction. Electronically Signed   By: Delbert Phenix M.D.   On: 08/14/2015 14:02        Scheduled Meds: . enoxaparin (LOVENOX) injection  40 mg Subcutaneous Q24H  . famotidine (PEPCID) IV  20 mg Intravenous Q12H  . mometasone-formoterol  2 puff Inhalation BID  . tiotropium  18 mcg Inhalation Daily   Continuous Infusions: . dextrose 5 % and 0.45 % NaCl with KCl 20 mEq/L 100 mL/hr at 08/15/15 0610     LOS: 1 day    Time spent: > 20 minutes    Penny Pia, MD [facility] Pager [phone]  If 7PM-7AM, please contact night-coverage [email or web address] Password [facility] 08/15/2015, 3:47 PM

## 2015-08-15 NOTE — Progress Notes (Signed)
Clamped NG tube at 1530. Pt ate jello & popsicle. Pt c/o pain 8/10 without nausea. Checked residual - less than 50ml noted. Flushed NG tube with air per order.

## 2015-08-16 NOTE — Progress Notes (Signed)
  Progress Note: General Surgery Service   Subjective: +BM, pain nearly completely resolved, no nausea  Objective: Vital signs in last 24 hours: Temp:  [97.6 F (36.4 C)-97.9 F (36.6 C)] 97.9 F (36.6 C) (06/24 0511) Pulse Rate:  [72-89] 72 (06/24 0511) Resp:  [20] 20 (06/24 0511) BP: (119-143)/(72-89) 143/80 mmHg (06/24 0511) SpO2:  [100 %] 100 % (06/24 0511) Last BM Date: 08/11/15  Intake/Output from previous day: 06/23 0701 - 06/24 0700 In: 240 [P.O.:240] Out: 230 [Emesis/NG output:230] Intake/Output this shift:    Lungs: CTAB  Cardiovascular: RRR  Abd: soft, NT, ND  Extremities: no edema  Neuro: AOx4  Lab Results: CBC   Recent Labs  08/14/15 0208 08/15/15 0531  WBC 7.6 4.5  HGB 14.8 13.8  HCT 44.0 42.0  PLT 203 212   BMET  Recent Labs  08/14/15 0208 08/15/15 0531  NA 137 137  K 3.4* 4.1  CL 104 106  CO2 23 24  GLUCOSE 108* 98  BUN 10 10  CREATININE 0.67 0.65  CALCIUM 9.7 8.8*   PT/INR No results for input(s): LABPROT, INR in the last 72 hours. ABG No results for input(s): PHART, HCO3 in the last 72 hours.  Invalid input(s): PCO2, PO2  Studies/Results:  Anti-infectives: Anti-infectives    None      Medications: Scheduled Meds: . enoxaparin (LOVENOX) injection  40 mg Subcutaneous Q24H  . famotidine (PEPCID) IV  20 mg Intravenous Q12H  . mometasone-formoterol  2 puff Inhalation BID  . tiotropium  18 mcg Inhalation Daily   Continuous Infusions: . dextrose 5 % and 0.45 % NaCl with KCl 20 mEq/L 100 mL/hr at 08/16/15 0328   PRN Meds:.acetaminophen **OR** acetaminophen, albuterol, diclofenac sodium, fentaNYL (SUBLIMAZE) injection, LORazepam, promethazine, traZODone  Assessment/Plan: Patient Active Problem List   Diagnosis Date Noted  . SBO (small bowel obstruction) (HCC) 08/14/2015  . Hypokalemia 08/14/2015  . Abdominal pain, lower   . Epigastric pain   . Partial small bowel obstruction (HCC) 03/22/2014  . Abdominal pain  03/22/2014  . COPD (chronic obstructive pulmonary disease) (HCC)   . Hypertension   . GERD (gastroesophageal reflux disease)   . Hyperlipidemia   . Osteoarthritis    s/p  * No surgery found * SBO resolved -dc ng -advance diet -if tolerates soft diet then can go home   LOS: 2 days   Rodman PickleLuke Aaron Kinsinger, MD Pg# [phone] [facility]

## 2015-08-16 NOTE — Progress Notes (Signed)
PROGRESS NOTE    Zoe PerkingMary G Strickland  JYN:[id number] DOB: 1960/04/05 DOA: 08/14/2015 PCP: Pcp Not In System    Brief Narrative: 55 y/o with history of abdominal surgeries presenting with SBO.  Assessment & Plan:   SBO (small bowel obstruction) (HCC) - Gen. surgery assisting. Plan reportedly is to remove NG tube and allow patient to eat. If patient tolerates by mouth will discontinue maintenance IV fluids. - Will monitor and if no problems will plan on discharging next a.m.  Active Problems:   COPD (chronic obstructive pulmonary disease) (HCC) - on spiriva and albuterol prn. Stable    GERD (gastroesophageal reflux disease) - stable on pepcid     Hypokalemia - resolved after replacement  DVT prophylaxis: Lovenox Code Status: Full Family Communication: None at bedside discussed directly with patient Disposition Plan: Pending improvement in condition   Consultants:   General surgery   Procedures: None   Antimicrobials: None   Subjective: Patient has no new complaints, no acute issues overnight  Objective: Filed Vitals:   08/15/15 1427 08/15/15 2053 08/16/15 0511 08/16/15 1400  BP: 125/72 119/89 143/80 122/77  Pulse: 89 84 72 87  Temp: 97.6 F (36.4 C) 97.6 F (36.4 C) 97.9 F (36.6 C) 97.8 F (36.6 C)  TempSrc: Oral Oral Oral Oral  Resp: 20 20 20 18   Height:      Weight:      SpO2: 100% 100% 100% 100%    Intake/Output Summary (Last 24 hours) at 08/16/15 1528 Last data filed at 08/16/15 1200  Gross per 24 hour  Intake    640 ml  Output    230 ml  Net    410 ml   Filed Weights   08/14/15 0138  Weight: 91.627 kg (202 lb)    Examination:  General exam: Appears calm and comfortable  Respiratory system: Clear to auscultation. Respiratory effort normal. Cardiovascular system: S1 & S2 heard, RRR.  No rubs Gastrointestinal system: Abdomen is distended, NG tube in place, no guarding, hypoactive bowel sounds Central nervous system: Alert and oriented. No  focal neurological deficits. Extremities: Symmetric 5 x 5 power. Skin: No rashes, lesions or ulcers on limited exam. Psychiatry: Judgement and insight appear normal. Mood & affect appropriate.     Data Reviewed: I have personally reviewed following labs and imaging studies  CBC:  Recent Labs Lab 08/14/15 0208 08/15/15 0531  WBC 7.6 4.5  HGB 14.8 13.8  HCT 44.0 42.0  MCV 86.1 87.7  PLT 203 212   Basic Metabolic Panel:  Recent Labs Lab 08/14/15 0208 08/15/15 0531  NA 137 137  K 3.4* 4.1  CL 104 106  CO2 23 24  GLUCOSE 108* 98  BUN 10 10  CREATININE 0.67 0.65  CALCIUM 9.7 8.8*   GFR: Estimated Creatinine Clearance: 84.6 mL/min (by C-G formula based on Cr of 0.65). Liver Function Tests:  Recent Labs Lab 08/14/15 0208  AST 22  ALT 17  ALKPHOS 71  BILITOT 1.0  PROT 7.7  ALBUMIN 4.5    Recent Labs Lab 08/14/15 0208  LIPASE 17   No results for input(s): AMMONIA in the last 168 hours. Coagulation Profile: No results for input(s): INR, PROTIME in the last 168 hours. Cardiac Enzymes: No results for input(s): CKTOTAL, CKMB, CKMBINDEX, TROPONINI in the last 168 hours. BNP (last 3 results) No results for input(s): PROBNP in the last 8760 hours. HbA1C: No results for input(s): HGBA1C in the last 72 hours. CBG: No results for input(s): GLUCAP in the last  168 hours. Lipid Profile: No results for input(s): CHOL, HDL, LDLCALC, TRIG, CHOLHDL, LDLDIRECT in the last 72 hours. Thyroid Function Tests: No results for input(s): TSH, T4TOTAL, FREET4, T3FREE, THYROIDAB in the last 72 hours. Anemia Panel: No results for input(s): VITAMINB12, FOLATE, FERRITIN, TIBC, IRON, RETICCTPCT in the last 72 hours. Sepsis Labs: No results for input(s): PROCALCITON, LATICACIDVEN in the last 168 hours.  Recent Results (from the past 240 hour(s))  MRSA PCR Screening     Status: None   Collection Time: 08/14/15  3:32 PM  Result Value Ref Range Status   MRSA by PCR NEGATIVE NEGATIVE  Final    Comment:        The GeneXpert MRSA Assay (FDA approved for NASAL specimens only), is one component of a comprehensive MRSA colonization surveillance program. It is not intended to diagnose MRSA infection nor to guide or monitor treatment for MRSA infections.          Radiology Studies: Dg Abd Portable 1v-small Bowel Obstruction Protocol-initial, 8 Hr Delay  08/15/2015  CLINICAL DATA:  8 hour delayed images for assessing small bowel obstruction. EXAM: PORTABLE ABDOMEN - 1 VIEW COMPARISON:  Abdominal radiograph performed earlier today at 12:38 p.m. FINDINGS: Contrast has progressed to the colon, without evidence for bowel obstruction. The distended small bowel loop at the mid abdomen is again seen. Contrast is noted within the bladder. No free intra-abdominal air is seen, though evaluation for free air is limited on supine views. No acute osseous abnormalities are identified. An enteric tube is noted ending overlying the antrum of the stomach. IMPRESSION: Contrast has progressed to the colon. No evidence for bowel obstruction. Distended small bowel loop at the mid abdomen is grossly unchanged in appearance. No free intra-abdominal air seen. Electronically Signed   By: Roanna RaiderJeffery  Chang M.D.   On: 08/15/2015 01:37        Scheduled Meds: . enoxaparin (LOVENOX) injection  40 mg Subcutaneous Q24H  . famotidine (PEPCID) IV  20 mg Intravenous Q12H  . mometasone-formoterol  2 puff Inhalation BID  . tiotropium  18 mcg Inhalation Daily   Continuous Infusions: . dextrose 5 % and 0.45 % NaCl with KCl 20 mEq/L 100 mL/hr at 08/16/15 0328     LOS: 2 days    Time spent: > 20 minutes   Penny PiaVEGA, Dhyan Noah, MD [facility] Pager [phone]  If 7PM-7AM, please contact night-coverage [email or web address] Password [email or web address] 08/16/2015, 3:28 PM

## 2015-08-17 MED ORDER — FAMOTIDINE 20 MG PO TABS
20.0000 mg | ORAL_TABLET | Freq: Two times a day (BID) | ORAL | Status: DC
  Administered 2015-08-17: 20 mg via ORAL
  Filled 2015-08-17 (×2): qty 1

## 2015-08-17 NOTE — Progress Notes (Signed)
Patient discharged.  Notified of discharge instructions, follow-up appointments, and medications.  Notified of signs and symptoms of obstruction and when to call a doctor.  Patient stated understanding, AVS signed.  IV removed, belongings gathered.  No questions or concerns at this time.  Escorted out via wheelchair to ride with NT.

## 2015-08-17 NOTE — Discharge Summary (Signed)
Physician Discharge Summary  [name] ZOX:[id number] DOB: 1960-08-20 DOA: 08/14/2015  PCP: Pcp Not In System  Admit date: 08/14/2015 Discharge date: 08/17/2015  Time spent: > 35 minutes   Discharge Diagnoses:  Active Problems:   COPD (chronic obstructive pulmonary disease) (HCC)   GERD (gastroesophageal reflux disease)   SBO (small bowel obstruction) (HCC)   Hypokalemia   Discharge Condition: stable  Diet recommendation: soft diet  Filed Weights   08/14/15 0138 08/17/15 0547  Weight: 91.627 kg (202 lb) 94.53 kg (208 lb 6.4 oz)    History of present illness:  55 y.o. female with medical history significant of COPD, HTN, hysterectomy, bowel resection, SBO, who presents complaining of abdominal pain, epigastric and lower quadrant, sharp in quality, 9./10.Patient diagnosed with SBO.  Hospital Course:  SBO - Improved after NG tube placement and bowel rest. -Resolved patient tolerating by mouth intake.  For known medical conditions listed above will continue home medication regimen listed below  Procedures:  None  Consultations:  General surgery  Discharge Exam: Filed Vitals:   08/16/15 2302 08/17/15 0644  BP: 130/75 109/54  Pulse: 84 72  Temp: 98 F (36.7 C) 98 F (36.7 C)  Resp: 16 16    General: Patient in no acute distress, alert and awake Cardiovascular: S1 and S2 within normal limits no rubs Respiratory: No increased work of breathing, no wheezes, equal chest rise  Discharge Instructions   Discharge Instructions    Call MD for:  severe uncontrolled pain    Complete by:  As directed      Diet - low sodium heart healthy    Complete by:  As directed      Discharge instructions    Complete by:  As directed   Please follow-up with your primary care physician as needed.     Increase activity slowly    Complete by:  As directed           Current Discharge Medication List    CONTINUE these medications which have NOT CHANGED   Details   acetaminophen (TYLENOL) 500 MG tablet Take 500 mg by mouth every 6 (six) hours as needed for mild pain or moderate pain.    albuterol (PROVENTIL HFA;VENTOLIN HFA) 108 (90 BASE) MCG/ACT inhaler Inhale 1 puff into the lungs every 6 (six) hours as needed for wheezing or shortness of breath (wheezing).     amLODipine (NORVASC) 10 MG tablet Take 10 mg by mouth daily.    diclofenac sodium (VOLTAREN) 1 % GEL Apply 2 g topically 4 (four) times daily as needed (knee pain).     diphenhydrAMINE (BENADRYL) 25 MG tablet Take 25 mg by mouth every 6 (six) hours as needed for allergies (allergies).    esomeprazole (NEXIUM) 40 MG capsule Take 40 mg by mouth daily at 12 noon.    Fluticasone-Salmeterol (ADVAIR) 500-50 MCG/DOSE AEPB Inhale 1 puff into the lungs 2 (two) times daily.    gabapentin (NEURONTIN) 300 MG capsule Take 300 mg by mouth 3 (three) times daily.    ipratropium (ATROVENT) 0.06 % nasal spray Place 2 sprays into the nose 4 (four) times daily. Qty: 15 mL, Refills: 12    lisinopril (PRINIVIL,ZESTRIL) 20 MG tablet Take 20 mg by mouth daily.    loratadine (CLARITIN) 10 MG tablet Take 10 mg by mouth daily.    LORazepam (ATIVAN) 0.5 MG tablet Take 0.5 mg by mouth every 6 (six) hours as needed for anxiety (anxiety).     meloxicam (MOBIC) 15 MG  tablet Take 15 mg by mouth daily as needed for pain.  Refills: 3    methocarbamol (ROBAXIN) 500 MG tablet Take 1 tablet (500 mg total) by mouth 2 (two) times daily as needed (back pain). Qty: 20 tablet, Refills: 0    mometasone (NASONEX) 50 MCG/ACT nasal spray USE 1 PUFF IN THE NOSTRILS TWICE A DAY AS NEEDED FOR CONGESTION Refills: 0    Multiple Vitamin (MULTIVITAMIN WITH MINERALS) TABS tablet Take 1 tablet by mouth daily.    polyethylene glycol (MIRALAX / GLYCOLAX) packet Take 17 g by mouth daily as needed for mild constipation or moderate constipation (constipation).     promethazine (PHENERGAN) 25 MG tablet Take 25 mg by mouth every 8 (eight)  hours as needed for nausea or vomiting (nausea).     Propylene Glycol (SYSTANE BALANCE OP) Place 1 drop into the right eye daily.     rosuvastatin (CRESTOR) 10 MG tablet Take 10 mg by mouth daily.    sertraline (ZOLOFT) 100 MG tablet Take 100 mg by mouth daily.    tiotropium (SPIRIVA) 18 MCG inhalation capsule Place 18 mcg into inhaler and inhale daily.    traMADol (ULTRAM) 50 MG tablet Take 1 tablet (50 mg total) by mouth every 6 (six) hours as needed. Qty: 30 tablet, Refills: 0    traZODone (DESYREL) 100 MG tablet Take 100 mg by mouth at bedtime as needed for sleep.  Refills: 1    triamterene-hydrochlorothiazide (MAXZIDE-25) 37.5-25 MG tablet Take 1 tablet by mouth daily. Refills: 3    bisacodyl (DULCOLAX) 10 MG suppository Place 1 suppository (10 mg total) rectally daily as needed for moderate constipation. Qty: 12 suppository, Refills: 0       Allergies  Allergen Reactions  . Bee Venom Anaphylaxis and Itching  . Peanuts [Peanut Oil] Anaphylaxis and Itching  . Shellfish Allergy Anaphylaxis and Itching  . Aspirin Itching  . Corn-Containing Products Itching  . Ibuprofen Itching  . Latex Itching  . Morphine And Related Itching  . Zofran [Ondansetron Hcl] Itching and Rash      The results of significant diagnostics from this hospitalization (including imaging, microbiology, ancillary and laboratory) are listed below for reference.    Significant Diagnostic Studies: Ct Abdomen Pelvis W Contrast  08/14/2015  CLINICAL DATA:  55 year old female with abdominal pain. History of prior bowel resection. EXAM: CT ABDOMEN AND PELVIS WITH CONTRAST TECHNIQUE: Multidetector CT imaging of the abdomen and pelvis was performed using the standard protocol following bolus administration of intravenous contrast. CONTRAST:  100mL ISOVUE-300 IOPAMIDOL (ISOVUE-300) INJECTION 61% COMPARISON:  Abdominal CT dated 12/03/2014 and radiograph dated 08/14/2015 FINDINGS: The visualized lung bases are  clear. No intra-abdominal free air or free fluid. There are multiple stones within the gallbladder. No gallbladder wall thickening or pericholecystic fluid. Ultrasound may provide better evaluation of the gallbladder clinically indicated. The liver, pancreas, spleen, adrenal glands, kidneys, visualized ureters, and urinary bladder appear unremarkable. Hysterectomy. Evaluation of the bowel is limited in the absence of oral contrast. There is an air distended loop of bowel in the mid abdomen anteriorly measuring up to 10 cm in diameter. Similar findings were seen on the prior CT. There is fecalization of the portion of this loop of small bowel indicative of chronic stasis. There is mild thickening of the wall of this segment of small bowel, possibly chronic. This may be related to chronic functional dysmotility or represent a degree of obstruction. Anastomotic suture noted adjacent to this dilated loop of bowel. There is abutment of  loops of small bowel to the anterior peritoneal wall compatible with adhesions. There is moderate stool throughout the colon. The abdominal aorta and IVC appear unremarkable. No portal venous gas identified. There is no adenopathy. There is midline vertical anterior abdominal wall incisional scar. Degenerative changes of the spine. No acute fracture. IMPRESSION: Chronically dilated loop of small bowel in the mid abdomen may be related to functional discontinuity or a degree of obstruction. There is adhesions of the small bowel to the anterior peritoneal wall. Clinical correlation is recommended. Evaluation of the bowel is limited in the absence of oral contrast. Cholelithiasis. Electronically Signed   By: Elgie CollardArash  Radparvar M.D.   On: 08/14/2015 05:24   Dg Abd Acute W/chest  08/14/2015  CLINICAL DATA:  Subacute onset of abdominal distention and nausea. Initial encounter. EXAM: DG ABDOMEN ACUTE W/ 1V CHEST COMPARISON:  CT of the abdomen and pelvis from 12/03/2014, and chest radiograph  performed 03/04/2015 FINDINGS: The lungs are well-aerated and clear. There is no evidence of focal opacification, pleural effusion or pneumothorax. The cardiomediastinal silhouette is within normal limits. There is diffuse distention of colonic loops, with air-fluid levels. This may reflect mild ileus. No free intra-abdominal air is identified on the provided upright view. No acute osseous abnormalities are seen; the sacroiliac joints are unremarkable in appearance. IMPRESSION: 1. Diffuse distention of colonic loops, with air-fluid levels. This may reflect mild ileus. No free intra-abdominal air seen. 2. No acute cardiopulmonary process seen. Electronically Signed   By: Roanna RaiderJeffery  Chang M.D.   On: 08/14/2015 03:30   Dg Abd Portable 1v-small Bowel Obstruction Protocol-initial, 8 Hr Delay  08/15/2015  CLINICAL DATA:  8 hour delayed images for assessing small bowel obstruction. EXAM: PORTABLE ABDOMEN - 1 VIEW COMPARISON:  Abdominal radiograph performed earlier today at 12:38 p.m. FINDINGS: Contrast has progressed to the colon, without evidence for bowel obstruction. The distended small bowel loop at the mid abdomen is again seen. Contrast is noted within the bladder. No free intra-abdominal air is seen, though evaluation for free air is limited on supine views. No acute osseous abnormalities are identified. An enteric tube is noted ending overlying the antrum of the stomach. IMPRESSION: Contrast has progressed to the colon. No evidence for bowel obstruction. Distended small bowel loop at the mid abdomen is grossly unchanged in appearance. No free intra-abdominal air seen. Electronically Signed   By: Roanna RaiderJeffery  Chang M.D.   On: 08/15/2015 01:37   Dg Abd Portable 1v-small Bowel Protocol-position Verification  08/14/2015  CLINICAL DATA:  Enteric tube placement EXAM: PORTABLE ABDOMEN - 1 VIEW COMPARISON:  08/14/2015 abdominal radiograph FINDINGS: Enteric tube terminates in the gastric fundus. Prominently dilated  featureless small bowel loop in the right abdomen is unchanged. Persistent diffuse mild to moderate distention of the visualized upper colon with gas and stool. No evidence of pneumatosis or pneumoperitoneum. Clear lung bases. IMPRESSION: Enteric tube terminates in the gastric fundus. Persistent prominently dilated featureless small bowel loop in the right abdomen, cannot exclude distal small bowel obstruction. Electronically Signed   By: Delbert PhenixJason A Poff M.D.   On: 08/14/2015 14:02    Microbiology: Recent Results (from the past 240 hour(s))  MRSA PCR Screening     Status: None   Collection Time: 08/14/15  3:32 PM  Result Value Ref Range Status   MRSA by PCR NEGATIVE NEGATIVE Final    Comment:        The GeneXpert MRSA Assay (FDA approved for NASAL specimens only), is one component of a comprehensive MRSA  colonization surveillance program. It is not intended to diagnose MRSA infection nor to guide or monitor treatment for MRSA infections.      Labs: Basic Metabolic Panel:  Recent Labs Lab 08/14/15 0208 08/15/15 0531  NA 137 137  K 3.4* 4.1  CL 104 106  CO2 23 24  GLUCOSE 108* 98  BUN 10 10  CREATININE 0.67 0.65  CALCIUM 9.7 8.8*   Liver Function Tests:  Recent Labs Lab 08/14/15 0208  AST 22  ALT 17  ALKPHOS 71  BILITOT 1.0  PROT 7.7  ALBUMIN 4.5    Recent Labs Lab 08/14/15 0208  LIPASE 17   No results for input(s): AMMONIA in the last 168 hours. CBC:  Recent Labs Lab 08/14/15 0208 08/15/15 0531  WBC 7.6 4.5  HGB 14.8 13.8  HCT 44.0 42.0  MCV 86.1 87.7  PLT 203 212   Cardiac Enzymes: No results for input(s): CKTOTAL, CKMB, CKMBINDEX, TROPONINI in the last 168 hours. BNP: BNP (last 3 results) No results for input(s): BNP in the last 8760 hours.  ProBNP (last 3 results) No results for input(s): PROBNP in the last 8760 hours.  CBG: No results for input(s): GLUCAP in the last 168 hours.   Signed:  Penny Pia MD.  [facility] 08/17/2015, 1:08 PM

## 2015-08-17 NOTE — Progress Notes (Signed)
  Progress Note: General Surgery Service   Subjective: Some nausea and diarrhea overnight, tolerating reg diet  Objective: Vital signs in last 24 hours: Temp:  [97.8 F (36.6 C)-98 F (36.7 C)] 98 F (36.7 C) (06/25 0644) Pulse Rate:  [72-87] 72 (06/25 0644) Resp:  [16-18] 16 (06/25 0644) BP: (109-130)/(54-77) 109/54 mmHg (06/25 0644) SpO2:  [98 %-100 %] 99 % (06/25 0809) Weight:  [94.53 kg (208 lb 6.4 oz)] 94.53 kg (208 lb 6.4 oz) (06/25 0547) Last BM Date: 08/11/15  Intake/Output from previous day: 06/24 0701 - 06/25 0700 In: 600 [P.O.:600] Out: -  Intake/Output this shift:    Lungs: CTAB  Cardiovascular: RRR  Abd: soft, NT, ND  Extremities: no edema  Neuro: AOx4  Lab Results: CBC   Recent Labs  08/15/15 0531  WBC 4.5  HGB 13.8  HCT 42.0  PLT 212   BMET  Recent Labs  08/15/15 0531  NA 137  K 4.1  CL 106  CO2 24  GLUCOSE 98  BUN 10  CREATININE 0.65  CALCIUM 8.8*   PT/INR No results for input(s): LABPROT, INR in the last 72 hours. ABG No results for input(s): PHART, HCO3 in the last 72 hours.  Invalid input(s): PCO2, PO2  Studies/Results:  Anti-infectives: Anti-infectives    None      Medications: Scheduled Meds: . enoxaparin (LOVENOX) injection  40 mg Subcutaneous Q24H  . famotidine (PEPCID) IV  20 mg Intravenous Q12H  . mometasone-formoterol  2 puff Inhalation BID  . tiotropium  18 mcg Inhalation Daily   Continuous Infusions:  PRN Meds:.acetaminophen **OR** acetaminophen, albuterol, diclofenac sodium, fentaNYL (SUBLIMAZE) injection, LORazepam, promethazine, traZODone  Assessment/Plan: Patient Active Problem List   Diagnosis Date Noted  . SBO (small bowel obstruction) (HCC) 08/14/2015  . Hypokalemia 08/14/2015  . Abdominal pain, lower   . Epigastric pain   . Partial small bowel obstruction (HCC) 03/22/2014  . Abdominal pain 03/22/2014  . COPD (chronic obstructive pulmonary disease) (HCC)   . Hypertension   . GERD  (gastroesophageal reflux disease)   . Hyperlipidemia   . Osteoarthritis    -return of bowel function -tolerating diet -no obstruction from surgical standpoint -please call if has recurrence of symptoms -no follow up recommended   LOS: 3 days   Rodman PickleLuke Aaron Kinsinger, MD Pg# [phone] [facility]

## 2015-10-24 DIAGNOSIS — K56609 Unspecified intestinal obstruction, unspecified as to partial versus complete obstruction: Secondary | ICD-10-CM

## 2015-10-24 HISTORY — DX: Unspecified intestinal obstruction, unspecified as to partial versus complete obstruction: K56.609

## 2015-11-17 ENCOUNTER — Encounter (HOSPITAL_COMMUNITY): Payer: Self-pay | Admitting: *Deleted

## 2015-11-17 ENCOUNTER — Emergency Department (HOSPITAL_COMMUNITY): Payer: Medicare HMO

## 2015-11-17 ENCOUNTER — Inpatient Hospital Stay (HOSPITAL_COMMUNITY)
Admission: EM | Admit: 2015-11-17 | Discharge: 2015-11-22 | DRG: 389 | Disposition: A | Discharge status: Home / Self Care | Payer: Medicare HMO | Attending: Internal Medicine | Admitting: Internal Medicine

## 2015-11-17 DIAGNOSIS — K8061 Calculus of gallbladder and bile duct with cholecystitis, unspecified, with obstruction: Secondary | ICD-10-CM | POA: Diagnosis present

## 2015-11-17 DIAGNOSIS — F329 Major depressive disorder, single episode, unspecified: Secondary | ICD-10-CM | POA: Diagnosis present

## 2015-11-17 DIAGNOSIS — K5669 Other intestinal obstruction: Secondary | ICD-10-CM | POA: Diagnosis not present

## 2015-11-17 DIAGNOSIS — Z90710 Acquired absence of both cervix and uterus: Secondary | ICD-10-CM | POA: Diagnosis not present

## 2015-11-17 DIAGNOSIS — Z96651 Presence of right artificial knee joint: Secondary | ICD-10-CM | POA: Diagnosis present

## 2015-11-17 DIAGNOSIS — K5660 Unspecified intestinal obstruction: Secondary | ICD-10-CM | POA: Diagnosis present

## 2015-11-17 DIAGNOSIS — K8051 Calculus of bile duct without cholangitis or cholecystitis with obstruction: Secondary | ICD-10-CM | POA: Diagnosis present

## 2015-11-17 DIAGNOSIS — M545 Low back pain: Secondary | ICD-10-CM | POA: Diagnosis present

## 2015-11-17 DIAGNOSIS — R112 Nausea with vomiting, unspecified: Secondary | ICD-10-CM

## 2015-11-17 DIAGNOSIS — Z9889 Other specified postprocedural states: Secondary | ICD-10-CM | POA: Diagnosis not present

## 2015-11-17 DIAGNOSIS — Z888 Allergy status to other drugs, medicaments and biological substances status: Secondary | ICD-10-CM | POA: Diagnosis not present

## 2015-11-17 DIAGNOSIS — E876 Hypokalemia: Secondary | ICD-10-CM | POA: Diagnosis present

## 2015-11-17 DIAGNOSIS — K219 Gastro-esophageal reflux disease without esophagitis: Secondary | ICD-10-CM | POA: Diagnosis present

## 2015-11-17 DIAGNOSIS — K8021 Calculus of gallbladder without cholecystitis with obstruction: Secondary | ICD-10-CM

## 2015-11-17 DIAGNOSIS — G8929 Other chronic pain: Secondary | ICD-10-CM | POA: Diagnosis present

## 2015-11-17 DIAGNOSIS — K2100 Gastro-esophageal reflux disease with esophagitis, without bleeding: Secondary | ICD-10-CM | POA: Insufficient documentation

## 2015-11-17 DIAGNOSIS — Z8249 Family history of ischemic heart disease and other diseases of the circulatory system: Secondary | ICD-10-CM | POA: Diagnosis not present

## 2015-11-17 DIAGNOSIS — Z7951 Long term (current) use of inhaled steroids: Secondary | ICD-10-CM | POA: Diagnosis not present

## 2015-11-17 DIAGNOSIS — Z91040 Latex allergy status: Secondary | ICD-10-CM | POA: Diagnosis not present

## 2015-11-17 DIAGNOSIS — J449 Chronic obstructive pulmonary disease, unspecified: Secondary | ICD-10-CM | POA: Diagnosis present

## 2015-11-17 DIAGNOSIS — K8050 Calculus of bile duct without cholangitis or cholecystitis without obstruction: Secondary | ICD-10-CM | POA: Diagnosis not present

## 2015-11-17 DIAGNOSIS — Z0189 Encounter for other specified special examinations: Secondary | ICD-10-CM

## 2015-11-17 DIAGNOSIS — F419 Anxiety disorder, unspecified: Secondary | ICD-10-CM | POA: Diagnosis present

## 2015-11-17 DIAGNOSIS — K660 Peritoneal adhesions (postprocedural) (postinfection): Secondary | ICD-10-CM | POA: Diagnosis present

## 2015-11-17 DIAGNOSIS — M25561 Pain in right knee: Secondary | ICD-10-CM

## 2015-11-17 DIAGNOSIS — I10 Essential (primary) hypertension: Secondary | ICD-10-CM | POA: Diagnosis not present

## 2015-11-17 DIAGNOSIS — K56609 Unspecified intestinal obstruction, unspecified as to partial versus complete obstruction: Secondary | ICD-10-CM | POA: Diagnosis present

## 2015-11-17 DIAGNOSIS — Z79899 Other long term (current) drug therapy: Secondary | ICD-10-CM | POA: Diagnosis not present

## 2015-11-17 DIAGNOSIS — E785 Hyperlipidemia, unspecified: Secondary | ICD-10-CM | POA: Diagnosis present

## 2015-11-17 DIAGNOSIS — J410 Simple chronic bronchitis: Secondary | ICD-10-CM | POA: Diagnosis not present

## 2015-11-17 DIAGNOSIS — Z886 Allergy status to analgesic agent status: Secondary | ICD-10-CM | POA: Diagnosis not present

## 2015-11-17 DIAGNOSIS — Z800 Family history of malignant neoplasm of digestive organs: Secondary | ICD-10-CM | POA: Diagnosis not present

## 2015-11-17 DIAGNOSIS — Z885 Allergy status to narcotic agent status: Secondary | ICD-10-CM | POA: Diagnosis not present

## 2015-11-17 DIAGNOSIS — G4700 Insomnia, unspecified: Secondary | ICD-10-CM | POA: Diagnosis present

## 2015-11-17 HISTORY — DX: Unspecified intestinal obstruction, unspecified as to partial versus complete obstruction: K56.609

## 2015-11-17 LAB — COMPREHENSIVE METABOLIC PANEL
ALT: 19 U/L (ref 14–54)
ANION GAP: 12 (ref 5–15)
AST: 37 U/L (ref 15–41)
Albumin: 4 g/dL (ref 3.5–5.0)
Alkaline Phosphatase: 71 U/L (ref 38–126)
BUN: 6 mg/dL (ref 6–20)
CHLORIDE: 100 mmol/L — AB (ref 101–111)
CO2: 23 mmol/L (ref 22–32)
Calcium: 9.9 mg/dL (ref 8.9–10.3)
Creatinine, Ser: 0.87 mg/dL (ref 0.44–1.00)
GFR calc non Af Amer: >60 mL/min (ref >60)
Glucose, Bld: 103 mg/dL — ABNORMAL HIGH (ref 65–99)
POTASSIUM: 3.4 mmol/L — AB (ref 3.5–5.1)
SODIUM: 135 mmol/L (ref 135–145)
Total Bilirubin: 0.8 mg/dL (ref 0.3–1.2)
Total Protein: 7.4 g/dL (ref 6.5–8.1)

## 2015-11-17 LAB — URINALYSIS, ROUTINE W REFLEX MICROSCOPIC
Bilirubin Urine: NEGATIVE
Glucose, UA: NEGATIVE mg/dL
Hgb urine dipstick: NEGATIVE
Ketones, ur: NEGATIVE mg/dL
NITRITE: NEGATIVE
PH: 5.5 (ref 5.0–8.0)
Protein, ur: NEGATIVE mg/dL

## 2015-11-17 LAB — CBC WITH DIFFERENTIAL/PLATELET
Basophils Absolute: 0 10*3/uL (ref 0.0–0.1)
Basophils Relative: 0 %
EOS ABS: 0.1 10*3/uL (ref 0.0–0.7)
EOS PCT: 2 %
HCT: 39.7 % (ref 36.0–46.0)
Hemoglobin: 13 g/dL (ref 12.0–15.0)
LYMPHS ABS: 2.9 10*3/uL (ref 0.7–4.0)
Lymphocytes Relative: 41 %
MCH: 28.6 pg (ref 26.0–34.0)
MCHC: 32.7 g/dL (ref 30.0–36.0)
MCV: 87.3 fL (ref 78.0–100.0)
Monocytes Absolute: 0.5 10*3/uL (ref 0.1–1.0)
Monocytes Relative: 7 %
Neutro Abs: 3.5 10*3/uL (ref 1.7–7.7)
Neutrophils Relative %: 50 %
PLATELETS: 225 10*3/uL (ref 150–400)
RBC: 4.55 MIL/uL (ref 3.87–5.11)
RDW: 14.6 % (ref 11.5–15.5)
WBC: 7 10*3/uL (ref 4.0–10.5)

## 2015-11-17 LAB — URINE MICROSCOPIC-ADD ON: RBC / HPF: NONE SEEN RBC/hpf (ref 0–5)

## 2015-11-17 MED ORDER — HYDROMORPHONE HCL 1 MG/ML IJ SOLN
0.5000 mg | Freq: Once | INTRAMUSCULAR | Status: AC
  Administered 2015-11-17: 0.5 mg via INTRAVENOUS
  Filled 2015-11-17: qty 1

## 2015-11-17 MED ORDER — LORAZEPAM 2 MG/ML IJ SOLN
1.0000 mg | Freq: Once | INTRAMUSCULAR | Status: AC
  Administered 2015-11-17: 1 mg via INTRAVENOUS
  Filled 2015-11-17: qty 1

## 2015-11-17 MED ORDER — ONDANSETRON HCL 4 MG/2ML IJ SOLN
4.0000 mg | Freq: Four times a day (QID) | INTRAMUSCULAR | Status: DC | PRN
  Administered 2015-11-17 – 2015-11-21 (×7): 4 mg via INTRAVENOUS
  Filled 2015-11-17 (×7): qty 2

## 2015-11-17 MED ORDER — MOMETASONE FURO-FORMOTEROL FUM 200-5 MCG/ACT IN AERO
2.0000 | INHALATION_SPRAY | Freq: Two times a day (BID) | RESPIRATORY_TRACT | Status: DC
  Administered 2015-11-17 – 2015-11-22 (×9): 2 via RESPIRATORY_TRACT
  Filled 2015-11-17: qty 8.8

## 2015-11-17 MED ORDER — FAMOTIDINE IN NACL 20-0.9 MG/50ML-% IV SOLN
20.0000 mg | Freq: Two times a day (BID) | INTRAVENOUS | Status: DC
  Administered 2015-11-17 – 2015-11-20 (×8): 20 mg via INTRAVENOUS
  Filled 2015-11-17 (×9): qty 50

## 2015-11-17 MED ORDER — TRAZODONE HCL 100 MG PO TABS
100.0000 mg | ORAL_TABLET | Freq: Every evening | ORAL | Status: DC | PRN
  Administered 2015-11-19 – 2015-11-21 (×3): 100 mg via ORAL
  Filled 2015-11-17 (×3): qty 1

## 2015-11-17 MED ORDER — HYDRALAZINE HCL 20 MG/ML IJ SOLN: 5.0000 mg | INTRAMUSCULAR | Status: DC | PRN

## 2015-11-17 MED ORDER — SODIUM CHLORIDE 0.9 % IV BOLUS (SEPSIS)
1000.0000 mL | Freq: Once | INTRAVENOUS | Status: AC
  Administered 2015-11-17: 1000 mL via INTRAVENOUS

## 2015-11-17 MED ORDER — BISACODYL 10 MG RE SUPP: 10.0000 mg | Freq: Every day | RECTAL | Status: DC | PRN

## 2015-11-17 MED ORDER — ROSUVASTATIN CALCIUM 10 MG PO TABS
10.0000 mg | ORAL_TABLET | Freq: Every day | ORAL | Status: DC
  Administered 2015-11-19 – 2015-11-22 (×4): 10 mg via ORAL
  Filled 2015-11-17 (×5): qty 1

## 2015-11-17 MED ORDER — PROMETHAZINE HCL 25 MG/ML IJ SOLN
12.5000 mg | Freq: Once | INTRAMUSCULAR | Status: AC
  Administered 2015-11-17: 12.5 mg via INTRAVENOUS
  Filled 2015-11-17: qty 1

## 2015-11-17 MED ORDER — DIPHENHYDRAMINE HCL 50 MG/ML IJ SOLN
25.0000 mg | Freq: Four times a day (QID) | INTRAMUSCULAR | Status: DC | PRN
  Administered 2015-11-20: 25 mg via INTRAVENOUS
  Filled 2015-11-17: qty 1

## 2015-11-17 MED ORDER — PROMETHAZINE HCL 25 MG PO TABS
12.5000 mg | ORAL_TABLET | ORAL | Status: DC | PRN
  Administered 2015-11-18: 12.5 mg via ORAL
  Filled 2015-11-17: qty 1

## 2015-11-17 MED ORDER — MAGNESIUM CITRATE PO SOLN: 1.0000 | Freq: Once | ORAL | Status: DC | PRN

## 2015-11-17 MED ORDER — LISINOPRIL 20 MG PO TABS
20.0000 mg | ORAL_TABLET | Freq: Every day | ORAL | Status: DC
  Administered 2015-11-19 – 2015-11-22 (×4): 20 mg via ORAL
  Filled 2015-11-17 (×4): qty 1

## 2015-11-17 MED ORDER — LIDOCAINE VISCOUS 2 % MT SOLN
15.0000 mL | Freq: Four times a day (QID) | OROMUCOSAL | Status: DC | PRN
  Administered 2015-11-17: 15 mL via OROMUCOSAL
  Filled 2015-11-17: qty 15

## 2015-11-17 MED ORDER — SERTRALINE HCL 100 MG PO TABS
100.0000 mg | ORAL_TABLET | Freq: Every day | ORAL | Status: DC
  Administered 2015-11-19 – 2015-11-22 (×4): 100 mg via ORAL
  Filled 2015-11-17 (×4): qty 1

## 2015-11-17 MED ORDER — ACETAMINOPHEN 325 MG PO TABS: 650.0000 mg | ORAL_TABLET | Freq: Four times a day (QID) | ORAL | Status: DC | PRN

## 2015-11-17 MED ORDER — AMLODIPINE BESYLATE 10 MG PO TABS
10.0000 mg | ORAL_TABLET | Freq: Every day | ORAL | Status: DC
  Administered 2015-11-19 – 2015-11-22 (×4): 10 mg via ORAL
  Filled 2015-11-17 (×4): qty 1

## 2015-11-17 MED ORDER — SENNOSIDES-DOCUSATE SODIUM 8.6-50 MG PO TABS: 1.0000 | ORAL_TABLET | Freq: Every evening | ORAL | Status: DC | PRN

## 2015-11-17 MED ORDER — TRIAMTERENE-HCTZ 37.5-25 MG PO TABS
1.0000 | ORAL_TABLET | Freq: Every day | ORAL | Status: DC
  Administered 2015-11-19 – 2015-11-22 (×4): 1 via ORAL
  Filled 2015-11-17 (×6): qty 1

## 2015-11-17 MED ORDER — GABAPENTIN 300 MG PO CAPS
300.0000 mg | ORAL_CAPSULE | Freq: Three times a day (TID) | ORAL | Status: DC
  Administered 2015-11-18 – 2015-11-20 (×3): 300 mg via ORAL
  Filled 2015-11-17 (×6): qty 1

## 2015-11-17 MED ORDER — HYDROMORPHONE HCL 1 MG/ML IJ SOLN: 0.5000 mg | INTRAMUSCULAR | Status: AC | PRN

## 2015-11-17 MED ORDER — INFLUENZA VAC SPLIT QUAD 0.5 ML IM SUSY
0.5000 mL | PREFILLED_SYRINGE | INTRAMUSCULAR | Status: AC
  Administered 2015-11-22: 0.5 mL via INTRAMUSCULAR
  Filled 2015-11-17: qty 0.5

## 2015-11-17 MED ORDER — MORPHINE SULFATE (PF) 4 MG/ML IV SOLN
4.0000 mg | Freq: Once | INTRAVENOUS | Status: AC
  Administered 2015-11-17: 4 mg via INTRAVENOUS
  Filled 2015-11-17: qty 1

## 2015-11-17 MED ORDER — ALBUTEROL SULFATE (2.5 MG/3ML) 0.083% IN NEBU
2.5000 mg | INHALATION_SOLUTION | Freq: Four times a day (QID) | RESPIRATORY_TRACT | Status: DC | PRN
  Filled 2015-11-17: qty 3

## 2015-11-17 MED ORDER — PROMETHAZINE HCL 25 MG RE SUPP
25.0000 mg | Freq: Once | RECTAL | Status: AC
  Administered 2015-11-17: 25 mg via RECTAL
  Filled 2015-11-17: qty 1

## 2015-11-17 MED ORDER — POTASSIUM CHLORIDE 10 MEQ/100ML IV SOLN
10.0000 meq | Freq: Once | INTRAVENOUS | Status: AC
  Administered 2015-11-17: 10 meq via INTRAVENOUS
  Filled 2015-11-17: qty 100

## 2015-11-17 MED ORDER — LORAZEPAM 0.5 MG PO TABS: 0.5000 mg | ORAL_TABLET | Freq: Four times a day (QID) | ORAL | Status: DC | PRN

## 2015-11-17 MED ORDER — SODIUM CHLORIDE 0.9 % IV SOLN
INTRAVENOUS | Status: DC
  Administered 2015-11-17 – 2015-11-21 (×4): via INTRAVENOUS

## 2015-11-17 MED ORDER — ALBUTEROL SULFATE HFA 108 (90 BASE) MCG/ACT IN AERS: 1.0000 | INHALATION_SPRAY | Freq: Four times a day (QID) | RESPIRATORY_TRACT | Status: DC | PRN

## 2015-11-17 MED ORDER — ENOXAPARIN SODIUM 40 MG/0.4ML ~~LOC~~ SOLN
40.0000 mg | SUBCUTANEOUS | Status: DC
  Administered 2015-11-17 – 2015-11-22 (×6): 40 mg via SUBCUTANEOUS
  Filled 2015-11-17 (×6): qty 0.4

## 2015-11-17 MED ORDER — DIPHENHYDRAMINE HCL 50 MG/ML IJ SOLN
25.0000 mg | Freq: Once | INTRAMUSCULAR | Status: AC
  Administered 2015-11-17: 25 mg via INTRAVENOUS
  Filled 2015-11-17: qty 1

## 2015-11-17 MED ORDER — ONDANSETRON HCL 4 MG/2ML IJ SOLN: 4.0000 mg | Freq: Four times a day (QID) | INTRAMUSCULAR | Status: DC

## 2015-11-17 MED ORDER — IOPAMIDOL (ISOVUE-300) INJECTION 61%
INTRAVENOUS | Status: AC
  Filled 2015-11-17: qty 100

## 2015-11-17 MED ORDER — PANTOPRAZOLE SODIUM 40 MG PO TBEC
40.0000 mg | DELAYED_RELEASE_TABLET | Freq: Every day | ORAL | Status: DC
  Administered 2015-11-19 – 2015-11-22 (×4): 40 mg via ORAL
  Filled 2015-11-17 (×4): qty 1

## 2015-11-17 MED ORDER — TIOTROPIUM BROMIDE MONOHYDRATE 18 MCG IN CAPS
18.0000 ug | ORAL_CAPSULE | Freq: Every day | RESPIRATORY_TRACT | Status: DC
  Administered 2015-11-17 – 2015-11-22 (×4): 18 ug via RESPIRATORY_TRACT
  Filled 2015-11-17: qty 5

## 2015-11-17 MED ORDER — ACETAMINOPHEN 650 MG RE SUPP: 650.0000 mg | Freq: Four times a day (QID) | RECTAL | Status: DC | PRN

## 2015-11-17 MED ORDER — IOPAMIDOL (ISOVUE-300) INJECTION 61%
INTRAVENOUS | Status: AC
  Administered 2015-11-17: 100 mL
  Filled 2015-11-17: qty 100

## 2015-11-17 NOTE — H&P (Signed)
History and Physical    [name] ZOX:[id number] DOB: [birth year] DOA: 11/17/2015   PCP: Pcp Not In System   Patient coming from:  Home   Chief Complaint: Abdominal pain   HPI: [name] is a 55 y.o. female with medical history significant for asthma, chronic bronchitis, COPD, Gerd, depression, hyperlipidemia, hypertension, osteoarthritis, and history of SBO related to postsurgical changes in 2003, after her hysterectomy, with recurrent admissions.Her last visit was on 08/14/2015. She presents with acute onset of abdominal pain since last evening, epigastric, radiating to the lower quadrant, sharp, 9 out of 10. This is associated with nausea, and intermittent vomiting. The patient has not been able to eat or keep medications down. Her last bowel movement was 3 to 4 days prior to admission. No diarrhea. No food poisoning. No recent long distance trips. No sick contacts. Denies fevers, chills, night sweats, vision changes, or mucositis. Denies any respiratory complaints. Denies any chest pain or palpitations. Denies lower extremity swelling.   Denies any dysuria. Denies abnormal skin rashes, or neuropathy. Denies any bleeding issues such as epistaxis, hematemesis, hematuria or hematochezia. Ambulating without difficulty.   ED Course:  BP 102/85   Pulse 101   Temp 98.3 F (36.8 C) (Oral)   Resp 18   Ht 5\' 2"  (1.575 m)   Wt 75.8 kg (167 lb)   SpO2 100%   BMI 30.54 kg/m    CT of the abdomen and pelvis shows Chronically dilated air-filled distal small bowel loop in the mid and lower abdomen may represent chronic ileus or related to functional dysmotility. A degree of small-bowel obstruction is not entirely excluded. There is postsurgical changes of anterior abdominal wall with adhesion of this loop of bowel to the anterior peritoneum. Cholelithiasis  WBC normal at 7  potassium 3.4  LFTs normal   Review of Systems: As per HPI otherwise 10 point review of systems negative.   Past  Medical History:  Diagnosis Date  . Asthma   . Cataract   . Chronic bronchitis (HCC)   . Chronic lower back pain   . COPD (chronic obstructive pulmonary disease) (HCC)   . Depression   . GERD (gastroesophageal reflux disease)   . Heart murmur   . History of blood transfusion 2003-2004   "related to bowel obstruction OR"  . Hyperlipidemia   . Hypertension   . Insomnia   . Osteoarthritis    "lower back; both hips; right leg" (08/01/2014)    Past Surgical History:  Procedure Laterality Date  . ABDOMINAL EXPLORATION SURGERY  2004   abdominal compartment syndrome  . ABDOMINAL EXPLORATION SURGERY  2003   small bowel prolapse through vagina  . ABDOMINAL HYSTERECTOMY  2003  . ADENOIDECTOMY  2002  . BOWEL RESECTION  2003; 2004   x2 with primary anastamosis  . COMBINED HYSTEROSCOPY DIAGNOSTIC / D&C  ~ 2003  . HERNIA REPAIR    . JOINT REPLACEMENT    . LAPAROSCOPIC INCISIONAL / UMBILICAL / VENTRAL HERNIA REPAIR  2012   [facility], Dr. Carolynn Sayers  . TONSILLECTOMY  1977  . TOTAL KNEE ARTHROPLASTY Right 03/2012   [facility]    Social History Social History   Social History  . Marital status: Legally Separated    Spouse name: N/A  . Number of children: N/A  . Years of education: N/A   Occupational History  . Not on file.   Social History Main Topics  . Smoking status: Never Smoker  . Smokeless tobacco: Never Used  .  Alcohol use No  . Drug use: No  . Sexual activity: No   Other Topics Concern  . Not on file   Social History Narrative  . No narrative on file     Allergies  Allergen Reactions  . Bee Venom Anaphylaxis and Itching  . Peanuts [Peanut Oil] Anaphylaxis and Itching  . Shellfish Allergy Anaphylaxis and Itching  . Aspirin Itching  . Corn-Containing Products Itching  . Ibuprofen Itching  . Latex Itching  . Morphine And Related Itching  . Zofran [Ondansetron Hcl] Itching and Rash    Family History  Problem Relation Age of Onset  . Cancer Mother   . Heart attack  Father   . Colon cancer Maternal Grandmother   . Colon cancer Paternal Grandmother       Prior to Admission medications   Medication Sig Start Date End Date Taking? Authorizing Provider  albuterol (PROVENTIL HFA;VENTOLIN HFA) 108 (90 BASE) MCG/ACT inhaler Inhale 1 puff into the lungs every 6 (six) hours as needed for wheezing or shortness of breath (wheezing).    Yes Historical Provider, MD  amLODipine (NORVASC) 10 MG tablet Take 10 mg by mouth daily.   Yes Historical Provider, MD  diclofenac sodium (VOLTAREN) 1 % GEL Apply 2 g topically 4 (four) times daily as needed (knee pain).    Yes Historical Provider, MD  diphenhydrAMINE (BENADRYL) 25 MG tablet Take 25 mg by mouth every 6 (six) hours as needed for allergies (allergies).   Yes Historical Provider, MD  esomeprazole (NEXIUM) 40 MG capsule Take 40 mg by mouth daily at 12 noon.   Yes Historical Provider, MD  Fluticasone-Salmeterol (ADVAIR) 500-50 MCG/DOSE AEPB Inhale 1 puff into the lungs 2 (two) times daily.   Yes Historical Provider, MD  gabapentin (NEURONTIN) 300 MG capsule Take 300 mg by mouth 3 (three) times daily.   Yes Historical Provider, MD  lisinopril (PRINIVIL,ZESTRIL) 20 MG tablet Take 20 mg by mouth daily.   Yes Historical Provider, MD  loratadine (CLARITIN) 10 MG tablet Take 10 mg by mouth daily.   Yes Historical Provider, MD  LORazepam (ATIVAN) 0.5 MG tablet Take 0.5 mg by mouth every 6 (six) hours as needed for anxiety (anxiety).    Yes Historical Provider, MD  meloxicam (MOBIC) 15 MG tablet Take 15 mg by mouth daily as needed for pain.  09/18/14  Yes Historical Provider, MD  mometasone (NASONEX) 50 MCG/ACT nasal spray USE 1 PUFF IN THE NOSTRILS TWICE A DAY AS NEEDED FOR CONGESTION 07/14/14  Yes Historical Provider, MD  Multiple Vitamin (MULTIVITAMIN WITH MINERALS) TABS tablet Take 1 tablet by mouth daily.   Yes Historical Provider, MD  polyethylene glycol (MIRALAX / GLYCOLAX) packet Take 17 g by mouth daily as needed for mild  constipation or moderate constipation (constipation).    Yes Historical Provider, MD  promethazine (PHENERGAN) 25 MG tablet Take 25 mg by mouth every 8 (eight) hours as needed for nausea or vomiting (nausea).  08/20/13  Yes Historical Provider, MD  rosuvastatin (CRESTOR) 10 MG tablet Take 10 mg by mouth daily.   Yes Historical Provider, MD  sertraline (ZOLOFT) 100 MG tablet Take 100 mg by mouth daily.   Yes Historical Provider, MD  tiotropium (SPIRIVA) 18 MCG inhalation capsule Place 18 mcg into inhaler and inhale daily.   Yes Historical Provider, MD  traMADol (ULTRAM) 50 MG tablet Take 1 tablet (50 mg total) by mouth every 6 (six) hours as needed. 06/02/15  Yes Samantha Tripp Dowless, PA-C  traZODone (DESYREL)  100 MG tablet Take 100 mg by mouth at bedtime as needed for sleep.  06/09/14  Yes Historical Provider, MD  triamterene-hydrochlorothiazide (MAXZIDE-25) 37.5-25 MG tablet Take 1 tablet by mouth daily. 11/06/14  Yes Historical Provider, MD  bisacodyl (DULCOLAX) 10 MG suppository Place 1 suppository (10 mg total) rectally daily as needed for moderate constipation. Patient not taking: Reported on 11/17/2015 08/03/14   [name], MD  ipratropium (ATROVENT) 0.06 % nasal spray Place 2 sprays into the nose 4 (four) times daily. Patient not taking: Reported on 11/17/2015 05/05/13   Linna Hoff, MD  methocarbamol (ROBAXIN) 500 MG tablet Take 1 tablet (500 mg total) by mouth 2 (two) times daily as needed (back pain). Patient not taking: Reported on 11/17/2015 03/04/15   Carlene Coria, PA-C    Physical Exam:    Vitals:   11/17/15 0430 11/17/15 0445 11/17/15 0500 11/17/15 0515  BP: 130/84 152/85 152/85 102/85  Pulse: 101 99 92 101  Resp:      Temp:      TempSrc:      SpO2: 99% 100% 99% 100%  Weight:      Height:           Constitutional: Very uncomfortable, ill appearing  Vitals:   11/17/15 0430 11/17/15 0445 11/17/15 0500 11/17/15 0515  BP: 130/84 152/85 152/85 102/85  Pulse: 101 99 92  101  Resp:      Temp:      TempSrc:      SpO2: 99% 100% 99% 100%  Weight:      Height:       Eyes: PERRL, lids and conjunctivae normal ENMT: Mucous membranes are moist. Posterior pharynx clear of any exudate or lesions.Normal dentition. NGT present Neck: normal, supple, no masses, no thyromegaly Respiratory: clear to auscultation bilaterally, no wheezing, no crackles. Normal respiratory effort. No accessory muscle use.  Cardiovascular: Regular rate and rhythm, no murmurs / rubs / gallops. No extremity edema. 2+ pedal pulses. No carotid bruits.  Abdomen: mid epigastric and lower quadrants tenderness, midline scar from remote surgery, no new masses palpated, BSx4 Musculoskeletal: no clubbing / cyanosis. No joint deformity upper and lower extremities. Good ROM, no contractures. Normal muscle tone.  Skin: no rashes, lesions, ulcers.  Neurologic: CN 2-12 grossly intact. Sensation intact, DTR normal. Strength 5/5 in all 4.  Psychiatric: Normal judgment and insight. Alert and oriented x 3. Normal mood.     Labs on Admission: I have personally reviewed following labs and imaging studies  CBC:  Recent Labs Lab 11/17/15 0017  WBC 7.0  NEUTROABS 3.5  HGB 13.0  HCT 39.7  MCV 87.3  PLT 225    Basic Metabolic Panel:  Recent Labs Lab 11/17/15 0017  NA 135  K 3.4*  CL 100*  CO2 23  GLUCOSE 103*  BUN 6  CREATININE 0.87  CALCIUM 9.9    GFR: Estimated Creatinine Clearance: 70.5 mL/min (by C-G formula based on SCr of 0.87 mg/dL).  Liver Function Tests:  Recent Labs Lab 11/17/15 0017  AST 37  ALT 19  ALKPHOS 71  BILITOT 0.8  PROT 7.4  ALBUMIN 4.0   No results for input(s): LIPASE, AMYLASE in the last 168 hours. No results for input(s): AMMONIA in the last 168 hours.  Coagulation Profile: No results for input(s): INR, PROTIME in the last 168 hours.  Cardiac Enzymes: No results for input(s): CKTOTAL, CKMB, CKMBINDEX, TROPONINI in the last 168 hours.  BNP (last 3  results) No results for input(s):  PROBNP in the last 8760 hours.  HbA1C: No results for input(s): HGBA1C in the last 72 hours.  CBG: No results for input(s): GLUCAP in the last 168 hours.  Lipid Profile: No results for input(s): CHOL, HDL, LDLCALC, TRIG, CHOLHDL, LDLDIRECT in the last 72 hours.  Thyroid Function Tests: No results for input(s): TSH, T4TOTAL, FREET4, T3FREE, THYROIDAB in the last 72 hours.  Anemia Panel: No results for input(s): VITAMINB12, FOLATE, FERRITIN, TIBC, IRON, RETICCTPCT in the last 72 hours.  Urine analysis:    Component Value Date/Time   COLORURINE YELLOW 08/14/2015 1527   APPEARANCEUR CLOUDY (A) 08/14/2015 1527   LABSPEC >1.046 (H) 08/14/2015 1527   PHURINE 5.5 08/14/2015 1527   GLUCOSEU NEGATIVE 08/14/2015 1527   HGBUR NEGATIVE 08/14/2015 1527   BILIRUBINUR NEGATIVE 08/14/2015 1527   KETONESUR NEGATIVE 08/14/2015 1527   PROTEINUR NEGATIVE 08/14/2015 1527   UROBILINOGEN 1.0 07/31/2014 0000   NITRITE NEGATIVE 08/14/2015 1527   LEUKOCYTESUR SMALL (A) 08/14/2015 1527    Sepsis Labs: @LABRCNTIP (procalcitonin:4,lacticidven:4) )No results found for this or any previous visit (from the past 240 hour(s)).   Radiological Exams on Admission: Dg Knee 2 Views Right  Result Date: 11/17/2015 CLINICAL DATA:  Acute onset of pain and swelling at the right knee. Initial encounter. EXAM: RIGHT KNEE - 1-2 VIEW COMPARISON:  Right knee radiographs performed 06/02/2015 FINDINGS: There is no evidence of fracture or dislocation. 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 A small knee joint effusion is noted. No definite soft tissue abnormalities are otherwise seen. IMPRESSION: 1. No evidence of fracture or dislocation. Total knee arthroplasty is grossly unremarkable in appearance, without evidence of loosening. 2. Small knee joint effusion noted. Electronically Signed   By: Roanna Raider M.D.   On:  11/17/2015 01:22   Ct Abdomen Pelvis W Contrast  Result Date: 11/17/2015 CLINICAL DATA:  55 year old female with abdominal pain. Evaluate for obstruction. EXAM: CT ABDOMEN AND PELVIS WITH CONTRAST TECHNIQUE: Multidetector CT imaging of the abdomen and pelvis was performed using the standard protocol following bolus administration of intravenous contrast. CONTRAST:  ISOVUE-300 IOPAMIDOL (ISOVUE-300) INJECTION 61% COMPARISON:  Abdominal radiographs dated 11/17/2015 and CT dated 08/14/2015 FINDINGS: Lower chest: Minimal bibasilar haziness, likely atelectatic changes. The visualized lung bases are otherwise clear. No intra-abdominal free air or free fluid. Hepatobiliary: Probable mild fatty infiltration of the liver. No intrahepatic biliary ductal dilatation. There is a 1.8 cm stone or clusters of smaller stones in the gallbladder neck. No pericholecystic fluid. Pancreas: Unremarkable. No pancreatic ductal dilatation or surrounding inflammatory changes. Spleen: Normal in size without focal abnormality. Adrenals/Urinary Tract: Adrenal glands are unremarkable. Kidneys are normal, without renal calculi, focal lesion, or hydronephrosis. Bladder is unremarkable. Stomach/Bowel: There is postsurgical changes of the anterior abdominal wall with a midline vertical anterior abdominal wall incisional scar. There is abutment of loops of small bowel to the anterior peritoneal wall compatible with adhesions. An air distended loop of distal small bowel with air-fluid level noted in the mid and lower abdomen anteriorly as seen on the prior CTs. This may represent an abnormal chronically dilated segment of bowel versus a segment of bowel with functional dysmotility. A degree of underlying bowel obstruction is less likely but not excluded. There is no active bowel inflammation. Vascular/Lymphatic: There is mild aortoiliac atherosclerotic disease. The origins of the celiac axis, SMA, IMA as well as the origins of the renal  arteries are patent. No portal venous gas identified. There is no adenopathy.  Reproductive: Hysterectomy. Other: None Musculoskeletal: No acute or significant osseous findings. IMPRESSION: Chronically dilated air-filled distal small bowel loop in the mid and lower abdomen may represent chronic ileus or related to functional dysmotility. A degree of small-bowel obstruction is not entirely excluded. There is postsurgical changes of anterior abdominal wall with adhesion of this loop of bowel to the anterior peritoneum. Cholelithiasis. Electronically Signed   By: Elgie CollardArash  Radparvar M.D.   On: 11/17/2015 06:17   Dg Abdomen Acute W/chest  Result Date: 11/17/2015 CLINICAL DATA:  Acute onset of generalized abdominal pain and nausea. Initial encounter. EXAM: DG ABDOMEN ACUTE W/ 1V CHEST COMPARISON:  Chest and abdominal radiographs performed 08/14/2015 FINDINGS: The lungs are well-aerated and clear. There is no evidence of focal opacification, pleural effusion or pneumothorax. The cardiomediastinal silhouette is within normal limits. There is diffuse dilatation of the colon, measuring up to 13.8 cm in size. This may reflect a cecal volvulus. Remaining bowel loops are grossly unremarkable. No free intra-abdominal air is identified on the provided upright view. No acute osseous abnormalities are seen; the sacroiliac joints are unremarkable in appearance. IMPRESSION: Diffuse dilatation of the colon, measuring up to 13.8 cm in size. This may reflect a cecal volvulus. Would correlate with the patient's symptoms, and evaluate further as deemed clinically appropriate. No free intra-abdominal air seen. Electronically Signed   By: Roanna RaiderJeffery  Chang M.D.   On: 11/17/2015 01:20   Dg Abd Portable 1 View  Result Date: 11/17/2015 CLINICAL DATA:  55 year old female with enteric tube placement. EXAM: PORTABLE ABDOMEN - 1 VIEW COMPARISON:  Abdominal radiograph dated 11/17/2015 FINDINGS: There has been interval placement of the partially  visualized enteric tube with tip in the left upper abdomen likely within the stomach. Air distended loop of bowel likely transverse colon measures up to 11 cm in diameter. Another air distended loop of bowel in the right hemipelvis noted which may represent the cecum. This findings are relatively similar to the CT dated 08/14/2015. The descending colon demonstrates a normal caliber. No free air identified. No radiopaque calculi. The osseous structures are grossly unremarkable. IMPRESSION: Interval placement of an enteric tube with tip in the left upper abdomen likely in the stomach. Air distended cecum and transverse colon similar in appearance to the CT dated 08/14/2015. Electronically Signed   By: Elgie CollardArash  Radparvar M.D.   On: 11/17/2015 04:55    EKG: Independently reviewed.  Assessment/Plan Active Problems:   SBO (small bowel obstruction) (HCC)  Abdominal pain and intractable nausea and clear vomiting without flatus due to recurrent  Chronic Small Bowel Obstruction per CT abdomen, chronic in nature. .  Admit to med surg Bowel rest /NPO IVF at 125 per hour  KCL 10 meq x 1 IV Zofran prn for nausea  Continue NGTube IV pepcid q 12 hrs for GI prophylaxis  Dilaudid prn for pain KUB in am  Gen surg to consult  GERD, no acute symptoms: Continue PPI  COPD not on exacerbation Continue albuterol prn, spiriva, Dulera (Advair)  Hypertension BP 102/85   Pulse 101   Controlled Hold home anti-hypertensive medications until able to tolerate oral  Add Hydralazine Q6 hours as needed for BP 160/90   Anxiety/Depression Continue ativan prn Hold Zoloft and gabapentin till able to lerate oral   DVT prophylaxis: Lovenox  Code Status:   Full     Family Communication:  Discussed with patient  Disposition Plan: Expect patient to be discharged to home after condition improves Consults called:    None Admission status:  Medsurg   Obs     Marcos Eke, PA-C [facility]   If 7PM-7AM, please  contact night-coverage [email or web address] Password [email or web address]  11/17/2015, 7:03 AM

## 2015-11-17 NOTE — ED Triage Notes (Signed)
Patient presents stating she twisted her right knee getting off the bed last night and c/o pain to RUQ with history of blockages needing NG tubes

## 2015-11-17 NOTE — ED Notes (Signed)
Fluids going pt now waiting for be transported to CT.

## 2015-11-17 NOTE — Plan of Care (Signed)
55 year old female presents with abdominal pain and nausea vomiting has had recurrent small bowel obstruction. CT scan shows features concerning for obstruction with no transition point. ER PA Ms. Muthersbaugh will be notifying general surgery and patient will be admitted for further management of small bowel obstruction. NG tube to be placed.  [name].

## 2015-11-17 NOTE — Consult Note (Signed)
[facility] Surgery Consult Note  [name] [facility] 08-19-1960  [id number].    Requesting MD: Thayer Jew MD Chief Complaint/Reason for Consult: Small bowel obstruction  HPI:  [name] is a 55yo female PMH asthma, chronic bronchitis, COPD, GERD, depression, HLD, HTN, osteoarthritis, and history of SBO, who presented to [facility] earlier today with acute onset diffuse abdominal pain with associated nausea and vomiting. States that her pain began about 2 days ago and has progressively gotten worse. She has been hospitalized 3 times in the last year with SBO; states that this pain feels the same as before. She also reports abdominal distension. Denies dysuria, diarrhea, hematochezia, melena or fevers. Her last admission was on 08/14/2015 for SBO and was managed medically. Last meal yesterday Last BM 11/08/15. +flatus this a.m.  CT scan today shows chronically dilated air-filled distal small bowel loop in the mid and lower abdomen may represent chronic ileus or related to functional dysmotility. A degree of small-bowel obstruction is not entirely excluded.  Abdominal surgical history includes hysterectomy 2003 with inadvertent intestinal injury requiring higher level transfer and multiple procedures, incisional hernia repair She is not on any anticoagulants at home Nonsmoker  ROS: All systems reviewed and otherwise negative except for as above  Family History  Problem Relation Age of Onset  . Cancer Mother   . Heart attack Father   . Colon cancer Maternal Grandmother   . Colon cancer Paternal Grandmother     Past Medical History:  Diagnosis Date  . Asthma   . Cataract   . Chronic bronchitis ([place])   . Chronic lower back pain   . COPD (chronic obstructive pulmonary disease) ([place])   . Depression   . GERD (gastroesophageal reflux disease)   . Heart murmur   . History of blood transfusion 2003-2004   "related to bowel obstruction OR"  . Hyperlipidemia   . Hypertension   .  Insomnia   . Osteoarthritis    "lower back; both hips; right leg" (08/01/2014)  . SBO (small bowel obstruction) ([place]) 10/2015    Past Surgical History:  Procedure Laterality Date  . ABDOMINAL EXPLORATION SURGERY  2004   abdominal compartment syndrome  . ABDOMINAL EXPLORATION SURGERY  2003   small bowel prolapse through vagina  . ABDOMINAL HYSTERECTOMY  2003  . ADENOIDECTOMY  2002  . BOWEL RESECTION  2003; 2004   x2 with primary anastamosis  . COMBINED HYSTEROSCOPY DIAGNOSTIC / D&C  ~ 2003  . HERNIA REPAIR    . JOINT REPLACEMENT    . LAPAROSCOPIC INCISIONAL / UMBILICAL / VENTRAL HERNIA REPAIR  2012   [facility], Dr. Abran Richard  . TONSILLECTOMY  1977  . TOTAL KNEE ARTHROPLASTY Right 03/2012   [facility]    Social History:  reports that she has never smoked. She has never used smokeless tobacco. She reports that she does not drink alcohol or use drugs.  Allergies:  Allergies  Allergen Reactions  . Bee Venom Anaphylaxis and Itching  . Peanuts [Peanut Oil] Anaphylaxis and Itching  . Shellfish Allergy Anaphylaxis and Itching  . Aspirin Itching  . Corn-Containing Products Itching  . Ibuprofen Itching  . Latex Itching  . Morphine And Related Itching  . Zofran [Ondansetron Hcl] Itching and Rash    Medications Prior to Admission  Medication Sig Dispense Refill  . albuterol (PROVENTIL HFA;VENTOLIN HFA) 108 (90 BASE) MCG/ACT inhaler Inhale 1 puff into the lungs every 6 (six) hours as needed for wheezing or shortness of breath (wheezing).     Marland Kitchen  amLODipine (NORVASC) 10 MG tablet Take 10 mg by mouth daily.    . diclofenac sodium (VOLTAREN) 1 % GEL Apply 2 g topically 4 (four) times daily as needed (knee pain).     Marland Kitchen diphenhydrAMINE (BENADRYL) 25 MG tablet Take 25 mg by mouth every 6 (six) hours as needed for allergies (allergies).    Marland Kitchen esomeprazole (NEXIUM) 40 MG capsule Take 40 mg by mouth daily at 12 noon.    . Fluticasone-Salmeterol (ADVAIR) 500-50 MCG/DOSE AEPB Inhale 1 puff into the lungs 2  (two) times daily.    Marland Kitchen gabapentin (NEURONTIN) 300 MG capsule Take 300 mg by mouth 3 (three) times daily.    Marland Kitchen lisinopril (PRINIVIL,ZESTRIL) 20 MG tablet Take 20 mg by mouth daily.    Marland Kitchen loratadine (CLARITIN) 10 MG tablet Take 10 mg by mouth daily.    Marland Kitchen LORazepam (ATIVAN) 0.5 MG tablet Take 0.5 mg by mouth every 6 (six) hours as needed for anxiety (anxiety).     . meloxicam (MOBIC) 15 MG tablet Take 15 mg by mouth daily as needed for pain.   3  . mometasone (NASONEX) 50 MCG/ACT nasal spray USE 1 PUFF IN THE NOSTRILS TWICE A DAY AS NEEDED FOR CONGESTION  0  . Multiple Vitamin (MULTIVITAMIN WITH MINERALS) TABS tablet Take 1 tablet by mouth daily.    . polyethylene glycol (MIRALAX / GLYCOLAX) packet Take 17 g by mouth daily as needed for mild constipation or moderate constipation (constipation).     . promethazine (PHENERGAN) 25 MG tablet Take 25 mg by mouth every 8 (eight) hours as needed for nausea or vomiting (nausea).     . rosuvastatin (CRESTOR) 10 MG tablet Take 10 mg by mouth daily.    . sertraline (ZOLOFT) 100 MG tablet Take 100 mg by mouth daily.    Marland Kitchen tiotropium (SPIRIVA) 18 MCG inhalation capsule Place 18 mcg into inhaler and inhale daily.    . traMADol (ULTRAM) 50 MG tablet Take 1 tablet (50 mg total) by mouth every 6 (six) hours as needed. 30 tablet 0  . traZODone (DESYREL) 100 MG tablet Take 100 mg by mouth at bedtime as needed for sleep.   1  . triamterene-hydrochlorothiazide (MAXZIDE-25) 37.5-25 MG tablet Take 1 tablet by mouth daily.  3  . bisacodyl (DULCOLAX) 10 MG suppository Place 1 suppository (10 mg total) rectally daily as needed for moderate constipation. (Patient not taking: Reported on 11/17/2015) 12 suppository 0  . ipratropium (ATROVENT) 0.06 % nasal spray Place 2 sprays into the nose 4 (four) times daily. (Patient not taking: Reported on 11/17/2015) 15 mL 12  . methocarbamol (ROBAXIN) 500 MG tablet Take 1 tablet (500 mg total) by mouth 2 (two) times daily as needed (back  pain). (Patient not taking: Reported on 11/17/2015) 20 tablet 0    Blood pressure 115/66, pulse 88, temperature 97.8 F (36.6 C), temperature source Oral, resp. rate 20, height 5' 2" (1.575 m), weight 167 lb (75.8 kg), SpO2 97 %. Physical Exam: General: pleasant, WD/WN AA female who is laying in bed in NAD HEENT: head is normocephalic, atraumatic.   Heart: regular, rate, and rhythm.  No obvious murmurs, gallops, or rubs noted.  Palpable pedal pulses bilaterally Lungs: CTAB, no wheezes, rhonchi, or rales noted.  Respiratory effort nonlabored Abd: well healed midline surgical incision. soft, mildly distended, hypoactive bowel sounds, nontender MS: all 4 extremities are symmetrical with no cyanosis, clubbing, or edema. Skin: warm and dry with no masses, lesions, or rashes Psych: A&Ox3 with an  appropriate affect.  Results for orders placed or performed during the hospital encounter of 11/17/15 (from the past 48 hour(s))  CBC with Differential     Status: None   Collection Time: 11/17/15 12:17 AM  Result Value Ref Range   WBC 7.0 4.0 - 10.5 K/uL   RBC 4.55 3.87 - 5.11 MIL/uL   Hemoglobin 13.0 12.0 - 15.0 g/dL   HCT 39.7 36.0 - 46.0 %   MCV 87.3 78.0 - 100.0 fL   MCH 28.6 26.0 - 34.0 pg   MCHC 32.7 30.0 - 36.0 g/dL   RDW 14.6 11.5 - 15.5 %   Platelets 225 150 - 400 K/uL   Neutrophils Relative % 50 %   Neutro Abs 3.5 1.7 - 7.7 K/uL   Lymphocytes Relative 41 %   Lymphs Abs 2.9 0.7 - 4.0 K/uL   Monocytes Relative 7 %   Monocytes Absolute 0.5 0.1 - 1.0 K/uL   Eosinophils Relative 2 %   Eosinophils Absolute 0.1 0.0 - 0.7 K/uL   Basophils Relative 0 %   Basophils Absolute 0.0 0.0 - 0.1 K/uL  Comprehensive metabolic panel     Status: Abnormal   Collection Time: 11/17/15 12:17 AM  Result Value Ref Range   Sodium 135 135 - 145 mmol/L   Potassium 3.4 (L) 3.5 - 5.1 mmol/L   Chloride 100 (L) 101 - 111 mmol/L   CO2 23 22 - 32 mmol/L   Glucose, Bld 103 (H) 65 - 99 mg/dL   BUN 6 6 - 20 mg/dL    Creatinine, Ser 0.87 0.44 - 1.00 mg/dL   Calcium 9.9 8.9 - 10.3 mg/dL   Total Protein 7.4 6.5 - 8.1 g/dL   Albumin 4.0 3.5 - 5.0 g/dL   AST 37 15 - 41 U/L   ALT 19 14 - 54 U/L   Alkaline Phosphatase 71 38 - 126 U/L   Total Bilirubin 0.8 0.3 - 1.2 mg/dL   GFR calc non Af Amer >60 >60 mL/min   GFR calc Af Amer >60 >60 mL/min    Comment: (NOTE) The eGFR has been calculated using the CKD EPI equation. This calculation has not been validated in all clinical situations. eGFR's persistently <60 mL/min signify possible Chronic Kidney Disease.    Anion gap 12 5 - 15  Urinalysis, Routine w reflex microscopic (not at [facility])     Status: Abnormal   Collection Time: 11/17/15  8:52 AM  Result Value Ref Range   Color, Urine YELLOW YELLOW   APPearance CLEAR CLEAR   Specific Gravity, Urine <1.005 (L) 1.005 - 1.030   pH 5.5 5.0 - 8.0   Glucose, UA NEGATIVE NEGATIVE mg/dL   Hgb urine dipstick NEGATIVE NEGATIVE   Bilirubin Urine NEGATIVE NEGATIVE   Ketones, ur NEGATIVE NEGATIVE mg/dL   Protein, ur NEGATIVE NEGATIVE mg/dL   Nitrite NEGATIVE NEGATIVE   Leukocytes, UA SMALL (A) NEGATIVE  Urine microscopic-add on     Status: Abnormal   Collection Time: 11/17/15  8:52 AM  Result Value Ref Range   Squamous Epithelial / LPF 0-5 (A) NONE SEEN   WBC, UA 0-5 0 - 5 WBC/hpf   RBC / HPF NONE SEEN 0 - 5 RBC/hpf   Bacteria, UA RARE (A) NONE SEEN   Dg Knee 2 Views Right  Result Date: 11/17/2015 CLINICAL DATA:  Acute onset of pain and swelling at the right knee. Initial encounter. EXAM: RIGHT KNEE - 1-2 VIEW COMPARISON:  Right knee radiographs performed 06/02/2015 FINDINGS: There is  no evidence of fracture or dislocation. 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 A small knee joint effusion is noted. No definite soft tissue abnormalities are otherwise seen. IMPRESSION: 1. No evidence of fracture or dislocation. Total knee arthroplasty is grossly  unremarkable in appearance, without evidence of loosening. 2. Small knee joint effusion noted. Electronically Signed   By: Garald Balding M.D.   On: 11/17/2015 01:22   Ct Abdomen Pelvis W Contrast  Result Date: 11/17/2015 CLINICAL DATA:  55 year old female with abdominal pain. Evaluate for obstruction. EXAM: CT ABDOMEN AND PELVIS WITH CONTRAST TECHNIQUE: Multidetector CT imaging of the abdomen and pelvis was performed using the standard protocol following bolus administration of intravenous contrast. CONTRAST:  19m ISOVUE-300 IOPAMIDOL (ISOVUE-300) INJECTION 61% COMPARISON:  Abdominal radiographs dated 11/17/2015 and CT dated 08/14/2015 FINDINGS: Lower chest: Minimal bibasilar haziness, likely atelectatic changes. The visualized lung bases are otherwise clear. No intra-abdominal free air or free fluid. Hepatobiliary: Probable mild fatty infiltration of the liver. No intrahepatic biliary ductal dilatation. There is a 1.8 cm stone or clusters of smaller stones in the gallbladder neck. No pericholecystic fluid. Pancreas: Unremarkable. No pancreatic ductal dilatation or surrounding inflammatory changes. Spleen: Normal in size without focal abnormality. Adrenals/Urinary Tract: Adrenal glands are unremarkable. Kidneys are normal, without renal calculi, focal lesion, or hydronephrosis. Bladder is unremarkable. Stomach/Bowel: There is postsurgical changes of the anterior abdominal wall with a midline vertical anterior abdominal wall incisional scar. There is abutment of loops of small bowel to the anterior peritoneal wall compatible with adhesions. An air distended loop of distal small bowel with air-fluid level noted in the mid and lower abdomen anteriorly as seen on the prior CTs. This may represent an abnormal chronically dilated segment of bowel versus a segment of bowel with functional dysmotility. A degree of underlying bowel obstruction is less likely but not excluded. There is no active bowel inflammation.  Vascular/Lymphatic: There is mild aortoiliac atherosclerotic disease. The origins of the celiac axis, SMA, IMA as well as the origins of the renal arteries are patent. No portal venous gas identified. There is no adenopathy. Reproductive: Hysterectomy. Other: None Musculoskeletal: No acute or significant osseous findings. IMPRESSION: Chronically dilated air-filled distal small bowel loop in the mid and lower abdomen may represent chronic ileus or related to functional dysmotility. A degree of small-bowel obstruction is not entirely excluded. There is postsurgical changes of anterior abdominal wall with adhesion of this loop of bowel to the anterior peritoneum. Cholelithiasis. Electronically Signed   By: AAnner CreteM.D.   On: 11/17/2015 06:17   Dg Abdomen Acute W/chest  Result Date: 11/17/2015 CLINICAL DATA:  Acute onset of generalized abdominal pain and nausea. Initial encounter. EXAM: DG ABDOMEN ACUTE W/ 1V CHEST COMPARISON:  Chest and abdominal radiographs performed 08/14/2015 FINDINGS: The lungs are well-aerated and clear. There is no evidence of focal opacification, pleural effusion or pneumothorax. The cardiomediastinal silhouette is within normal limits. There is diffuse dilatation of the colon, measuring up to 13.8 cm in size. This may reflect a cecal volvulus. Remaining bowel loops are grossly unremarkable. No free intra-abdominal air is identified on the provided upright view. No acute osseous abnormalities are seen; the sacroiliac joints are unremarkable in appearance. IMPRESSION: Diffuse dilatation of the colon, measuring up to 13.8 cm in size. This may reflect a cecal volvulus. Would correlate with the patient's symptoms, and evaluate further as deemed clinically appropriate. No free intra-abdominal air seen. Electronically Signed   By: JJacqulynn Cadet  Chang M.D.   On: 11/17/2015 01:20   Dg Abd Portable 1 View  Result Date: 11/17/2015 CLINICAL DATA:  55 year old female with enteric tube placement.  EXAM: PORTABLE ABDOMEN - 1 VIEW COMPARISON:  Abdominal radiograph dated 11/17/2015 FINDINGS: There has been interval placement of the partially visualized enteric tube with tip in the left upper abdomen likely within the stomach. Air distended loop of bowel likely transverse colon measures up to 11 cm in diameter. Another air distended loop of bowel in the right hemipelvis noted which may represent the cecum. This findings are relatively similar to the CT dated 08/14/2015. The descending colon demonstrates a normal caliber. No free air identified. No radiopaque calculi. The osseous structures are grossly unremarkable. IMPRESSION: Interval placement of an enteric tube with tip in the left upper abdomen likely in the stomach. Air distended cecum and transverse colon similar in appearance to the CT dated 08/14/2015. Electronically Signed   By: Anner Crete M.D.   On: 11/17/2015 04:55      Assessment/Plan Abdominal pain with nausea and vomiting SBO vs chronic ileus or functional dysmotility - history of multiple abdominal surgeries, and recurrent SBO's. Most recent admission 08/14/15. - CT scan shows chronically dilated air-filled distal small bowel loop in the mid and lower abdomen may represent chronic ileus or related to functional dysmotility. A degree of small-bowel obstruction is not entirely excluded. - no BM is >1 week, +flatus this a.m. - continue NPO, NGT, antiemetics  GERD COPD HTN HLD Anxiety/Depression  ID - none VTE - lovenox FEN - NPO, IVF  Plan - continue NPO and NGT. New NGT just placed so will repeat XR for placement. +flatus. If not improving tomorrow/passing minimal flatus will plan to give gastrograffin and place on small bowel protocol.   Jerrye Beavers, [facility] Surgery 11/17/2015, 12:06 PM Pager: [phone] Consults: [phone] Mon-Fri 7:00 am-4:30 pm Sat-Sun 7:00 am-11:30 am\

## 2015-11-17 NOTE — ED Provider Notes (Signed)
[facility]-EMERGENCY DEPT Provider Note   CSN: [id number] Arrival date & time: 11/17/15  0001     History   Chief Complaint Chief Complaint  Patient presents with  . Abdominal Pain  . Knee Pain    HPI [name] is a 55 y.o. female with a hx of asthma, abdominal hysterectomy with recurrent SBO, chronic low back pain, CPOD, depression, GERD, HTN presents to the Emergency Department complaining of gradual, persistent, progressively worsening generalized abd pain onset 2 days ago.  Pt reports this is the same as previous episodes of SBO.  She reports last BM was 1 week ago and last flatus was this morning.  Pt reports nausea without vomiting.  No aggravating or alleviating factors.  She denies fever, chills.    She also reports right knee pain.  She reports she twisted it this morning after stumbling on a toy.  She reports moderate pain since that time, but has been able to walk.  She reports hx to total knee arthroplasty without complication years ago.  No treatments PTA.  No numbness, tingling or weakness.     The history is provided by the patient and medical records. No language interpreter was used.    Past Medical History:  Diagnosis Date  . Asthma   . Cataract   . Chronic bronchitis (HCC)   . Chronic lower back pain   . COPD (chronic obstructive pulmonary disease) (HCC)   . Depression   . GERD (gastroesophageal reflux disease)   . Heart murmur   . History of blood transfusion 2003-2004   "related to bowel obstruction OR"  . Hyperlipidemia   . Hypertension   . Insomnia   . Osteoarthritis    "lower back; both hips; right leg" (08/01/2014)    Patient Active Problem List   Diagnosis Date Noted  . SBO (small bowel obstruction) (HCC) 08/14/2015  . Hypokalemia 08/14/2015  . Abdominal pain, lower   . Epigastric pain   . Partial small bowel obstruction (HCC) 03/22/2014  . Abdominal pain 03/22/2014  . COPD (chronic obstructive pulmonary disease) (HCC)   . Hypertension   .  GERD (gastroesophageal reflux disease)   . Hyperlipidemia   . Osteoarthritis     Past Surgical History:  Procedure Laterality Date  . ABDOMINAL EXPLORATION SURGERY  2004   abdominal compartment syndrome  . ABDOMINAL EXPLORATION SURGERY  2003   small bowel prolapse through vagina  . ABDOMINAL HYSTERECTOMY  2003  . ADENOIDECTOMY  2002  . BOWEL RESECTION  2003; 2004   x2 with primary anastamosis  . COMBINED HYSTEROSCOPY DIAGNOSTIC / D&C  ~ 2003  . HERNIA REPAIR    . JOINT REPLACEMENT    . LAPAROSCOPIC INCISIONAL / UMBILICAL / VENTRAL HERNIA REPAIR  2012   [facility], Dr. Carolynn SayersWescott  . TONSILLECTOMY  1977  . TOTAL KNEE ARTHROPLASTY Right 03/2012   [facility]    OB History    No data available       Home Medications    Prior to Admission medications   Medication Sig Start Date End Date Taking? Authorizing Provider  albuterol (PROVENTIL HFA;VENTOLIN HFA) 108 (90 BASE) MCG/ACT inhaler Inhale 1 puff into the lungs every 6 (six) hours as needed for wheezing or shortness of breath (wheezing).    Yes Historical Provider, MD  amLODipine (NORVASC) 10 MG tablet Take 10 mg by mouth daily.   Yes Historical Provider, MD  diclofenac sodium (VOLTAREN) 1 % GEL Apply 2 g topically 4 (four) times daily as needed (  knee pain).    Yes Historical Provider, MD  diphenhydrAMINE (BENADRYL) 25 MG tablet Take 25 mg by mouth every 6 (six) hours as needed for allergies (allergies).   Yes Historical Provider, MD  esomeprazole (NEXIUM) 40 MG capsule Take 40 mg by mouth daily at 12 noon.   Yes Historical Provider, MD  Fluticasone-Salmeterol (ADVAIR) 500-50 MCG/DOSE AEPB Inhale 1 puff into the lungs 2 (two) times daily.   Yes Historical Provider, MD  gabapentin (NEURONTIN) 300 MG capsule Take 300 mg by mouth 3 (three) times daily.   Yes Historical Provider, MD  lisinopril (PRINIVIL,ZESTRIL) 20 MG tablet Take 20 mg by mouth daily.   Yes Historical Provider, MD  loratadine (CLARITIN) 10 MG tablet Take 10 mg by mouth daily.    Yes Historical Provider, MD  LORazepam (ATIVAN) 0.5 MG tablet Take 0.5 mg by mouth every 6 (six) hours as needed for anxiety (anxiety).    Yes Historical Provider, MD  meloxicam (MOBIC) 15 MG tablet Take 15 mg by mouth daily as needed for pain.  09/18/14  Yes Historical Provider, MD  mometasone (NASONEX) 50 MCG/ACT nasal spray USE 1 PUFF IN THE NOSTRILS TWICE A DAY AS NEEDED FOR CONGESTION 07/14/14  Yes Historical Provider, MD  Multiple Vitamin (MULTIVITAMIN WITH MINERALS) TABS tablet Take 1 tablet by mouth daily.   Yes Historical Provider, MD  polyethylene glycol (MIRALAX / GLYCOLAX) packet Take 17 g by mouth daily as needed for mild constipation or moderate constipation (constipation).    Yes Historical Provider, MD  promethazine (PHENERGAN) 25 MG tablet Take 25 mg by mouth every 8 (eight) hours as needed for nausea or vomiting (nausea).  08/20/13  Yes Historical Provider, MD  rosuvastatin (CRESTOR) 10 MG tablet Take 10 mg by mouth daily.   Yes Historical Provider, MD  sertraline (ZOLOFT) 100 MG tablet Take 100 mg by mouth daily.   Yes Historical Provider, MD  tiotropium (SPIRIVA) 18 MCG inhalation capsule Place 18 mcg into inhaler and inhale daily.   Yes Historical Provider, MD  traMADol (ULTRAM) 50 MG tablet Take 1 tablet (50 mg total) by mouth every 6 (six) hours as needed. 06/02/15  Yes Samantha Tripp Dowless, PA-C  traZODone (DESYREL) 100 MG tablet Take 100 mg by mouth at bedtime as needed for sleep.  06/09/14  Yes Historical Provider, MD  triamterene-hydrochlorothiazide (MAXZIDE-25) 37.5-25 MG tablet Take 1 tablet by mouth daily. 11/06/14  Yes Historical Provider, MD  bisacodyl (DULCOLAX) 10 MG suppository Place 1 suppository (10 mg total) rectally daily as needed for moderate constipation. Patient not taking: Reported on 11/17/2015 08/03/14   [name], MD  ipratropium (ATROVENT) 0.06 % nasal spray Place 2 sprays into the nose 4 (four) times daily. Patient not taking: Reported on 11/17/2015  05/05/13   Linna Hoff, MD  methocarbamol (ROBAXIN) 500 MG tablet Take 1 tablet (500 mg total) by mouth 2 (two) times daily as needed (back pain). Patient not taking: Reported on 11/17/2015 03/04/15   Carlene Coria, PA-C    Family History Family History  Problem Relation Age of Onset  . Cancer Mother   . Heart attack Father   . Colon cancer Maternal Grandmother   . Colon cancer Paternal Grandmother     Social History Social History  Substance Use Topics  . Smoking status: Never Smoker  . Smokeless tobacco: Never Used  . Alcohol use No     Allergies   Bee venom; Peanuts [peanut oil]; Shellfish allergy; Aspirin; Corn-containing products; Ibuprofen; Latex; Morphine and  related; and Zofran [ondansetron hcl]   Review of Systems Review of Systems  Gastrointestinal: Positive for abdominal distention, abdominal pain, constipation and nausea.  Musculoskeletal: Positive for arthralgias (right).     Physical Exam Updated Vital Signs BP 102/85   Pulse 101   Temp 98.3 F (36.8 C) (Oral)   Resp 18   Ht 5\' 2"  (1.575 m)   Wt 75.8 kg   SpO2 100%   BMI 30.54 kg/m   Physical Exam  Constitutional: She appears well-developed and well-nourished. No distress.  Awake, alert, nontoxic appearance  HENT:  Head: Normocephalic and atraumatic.  Mouth/Throat: Oropharynx is clear and moist. No oropharyngeal exudate.  Eyes: Conjunctivae are normal. No scleral icterus.  Neck: Normal range of motion. Neck supple.  Cardiovascular: Normal rate, regular rhythm and intact distal pulses.   Capillary refill < 3 sec  Pulmonary/Chest: Effort normal and breath sounds normal. No respiratory distress. She has no wheezes.  Equal chest expansion  Abdominal: Soft. Bowel sounds are normal. She exhibits distension. She exhibits no mass. There is tenderness. There is guarding. There is no rebound and no CVA tenderness.  Well healed surgical incisions  Musculoskeletal: Normal range of motion. She exhibits  tenderness. She exhibits no edema.  Right knee: well healed surgical incision. Slightly decreased ROM.  Knee is generally tender with associated joint line tenderness as well.  Minimal swelling, no erythema or increased warmth.    Neurological: She is alert. Coordination normal.  Speech is clear and goal oriented Moves extremities without ataxia  Skin: Skin is warm and dry. She is not diaphoretic.  No tenting of the skin  Psychiatric: She has a normal mood and affect.  Nursing note and vitals reviewed.    ED Treatments / Results  Labs (all labs ordered are listed, but only abnormal results are displayed) Labs Reviewed  COMPREHENSIVE METABOLIC PANEL - Abnormal; Notable for the following:       Result Value   Potassium 3.4 (*)    Chloride 100 (*)    Glucose, Bld 103 (*)    All other components within normal limits  CBC WITH DIFFERENTIAL/PLATELET  URINALYSIS, ROUTINE W REFLEX MICROSCOPIC (NOT AT [facility])    Radiology Dg Knee 2 Views Right  Result Date: 11/17/2015 CLINICAL DATA:  Acute onset of pain and swelling at the right knee. Initial encounter. EXAM: RIGHT KNEE - 1-2 VIEW COMPARISON:  Right knee radiographs performed 06/02/2015 FINDINGS: There is no evidence of fracture or dislocation. 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 A small knee joint effusion is noted. No definite soft tissue abnormalities are otherwise seen. IMPRESSION: 1. No evidence of fracture or dislocation. Total knee arthroplasty is grossly unremarkable in appearance, without evidence of loosening. 2. Small knee joint effusion noted. Electronically Signed   By: Roanna Raider M.D.   On: 11/17/2015 01:22   Ct Abdomen Pelvis W Contrast  Result Date: 11/17/2015 CLINICAL DATA:  55 year old female with abdominal pain. Evaluate for obstruction. EXAM: CT ABDOMEN AND PELVIS WITH CONTRAST TECHNIQUE: Multidetector CT imaging of the abdomen and pelvis was  performed using the standard protocol following bolus administration of intravenous contrast. CONTRAST:  ISOVUE-300 IOPAMIDOL (ISOVUE-300) INJECTION 61% COMPARISON:  Abdominal radiographs dated 11/17/2015 and CT dated 08/14/2015 FINDINGS: Lower chest: Minimal bibasilar haziness, likely atelectatic changes. The visualized lung bases are otherwise clear. No intra-abdominal free air or free fluid. Hepatobiliary: Probable mild fatty infiltration of the liver. No intrahepatic biliary ductal dilatation. There is  a 1.8 cm stone or clusters of smaller stones in the gallbladder neck. No pericholecystic fluid. Pancreas: Unremarkable. No pancreatic ductal dilatation or surrounding inflammatory changes. Spleen: Normal in size without focal abnormality. Adrenals/Urinary Tract: Adrenal glands are unremarkable. Kidneys are normal, without renal calculi, focal lesion, or hydronephrosis. Bladder is unremarkable. Stomach/Bowel: There is postsurgical changes of the anterior abdominal wall with a midline vertical anterior abdominal wall incisional scar. There is abutment of loops of small bowel to the anterior peritoneal wall compatible with adhesions. An air distended loop of distal small bowel with air-fluid level noted in the mid and lower abdomen anteriorly as seen on the prior CTs. This may represent an abnormal chronically dilated segment of bowel versus a segment of bowel with functional dysmotility. A degree of underlying bowel obstruction is less likely but not excluded. There is no active bowel inflammation. Vascular/Lymphatic: There is mild aortoiliac atherosclerotic disease. The origins of the celiac axis, SMA, IMA as well as the origins of the renal arteries are patent. No portal venous gas identified. There is no adenopathy. Reproductive: Hysterectomy. Other: None Musculoskeletal: No acute or significant osseous findings. IMPRESSION: Chronically dilated air-filled distal small bowel loop in the mid and lower abdomen  may represent chronic ileus or related to functional dysmotility. A degree of small-bowel obstruction is not entirely excluded. There is postsurgical changes of anterior abdominal wall with adhesion of this loop of bowel to the anterior peritoneum. Cholelithiasis. Electronically Signed   By: Elgie Collard M.D.   On: 11/17/2015 06:17   Dg Abdomen Acute W/chest  Result Date: 11/17/2015 CLINICAL DATA:  Acute onset of generalized abdominal pain and nausea. Initial encounter. EXAM: DG ABDOMEN ACUTE W/ 1V CHEST COMPARISON:  Chest and abdominal radiographs performed 08/14/2015 FINDINGS: The lungs are well-aerated and clear. There is no evidence of focal opacification, pleural effusion or pneumothorax. The cardiomediastinal silhouette is within normal limits. There is diffuse dilatation of the colon, measuring up to 13.8 cm in size. This may reflect a cecal volvulus. Remaining bowel loops are grossly unremarkable. No free intra-abdominal air is identified on the provided upright view. No acute osseous abnormalities are seen; the sacroiliac joints are unremarkable in appearance. IMPRESSION: Diffuse dilatation of the colon, measuring up to 13.8 cm in size. This may reflect a cecal volvulus. Would correlate with the patient's symptoms, and evaluate further as deemed clinically appropriate. No free intra-abdominal air seen. Electronically Signed   By: Roanna Raider M.D.   On: 11/17/2015 01:20   Dg Abd Portable 1 View  Result Date: 11/17/2015 CLINICAL DATA:  54 year old female with enteric tube placement. EXAM: PORTABLE ABDOMEN - 1 VIEW COMPARISON:  Abdominal radiograph dated 11/17/2015 FINDINGS: There has been interval placement of the partially visualized enteric tube with tip in the left upper abdomen likely within the stomach. Air distended loop of bowel likely transverse colon measures up to 11 cm in diameter. Another air distended loop of bowel in the right hemipelvis noted which may represent the cecum. This  findings are relatively similar to the CT dated 08/14/2015. The descending colon demonstrates a normal caliber. No free air identified. No radiopaque calculi. The osseous structures are grossly unremarkable. IMPRESSION: Interval placement of an enteric tube with tip in the left upper abdomen likely in the stomach. Air distended cecum and transverse colon similar in appearance to the CT dated 08/14/2015. Electronically Signed   By: Elgie Collard M.D.   On: 11/17/2015 04:55    Procedures Procedures (including critical care time)  Medications Ordered  in ED Medications  HYDROmorphone (DILAUDID) injection 0.5 mg (not administered)  sodium chloride 0.9 % bolus 1,000 mL (0 mLs Intravenous Stopped 11/17/15 0437)  iopamidol (ISOVUE-300) 61 % injection (100 mLs  Contrast Given 11/17/15 0536)  HYDROmorphone (DILAUDID) injection 0.5 mg (0.5 mg Intravenous Given 11/17/15 0404)  promethazine (PHENERGAN) injection 12.5 mg (12.5 mg Intravenous Given 11/17/15 0404)     Initial Impression / Assessment and Plan / ED Course  I have reviewed the triage vital signs and the nursing notes.  Pertinent labs & imaging results that were available during my care of the patient were reviewed by me and considered in my medical decision making (see chart for details).  Clinical Course  Value Comment By Time  Potassium: (!) 3.4 Mild hypokalemia Dierdre Forth, PA-C 09/25 0401  WBC: 7.0 No leukocytosis Dierdre Forth, PA-C 09/25 0401  DG Knee 2 Views Right Small joint effusion but no fracture or dislocation. Dahlia Client Shailah Gibbins, PA-C 09/25 1610  DG Abdomen Acute W/Chest Dilated small bowel loops concerning for small bowel obstruction. Dierdre Forth, PA-C 09/25 [phone]  CT ABDOMEN PELVIS W CONTRAST Chronically dilated air-filled distal small bowel loop with a potential area of small bowel obstruction but there is no discrete transition point. Dierdre Forth, PA-C 09/25 5409   Discussed with Dr. Toniann Fail  who will admit Dierdre Forth, PA-C 09/25 8119   Discussed with Dr. Magnus Ivan who will consult Dierdre Forth, PA-C 09/25 [phone]    Pt with history of recurrent small bowel obstruction.  Patient with symptoms similar today. She has not required surgery for this but doesn't normally require an NG tube. This was placed with some improvement in her pain.  CT with Chronically dilated air-filled distal small bowel loop in the mid and lower abdomen may represent chronic ileus or related to functional dysmotility. A degree of small-bowel obstruction is not entirely excluded, there is no transition point. Patient will need medical admission for further monitoring.  Final Clinical Impressions(s) / ED Diagnoses   Final diagnoses:  Small bowel obstruction (HCC)  Right knee pain    New Prescriptions New Prescriptions   No medications on file     Dierdre Forth, PA-C 11/17/15 2956    Shon Baton, MD 11/17/15 [phone]

## 2015-11-17 NOTE — ED Notes (Signed)
Report attempted, RN to call back. 

## 2015-11-18 ENCOUNTER — Inpatient Hospital Stay (HOSPITAL_COMMUNITY): Payer: Medicare HMO

## 2015-11-18 DIAGNOSIS — E785 Hyperlipidemia, unspecified: Secondary | ICD-10-CM

## 2015-11-18 DIAGNOSIS — K56609 Unspecified intestinal obstruction, unspecified as to partial versus complete obstruction: Secondary | ICD-10-CM

## 2015-11-18 DIAGNOSIS — K8021 Calculus of gallbladder without cholecystitis with obstruction: Secondary | ICD-10-CM

## 2015-11-18 DIAGNOSIS — E876 Hypokalemia: Secondary | ICD-10-CM

## 2015-11-18 LAB — COMPREHENSIVE METABOLIC PANEL
ALBUMIN: 3.1 g/dL — AB (ref 3.5–5.0)
ALT: 16 U/L (ref 14–54)
ANION GAP: 10 (ref 5–15)
AST: 26 U/L (ref 15–41)
Alkaline Phosphatase: 58 U/L (ref 38–126)
BUN: 5 mg/dL — ABNORMAL LOW (ref 6–20)
CHLORIDE: 111 mmol/L (ref 101–111)
CO2: 22 mmol/L (ref 22–32)
CREATININE: 0.76 mg/dL (ref 0.44–1.00)
Calcium: 8.9 mg/dL (ref 8.9–10.3)
GFR calc non Af Amer: >60 mL/min (ref >60)
Glucose, Bld: 62 mg/dL — ABNORMAL LOW (ref 65–99)
Potassium: 3.6 mmol/L (ref 3.5–5.1)
SODIUM: 143 mmol/L (ref 135–145)
Total Bilirubin: 0.9 mg/dL (ref 0.3–1.2)
Total Protein: 5.2 g/dL — ABNORMAL LOW (ref 6.5–8.1)

## 2015-11-18 LAB — CBC
HCT: 36.7 % (ref 36.0–46.0)
HEMOGLOBIN: 11.5 g/dL — AB (ref 12.0–15.0)
MCH: 28.3 pg (ref 26.0–34.0)
MCHC: 31.3 g/dL (ref 30.0–36.0)
MCV: 90.4 fL (ref 78.0–100.0)
Platelets: 164 10*3/uL (ref 150–400)
RBC: 4.06 MIL/uL (ref 3.87–5.11)
RDW: 15 % (ref 11.5–15.5)
WBC: 4.5 10*3/uL (ref 4.0–10.5)

## 2015-11-18 MED ORDER — DIATRIZOATE MEGLUMINE & SODIUM 66-10 % PO SOLN
90.0000 mL | Freq: Once | ORAL | Status: AC
Start: 2015-11-18 — End: 2015-11-18
  Administered 2015-11-18: 90 mL via NASOGASTRIC
  Filled 2015-11-18: qty 90

## 2015-11-18 MED ORDER — HYDROMORPHONE HCL 1 MG/ML IJ SOLN
0.5000 mg | INTRAMUSCULAR | Status: DC | PRN
  Administered 2015-11-18 – 2015-11-22 (×10): 0.5 mg via INTRAVENOUS
  Filled 2015-11-18 (×10): qty 1

## 2015-11-18 NOTE — Progress Notes (Signed)
NPO maint, Gastrografin given thru NGT at 1200 and clamped for 1 hr. Radiology notified.

## 2015-11-18 NOTE — Progress Notes (Signed)
PROGRESS NOTE    [name]  UJW:[id number] DOB: [birth year] DOA: 11/17/2015 PCP: Pcp Not In System     Brief Narrative:   55 y.o. BF PMHx Depression, Asthma, Chronic Bronchitis, COPD, Gerd, HLD, HTN,, OA,  SBO related to postsurgical changes in 2003, after her hysterectomy, with recurrent admissions.Her last visit was on 08/14/2015.   Presents with acute onset of abdominal pain since last evening, epigastric, radiating to the lower quadrant, sharp, 9 out of 10. This is associated with nausea, and intermittent vomiting. The patient has not been able to eat or keep medications down. Her last bowel movement was 3 to 4 days prior to admission. No diarrhea. No food poisoning. No recent long distance trips. No sick contacts. Denies fevers, chills, night sweats, vision changes, or mucositis. Denies any respiratory complaints. Denies any chest pain or palpitations. Denies lower extremity swelling.   Denies any dysuria. Denies abnormal skin rashes, or neuropathy. Denies any bleeding issues such as epistaxis, hematemesis, hematuria or hematochezia. Ambulating without difficulty.   ED Course:  BP 102/85   Pulse 101   Temp 98.3 F (36.8 C) (Oral)   Resp 18   Ht 5\' 2"  (1.575 m)   Wt 75.8 kg (167 lb)   SpO2 100%   BMI 30.54 kg/m    CT of the abdomen and pelvis shows Chronically dilated air-filled distal small bowel loop in the mid and lower abdomen may represent chronic ileus or related to functional dysmotility. A degree of small-bowel obstruction is not entirely excluded. There is postsurgical changes of anterior abdominal wall with adhesion of this loop of bowel to the anterior peritoneum. Cholelithiasis  WBC normal at 7  potassium 3.4  LFTs normal    Subjective: 9/26 A/O 4, NAD   Assessment & Plan:   Principal Problem:   SBO (small bowel obstruction) (HCC) Active Problems:   Hypertension   GERD (gastroesophageal reflux disease)   Hyperlipidemia   Hypokalemia   Essential  hypertension   Calculus of gallbladder with biliary obstruction but without cholecystitis   Small bowel obstruction (HCC)   Cholelithiasis -CT scan shows large stone in the gallbladder neck see results below -Cholecystectomy? Await CCS decision  SBO vs chronic ileus or functional dysmotility - history of multiple abdominal surgeries, and recurrent SBO's. Most recent admission 08/14/15. - CT scan shows chronically dilated air-filled distal small bowel loop in the mid and lower abdomen may represent chronic ileus or related to functional dysmotility. A degree of small-bowel obstruction is not entirely excluded. - no flatus today - will start small bowel protocol - continue NPO, NGT, antiemetics  Abdominal pain  -Resolved with placement of NG tube to suction  -Chronic Small Bowel Obstruction per CT abdomen, chronic in nature. .  -Bowel rest /NPO: Await surgery recommendations -Normal saline 75 ml/hr   GERD,  -no acute symptoms: -Continue PPI  COPD not on exacerbation -Albuterol nebulizer PRN -Dulera 200-5 micrograms BID -Spiriva 18 g daily  Hypertension BP 102/85   Pulse 101   -Amlodipine 10 mg daily -Hydralazine PRN -Lisinopril 20 mg daily   Anxiety/Depression -Ativan PRN -Zoloft 100 mg daily      DVT prophylaxis: Lovenox Code Status: Full Family Communication: None Disposition Plan: Per surgery   Consultants:  Dr.Faera Byerly CCS    Procedures/Significant Events:  9/25 CT abdomen pelvis W contrast:-Chronically dilated air-filled distal small bowel loop in the mid and lower abdomen may represent chronic ileus or related to functional dysmotility.  - There is postsurgical changes of  anterior abdominal wall with adhesion of this loop of bowel to the anterior peritoneum. -Cholelithiasis.1.8 cm stone or clusters of smaller stones in the gallbladder neck.   Cultures Negative   Antimicrobials: None   Devices None   LINES / TUBES:       Continuous Infusions: . sodium chloride 125 mL/hr at 11/18/15 1621     Objective: Vitals:   11/17/15 1936 11/17/15 2209 11/18/15 0504 11/18/15 1348  BP:  121/75 138/71 117/78  Pulse: 86 90 86 79  Resp: (!) 1 17 16 16   Temp:  97.5 F (36.4 C) 98.2 F (36.8 C) 98.2 F (36.8 C)  TempSrc:  Oral Oral Oral  SpO2: 98% 98% 96% 100%  Weight:      Height:        Intake/Output Summary (Last 24 hours) at 11/18/15 1844 Last data filed at 11/18/15 1837  Gross per 24 hour  Intake             3730 ml  Output             1550 ml  Net             2180 ml   Filed Weights   11/17/15 0008  Weight: 75.8 kg (167 lb)    Examination:  General: A/O 4, NAD, No acute respiratory distress Eyes: negative scleral hemorrhage, negative anisocoria, negative icterus ENT: Negative Runny nose, negative gingival bleeding, NG tube present to suction positive bilious fluid in canister Neck:  Negative scars, masses, torticollis, lymphadenopathy, JVD Lungs: Clear to auscultation bilaterally without wheezes or crackles Cardiovascular: Regular rate and rhythm without murmur gallop or rub normal S1 and S2 Abdomen: negative abdominal pain, nondistended, positive soft, bowel sounds, no rebound, no ascites, no appreciable mass Extremities: No significant cyanosis, clubbing, or edema bilateral lower extremities Skin: Negative rashes, lesions, ulcers Psychiatric:  Negative depression, negative anxiety, negative fatigue, negative mania  Central nervous system:  Cranial nerves II through XII intact, tongue/uvula midline, all extremities muscle strength 5/5, sensation intact throughout, negative dysarthria, negative expressive aphasia, negative receptive aphasia.  .     Data Reviewed: Care during the described time interval was provided by me .  I have reviewed this patient's available data, including medical history, events of note, physical examination, and all test results as part of my evaluation. I  have personally reviewed and interpreted all radiology studies.  CBC:  Recent Labs Lab 11/17/15 0017 11/18/15 0443  WBC 7.0 4.5  NEUTROABS 3.5  --   HGB 13.0 11.5*  HCT 39.7 36.7  MCV 87.3 90.4  PLT 225 164   Basic Metabolic Panel:  Recent Labs Lab 11/17/15 0017 11/18/15 0443  NA 135 143  K 3.4* 3.6  CL 100* 111  CO2 23 22  GLUCOSE 103* 62*  BUN 6 5*  CREATININE 0.87 0.76  CALCIUM 9.9 8.9   GFR: Estimated Creatinine Clearance: 76.7 mL/min (by C-G formula based on SCr of 0.76 mg/dL). Liver Function Tests:  Recent Labs Lab 11/17/15 0017 11/18/15 0443  AST 37 26  ALT 19 16  ALKPHOS 71 58  BILITOT 0.8 0.9  PROT 7.4 5.2*  ALBUMIN 4.0 3.1*   No results for input(s): LIPASE, AMYLASE in the last 168 hours. No results for input(s): AMMONIA in the last 168 hours. Coagulation Profile: No results for input(s): INR, PROTIME in the last 168 hours. Cardiac Enzymes: No results for input(s): CKTOTAL, CKMB, CKMBINDEX, TROPONINI in the last 168 hours. BNP (last 3 results) No results  for input(s): PROBNP in the last 8760 hours. HbA1C: No results for input(s): HGBA1C in the last 72 hours. CBG: No results for input(s): GLUCAP in the last 168 hours. Lipid Profile: No results for input(s): CHOL, HDL, LDLCALC, TRIG, CHOLHDL, LDLDIRECT in the last 72 hours. Thyroid Function Tests: No results for input(s): TSH, T4TOTAL, FREET4, T3FREE, THYROIDAB in the last 72 hours. Anemia Panel: No results for input(s): VITAMINB12, FOLATE, FERRITIN, TIBC, IRON, RETICCTPCT in the last 72 hours. Sepsis Labs: No results for input(s): PROCALCITON, LATICACIDVEN in the last 168 hours.  No results found for this or any previous visit (from the past 240 hour(s)).       Radiology Studies: Dg Knee 2 Views Right  Result Date: 11/17/2015 CLINICAL DATA:  Acute onset of pain and swelling at the right knee. Initial encounter. EXAM: RIGHT KNEE - 1-2 VIEW COMPARISON:  Right knee radiographs performed  06/02/2015 FINDINGS: There is no evidence of fracture or dislocation. 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 A small knee joint effusion is noted. No definite soft tissue abnormalities are otherwise seen. IMPRESSION: 1. No evidence of fracture or dislocation. Total knee arthroplasty is grossly unremarkable in appearance, without evidence of loosening. 2. Small knee joint effusion noted. Electronically Signed   By: Roanna Raider M.D.   On: 11/17/2015 01:22   Ct Abdomen Pelvis W Contrast  Result Date: 11/17/2015 CLINICAL DATA:  55 year old female with abdominal pain. Evaluate for obstruction. EXAM: CT ABDOMEN AND PELVIS WITH CONTRAST TECHNIQUE: Multidetector CT imaging of the abdomen and pelvis was performed using the standard protocol following bolus administration of intravenous contrast. CONTRAST:  ISOVUE-300 IOPAMIDOL (ISOVUE-300) INJECTION 61% COMPARISON:  Abdominal radiographs dated 11/17/2015 and CT dated 08/14/2015 FINDINGS: Lower chest: Minimal bibasilar haziness, likely atelectatic changes. The visualized lung bases are otherwise clear. No intra-abdominal free air or free fluid. Hepatobiliary: Probable mild fatty infiltration of the liver. No intrahepatic biliary ductal dilatation. There is a 1.8 cm stone or clusters of smaller stones in the gallbladder neck. No pericholecystic fluid. Pancreas: Unremarkable. No pancreatic ductal dilatation or surrounding inflammatory changes. Spleen: Normal in size without focal abnormality. Adrenals/Urinary Tract: Adrenal glands are unremarkable. Kidneys are normal, without renal calculi, focal lesion, or hydronephrosis. Bladder is unremarkable. Stomach/Bowel: There is postsurgical changes of the anterior abdominal wall with a midline vertical anterior abdominal wall incisional scar. There is abutment of loops of small bowel to the anterior peritoneal wall compatible with adhesions. An air  distended loop of distal small bowel with air-fluid level noted in the mid and lower abdomen anteriorly as seen on the prior CTs. This may represent an abnormal chronically dilated segment of bowel versus a segment of bowel with functional dysmotility. A degree of underlying bowel obstruction is less likely but not excluded. There is no active bowel inflammation. Vascular/Lymphatic: There is mild aortoiliac atherosclerotic disease. The origins of the celiac axis, SMA, IMA as well as the origins of the renal arteries are patent. No portal venous gas identified. There is no adenopathy. Reproductive: Hysterectomy. Other: None Musculoskeletal: No acute or significant osseous findings. IMPRESSION: Chronically dilated air-filled distal small bowel loop in the mid and lower abdomen may represent chronic ileus or related to functional dysmotility. A degree of small-bowel obstruction is not entirely excluded. There is postsurgical changes of anterior abdominal wall with adhesion of this loop of bowel to the anterior peritoneum. Cholelithiasis. Electronically Signed   By: Elgie Collard M.D.   On: 11/17/2015 06:17  Dg Abdomen Acute W/chest  Result Date: 11/17/2015 CLINICAL DATA:  Acute onset of generalized abdominal pain and nausea. Initial encounter. EXAM: DG ABDOMEN ACUTE W/ 1V CHEST COMPARISON:  Chest and abdominal radiographs performed 08/14/2015 FINDINGS: The lungs are well-aerated and clear. There is no evidence of focal opacification, pleural effusion or pneumothorax. The cardiomediastinal silhouette is within normal limits. There is diffuse dilatation of the colon, measuring up to 13.8 cm in size. This may reflect a cecal volvulus. Remaining bowel loops are grossly unremarkable. No free intra-abdominal air is identified on the provided upright view. No acute osseous abnormalities are seen; the sacroiliac joints are unremarkable in appearance. IMPRESSION: Diffuse dilatation of the colon, measuring up to 13.8 cm  in size. This may reflect a cecal volvulus. Would correlate with the patient's symptoms, and evaluate further as deemed clinically appropriate. No free intra-abdominal air seen. Electronically Signed   By: Roanna Raider M.D.   On: 11/17/2015 01:20   Dg Abd Portable 1v-small Bowel Protocol-position Verification  Result Date: 11/18/2015 CLINICAL DATA:  Bedside nasogastric tube placement. EXAM: PORTABLE ABDOMEN - 1 VIEW COMPARISON:  CT abdomen pelvis yesterday and earlier. Abdominal x-rays yesterday and earlier. FINDINGS: Nasogastric tube tip in the distal body of the stomach. The stomach is decompressed. Persistent marked gaseous distention of a loop of distal small bowel, likely post surgical atony based on the CT appearance. Bowel gas pattern otherwise unremarkable. IMPRESSION: 1. Nasogastric tube tip in the distal body of the stomach. 2. Chronic moderate gaseous distention of a loop of distal small bowel, likely due to post surgical atony, based on the CT appearance. Electronically Signed   By: Hulan Saas M.D.   On: 11/18/2015 10:09   Dg Abd Portable 1 View  Result Date: 11/17/2015 CLINICAL DATA:  55 year old female with enteric tube placement. EXAM: PORTABLE ABDOMEN - 1 VIEW COMPARISON:  Abdominal radiograph dated 11/17/2015 FINDINGS: There has been interval placement of the partially visualized enteric tube with tip in the left upper abdomen likely within the stomach. Air distended loop of bowel likely transverse colon measures up to 11 cm in diameter. Another air distended loop of bowel in the right hemipelvis noted which may represent the cecum. This findings are relatively similar to the CT dated 08/14/2015. The descending colon demonstrates a normal caliber. No free air identified. No radiopaque calculi. The osseous structures are grossly unremarkable. IMPRESSION: Interval placement of an enteric tube with tip in the left upper abdomen likely in the stomach. Air distended cecum and transverse  colon similar in appearance to the CT dated 08/14/2015. Electronically Signed   By: Elgie Collard M.D.   On: 11/17/2015 04:55        Scheduled Meds: . amLODipine  10 mg Oral Daily  . enoxaparin (LOVENOX) injection  40 mg Subcutaneous Q24H  . famotidine (PEPCID) IV  20 mg Intravenous Q12H  . gabapentin  300 mg Oral TID  . Influenza vac split quadrivalent PF  0.5 mL Intramuscular Tomorrow-1000  . lisinopril  20 mg Oral Daily  . mometasone-formoterol  2 puff Inhalation BID  . pantoprazole  40 mg Oral Daily  . rosuvastatin  10 mg Oral Daily  . sertraline  100 mg Oral Daily  . tiotropium  18 mcg Inhalation Daily  . triamterene-hydrochlorothiazide  1 tablet Oral Daily   Continuous Infusions: . sodium chloride 125 mL/hr at 11/18/15 1621     LOS: 1 day    Time spent:40 min    Deaisha Welborn, Roselind Messier, MD [facility]  Pager [phone]  If 7PM-7AM, please contact night-coverage [email or web address] Password [facility] 11/18/2015, 6:44 PM

## 2015-11-18 NOTE — Progress Notes (Signed)
Patient ID: [name], female   DOB: 12/16/1960, 55 y.o.   MRN: [id number]  [facility]     Subjective: Not passing gas today. Abdominal pain about the same. Feels less bloated. Nausea but no vomiting.  Objective: Vital signs in last 24 hours: Temp:  [97.5 F (36.4 C)-98.7 F (37.1 C)] 98.2 F (36.8 C) (09/26 0504) Pulse Rate:  [86-90] 86 (09/26 0504) Resp:  [1-20] 16 (09/26 0504) BP: (115-138)/(63-75) 138/71 (09/26 0504) SpO2:  [96 %-98 %] 96 % (09/26 0504) Last BM Date: 11/08/15  Intake/Output from previous day: 09/25 0701 - 09/26 0700 In: 2680 [I.V.:2650; NG/GT:30] Out: 300 [Emesis/NG output:300] Intake/Output this shift: No intake/output data recorded.  PE: Gen:  Alert, NAD, pleasant Abd: Soft, mildly distended, minimally tender diffusely, +BS  NG output 300cc/24hr -- also has 400cc in canister currently  Lab Results:   Recent Labs  11/17/15 0017 11/18/15 0443  WBC 7.0 4.5  HGB 13.0 11.5*  HCT 39.7 36.7  PLT 225 164   BMET  Recent Labs  11/17/15 0017 11/18/15 0443  NA 135 143  K 3.4* 3.6  CL 100* 111  CO2 23 22  GLUCOSE 103* 62*  BUN 6 5*  CREATININE 0.87 0.76  CALCIUM 9.9 8.9   PT/INR No results for input(s): LABPROT, INR in the last 72 hours. CMP     Component Value Date/Time   NA 143 11/18/2015 0443   K 3.6 11/18/2015 0443   CL 111 11/18/2015 0443   CO2 22 11/18/2015 0443   GLUCOSE 62 (L) 11/18/2015 0443   BUN 5 (L) 11/18/2015 0443   CREATININE 0.76 11/18/2015 0443   CALCIUM 8.9 11/18/2015 0443   PROT 5.2 (L) 11/18/2015 0443   ALBUMIN 3.1 (L) 11/18/2015 0443   AST 26 11/18/2015 0443   ALT 16 11/18/2015 0443   ALKPHOS 58 11/18/2015 0443   BILITOT 0.9 11/18/2015 0443   GFRNONAA >60 11/18/2015 0443   GFRAA >60 11/18/2015 0443   Lipase     Component Value Date/Time   LIPASE 17 08/14/2015 0208       Studies/Results: Dg Knee 2 Views Right  Result Date: 11/17/2015 CLINICAL DATA:  Acute onset  of pain and swelling at the right knee. Initial encounter. EXAM: RIGHT KNEE - 1-2 VIEW COMPARISON:  Right knee radiographs performed 06/02/2015 FINDINGS: There is no evidence of fracture or dislocation. 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 A small knee joint effusion is noted. No definite soft tissue abnormalities are otherwise seen. IMPRESSION: 1. No evidence of fracture or dislocation. Total knee arthroplasty is grossly unremarkable in appearance, without evidence of loosening. 2. Small knee joint effusion noted. Electronically Signed   By: Roanna RaiderJeffery  Chang M.D.   On: 11/17/2015 01:22   Ct Abdomen Pelvis W Contrast  Result Date: 11/17/2015 CLINICAL DATA:  55 year old female with abdominal pain. Evaluate for obstruction. EXAM: CT ABDOMEN AND PELVIS WITH CONTRAST TECHNIQUE: Multidetector CT imaging of the abdomen and pelvis was performed using the standard protocol following bolus administration of intravenous contrast. CONTRAST:  100mL ISOVUE-300 IOPAMIDOL (ISOVUE-300) INJECTION 61% COMPARISON:  Abdominal radiographs dated 11/17/2015 and CT dated 08/14/2015 FINDINGS: Lower chest: Minimal bibasilar haziness, likely atelectatic changes. The visualized lung bases are otherwise clear. No intra-abdominal free air or free fluid. Hepatobiliary: Probable mild fatty infiltration of the liver. No intrahepatic biliary ductal dilatation. There is a 1.8 cm stone or clusters of smaller stones in the gallbladder neck. No  pericholecystic fluid. Pancreas: Unremarkable. No pancreatic ductal dilatation or surrounding inflammatory changes. Spleen: Normal in size without focal abnormality. Adrenals/Urinary Tract: Adrenal glands are unremarkable. Kidneys are normal, without renal calculi, focal lesion, or hydronephrosis. Bladder is unremarkable. Stomach/Bowel: There is postsurgical changes of the anterior abdominal wall with a midline vertical anterior abdominal  wall incisional scar. There is abutment of loops of small bowel to the anterior peritoneal wall compatible with adhesions. An air distended loop of distal small bowel with air-fluid level noted in the mid and lower abdomen anteriorly as seen on the prior CTs. This may represent an abnormal chronically dilated segment of bowel versus a segment of bowel with functional dysmotility. A degree of underlying bowel obstruction is less likely but not excluded. There is no active bowel inflammation. Vascular/Lymphatic: There is mild aortoiliac atherosclerotic disease. The origins of the celiac axis, SMA, IMA as well as the origins of the renal arteries are patent. No portal venous gas identified. There is no adenopathy. Reproductive: Hysterectomy. Other: None Musculoskeletal: No acute or significant osseous findings. IMPRESSION: Chronically dilated air-filled distal small bowel loop in the mid and lower abdomen may represent chronic ileus or related to functional dysmotility. A degree of small-bowel obstruction is not entirely excluded. There is postsurgical changes of anterior abdominal wall with adhesion of this loop of bowel to the anterior peritoneum. Cholelithiasis. Electronically Signed   By: Elgie Collard M.D.   On: 11/17/2015 06:17   Dg Abdomen Acute W/chest  Result Date: 11/17/2015 CLINICAL DATA:  Acute onset of generalized abdominal pain and nausea. Initial encounter. EXAM: DG ABDOMEN ACUTE W/ 1V CHEST COMPARISON:  Chest and abdominal radiographs performed 08/14/2015 FINDINGS: The lungs are well-aerated and clear. There is no evidence of focal opacification, pleural effusion or pneumothorax. The cardiomediastinal silhouette is within normal limits. There is diffuse dilatation of the colon, measuring up to 13.8 cm in size. This may reflect a cecal volvulus. Remaining bowel loops are grossly unremarkable. No free intra-abdominal air is identified on the provided upright view. No acute osseous abnormalities are  seen; the sacroiliac joints are unremarkable in appearance. IMPRESSION: Diffuse dilatation of the colon, measuring up to 13.8 cm in size. This may reflect a cecal volvulus. Would correlate with the patient's symptoms, and evaluate further as deemed clinically appropriate. No free intra-abdominal air seen. Electronically Signed   By: Roanna Raider M.D.   On: 11/17/2015 01:20   Dg Abd Portable 1 View  Result Date: 11/17/2015 CLINICAL DATA:  55 year old female with enteric tube placement. EXAM: PORTABLE ABDOMEN - 1 VIEW COMPARISON:  Abdominal radiograph dated 11/17/2015 FINDINGS: There has been interval placement of the partially visualized enteric tube with tip in the left upper abdomen likely within the stomach. Air distended loop of bowel likely transverse colon measures up to 11 cm in diameter. Another air distended loop of bowel in the right hemipelvis noted which may represent the cecum. This findings are relatively similar to the CT dated 08/14/2015. The descending colon demonstrates a normal caliber. No free air identified. No radiopaque calculi. The osseous structures are grossly unremarkable. IMPRESSION: Interval placement of an enteric tube with tip in the left upper abdomen likely in the stomach. Air distended cecum and transverse colon similar in appearance to the CT dated 08/14/2015. Electronically Signed   By: Elgie Collard M.D.   On: 11/17/2015 04:55    Anti-infectives: Anti-infectives    None       Assessment/Plan Abdominal pain with nausea and vomiting SBO vs chronic ileus  or functional dysmotility - history of multiple abdominal surgeries, and recurrent SBO's. Most recent admission 08/14/15. - CT scan shows chronically dilated air-filled distal small bowel loop in the mid and lower abdomen may represent chronic ileus or related to functional dysmotility. A degree of small-bowel obstruction is not entirely excluded. - no flatus today - will start small bowel protocol - continue  NPO, NGT, antiemetics  GERD COPD HTN HLD Anxiety/Depression  ID - none VTE - lovenox, SCDs FEN - NPO, IVF  Plan - small bowel protocol with gastrograffin and xray. continue NPO and NGT.    LOS: 1 day    Edson Snowball , [facility] 11/18/2015, 8:31 AM Pager: [phone] Consults: [phone] Mon-Fri 7:00 am-4:30 pm Sat-Sun 7:00 am-11:30 am

## 2015-11-19 ENCOUNTER — Inpatient Hospital Stay (HOSPITAL_COMMUNITY): Payer: Medicare HMO

## 2015-11-19 DIAGNOSIS — K8050 Calculus of bile duct without cholangitis or cholecystitis without obstruction: Secondary | ICD-10-CM

## 2015-11-19 LAB — COMPREHENSIVE METABOLIC PANEL
ALT: 15 U/L (ref 14–54)
ANION GAP: 14 (ref 5–15)
AST: 22 U/L (ref 15–41)
Albumin: 3.1 g/dL — ABNORMAL LOW (ref 3.5–5.0)
Alkaline Phosphatase: 57 U/L (ref 38–126)
BUN: 5 mg/dL — ABNORMAL LOW (ref 6–20)
CHLORIDE: 107 mmol/L (ref 101–111)
CO2: 19 mmol/L — ABNORMAL LOW (ref 22–32)
Calcium: 8.9 mg/dL (ref 8.9–10.3)
Creatinine, Ser: 0.76 mg/dL (ref 0.44–1.00)
Glucose, Bld: 59 mg/dL — ABNORMAL LOW (ref 65–99)
POTASSIUM: 3.9 mmol/L (ref 3.5–5.1)
Sodium: 140 mmol/L (ref 135–145)
TOTAL PROTEIN: 6 g/dL — AB (ref 6.5–8.1)
Total Bilirubin: 1.3 mg/dL — ABNORMAL HIGH (ref 0.3–1.2)

## 2015-11-19 MED ORDER — BISACODYL 10 MG RE SUPP
10.0000 mg | Freq: Once | RECTAL | Status: AC
Start: 2015-11-19 — End: 2015-11-19
  Administered 2015-11-19: 10 mg via RECTAL
  Filled 2015-11-19: qty 1

## 2015-11-19 NOTE — Progress Notes (Signed)
PROGRESS NOTE    [name]  JYN:[id number] DOB: 1960/11/08 DOA: 11/17/2015 PCP: Pcp Not In System     Brief Narrative:   55 y.o. BF PMHx Depression, Asthma, Chronic Bronchitis, COPD, Gerd, HLD, HTN,, OA,  SBO related to postsurgical changes in 2003, after her hysterectomy, with recurrent admissions.Her last visit was on 08/14/2015.   Presents with acute onset of abdominal pain since last evening, epigastric, radiating to the lower quadrant, sharp, 9 out of 10.   CT of the abdomen and pelvis shows Chronically dilated air-filled distal small bowel loop in the mid and lower abdomen may represent chronic ileus or related to functional dysmotility. A degree of small-bowel obstruction is not entirely excluded. There is postsurgical changes of anterior abdominal wall with adhesion of this loop of bowel to the anterior peritoneum.  General surgery following   Subjective: Currently nothing by mouth, no BM yet Denies RUQ pain, has nausea    Assessment & Plan:   Principal Problem:   SBO (small bowel obstruction) (HCC) Active Problems:   Hypertension   GERD (gastroesophageal reflux disease)   Hyperlipidemia   Hypokalemia   Essential hypertension   Calculus of gallbladder with biliary obstruction but without cholecystitis   Small bowel obstruction (HCC)   Cholelithiasis 1.8 cm stone or clusters of smaller stones in the gallbladder neck -CT scan shows large stone in the gallbladder neck see results below Will obtain right upper quadrant ultrasound, possibly HIDA?    SBO vs chronic ileus or functional dysmotility - history of multiple abdominal surgeries, and recurrent SBO's. Most recent admission 08/14/15. - CT scan shows chronically dilated air-filled distal small bowel loop in the mid and lower abdomen may represent chronic ileus or related to functional dysmotility. A degree of small-bowel obstruction is not entirely excluded.  Small bowel protocol shows oral contrast throughout  the colon, - continue NPO, NGT, antiemetics General surgery following  Abdominal pain  -Resolved with placement of NG tube to suction  -Chronic Small Bowel Obstruction per CT abdomen, chronic in nature. .  -Bowel rest /NPO: Await surgery recommendations -Normal saline 75 ml/hr   GERD,  -no acute symptoms: -Continue PPI  COPD not on exacerbation -Albuterol nebulizer PRN -Dulera 200-5 micrograms BID -Spiriva 18 g daily  Hypertension BP 102/85   Pulse 101   -Amlodipine 10 mg daily -Hydralazine PRN -Lisinopril 20 mg daily   Anxiety/Depression -Ativan PRN -Zoloft 100 mg daily      DVT prophylaxis: Lovenox Code Status: Full Family Communication: None Disposition Plan: Per surgery   Consultants:  Dr.Faera Byerly CCS    Procedures/Significant Events:  9/25 CT abdomen pelvis W contrast:-Chronically dilated air-filled distal small bowel loop in the mid and lower abdomen may represent chronic ileus or related to functional dysmotility.  - There is postsurgical changes of anterior abdominal wall with adhesion of this loop of bowel to the anterior peritoneum. -Cholelithiasis.1.8 cm stone or clusters of smaller stones in the gallbladder neck.   Cultures Negative   Antimicrobials: None   Devices None   LINES / TUBES:      Continuous Infusions: . sodium chloride 75 mL/hr at 11/19/15 0500     Objective: Vitals:   11/18/15 0504 11/18/15 1348 11/18/15 2053 11/19/15 0551  BP: 138/71 117/78 132/66 131/61  Pulse: 86 79 79 89  Resp: 16 16 17 17   Temp: 98.2 F (36.8 C) 98.2 F (36.8 C) 97.9 F (36.6 C) 99.2 F (37.3 C)  TempSrc: Oral Oral Oral Oral  SpO2: 96%  100% 95% 99%  Weight:      Height:        Intake/Output Summary (Last 24 hours) at 11/19/15 0751 Last data filed at 11/19/15 1610  Gross per 24 hour  Intake             1925 ml  Output             3550 ml  Net            -1625 ml   Filed Weights   11/17/15 0008  Weight: 75.8 kg (167  lb)    Examination:  General: A/O 4, NAD, No acute respiratory distress Eyes: negative scleral hemorrhage, negative anisocoria, negative icterus ENT: Negative Runny nose, negative gingival bleeding, NG tube present to suction positive bilious fluid in canister Neck:  Negative scars, masses, torticollis, lymphadenopathy, JVD Lungs: Clear to auscultation bilaterally without wheezes or crackles Cardiovascular: Regular rate and rhythm without murmur gallop or rub normal S1 and S2 Abdomen: negative abdominal pain, nondistended, positive soft, bowel sounds, no rebound, no ascites, no appreciable mass Extremities: No significant cyanosis, clubbing, or edema bilateral lower extremities Skin: Negative rashes, lesions, ulcers Psychiatric:  Negative depression, negative anxiety, negative fatigue, negative mania  Central nervous system:  Cranial nerves II through XII intact, tongue/uvula midline, all extremities muscle strength 5/5, sensation intact throughout, negative dysarthria, negative expressive aphasia, negative receptive aphasia.  .     Data Reviewed: Care during the described time interval was provided by me .  I have reviewed this patient's available data, including medical history, events of note, physical examination, and all test results as part of my evaluation. I have personally reviewed and interpreted all radiology studies.  CBC:  Recent Labs Lab 11/17/15 0017 11/18/15 0443  WBC 7.0 4.5  NEUTROABS 3.5  --   HGB 13.0 11.5*  HCT 39.7 36.7  MCV 87.3 90.4  PLT 225 164   Basic Metabolic Panel:  Recent Labs Lab 11/17/15 0017 11/18/15 0443  NA 135 143  K 3.4* 3.6  CL 100* 111  CO2 23 22  GLUCOSE 103* 62*  BUN 6 5*  CREATININE 0.87 0.76  CALCIUM 9.9 8.9   GFR: Estimated Creatinine Clearance: 76.7 mL/min (by C-G formula based on SCr of 0.76 mg/dL). Liver Function Tests:  Recent Labs Lab 11/17/15 0017 11/18/15 0443  AST 37 26  ALT 19 16  ALKPHOS 71 58    BILITOT 0.8 0.9  PROT 7.4 5.2*  ALBUMIN 4.0 3.1*   No results for input(s): LIPASE, AMYLASE in the last 168 hours. No results for input(s): AMMONIA in the last 168 hours. Coagulation Profile: No results for input(s): INR, PROTIME in the last 168 hours. Cardiac Enzymes: No results for input(s): CKTOTAL, CKMB, CKMBINDEX, TROPONINI in the last 168 hours. BNP (last 3 results) No results for input(s): PROBNP in the last 8760 hours. HbA1C: No results for input(s): HGBA1C in the last 72 hours. CBG: No results for input(s): GLUCAP in the last 168 hours. Lipid Profile: No results for input(s): CHOL, HDL, LDLCALC, TRIG, CHOLHDL, LDLDIRECT in the last 72 hours. Thyroid Function Tests: No results for input(s): TSH, T4TOTAL, FREET4, T3FREE, THYROIDAB in the last 72 hours. Anemia Panel: No results for input(s): VITAMINB12, FOLATE, FERRITIN, TIBC, IRON, RETICCTPCT in the last 72 hours. Sepsis Labs: No results for input(s): PROCALCITON, LATICACIDVEN in the last 168 hours.  No results found for this or any previous visit (from the past 240 hour(s)).       Radiology Studies:  Dg Abd Portable 1v-small Bowel Obstruction Protocol-initial, 8 Hr Delay  Result Date: 11/19/2015 CLINICAL DATA:  55 year old female 8 hours status post Gastrografin administration. EXAM: PORTABLE ABDOMEN - 1 VIEW COMPARISON:  Abdominal radiograph dated 11/18/2015 and CT dated 11/17/2015 FINDINGS: Enteric tube partially visualized in similar position as previously likely in the stomach. Oral contrast is noted within the colon. Air distended loop of small bowel again noted in the mid abdomen and right lower pelvic area as seen on the prior CTs. This likely represents chronic process or related to functional dysmotility or postsurgical atony. No free air or radiopaque calculi. The soft tissues and osseous structures are grossly unremarkable. IMPRESSION: Oral contrast throughout the colon. Persistent and chronically air distended  loop of small bowel in the mid and lower abdomen. Electronically Signed   By: Elgie CollardArash  Radparvar M.D.   On: 11/19/2015 01:47   Dg Abd Portable 1v-small Bowel Protocol-position Verification  Result Date: 11/18/2015 CLINICAL DATA:  Bedside nasogastric tube placement. EXAM: PORTABLE ABDOMEN - 1 VIEW COMPARISON:  CT abdomen pelvis yesterday and earlier. Abdominal x-rays yesterday and earlier. FINDINGS: Nasogastric tube tip in the distal body of the stomach. The stomach is decompressed. Persistent marked gaseous distention of a loop of distal small bowel, likely post surgical atony based on the CT appearance. Bowel gas pattern otherwise unremarkable. IMPRESSION: 1. Nasogastric tube tip in the distal body of the stomach. 2. Chronic moderate gaseous distention of a loop of distal small bowel, likely due to post surgical atony, based on the CT appearance. Electronically Signed   By: Hulan Saashomas  Lawrence M.D.   On: 11/18/2015 10:09        Scheduled Meds: . amLODipine  10 mg Oral Daily  . bisacodyl  10 mg Rectal Once  . enoxaparin (LOVENOX) injection  40 mg Subcutaneous Q24H  . famotidine (PEPCID) IV  20 mg Intravenous Q12H  . gabapentin  300 mg Oral TID  . Influenza vac split quadrivalent PF  0.5 mL Intramuscular Tomorrow-1000  . lisinopril  20 mg Oral Daily  . mometasone-formoterol  2 puff Inhalation BID  . pantoprazole  40 mg Oral Daily  . rosuvastatin  10 mg Oral Daily  . sertraline  100 mg Oral Daily  . tiotropium  18 mcg Inhalation Daily  . triamterene-hydrochlorothiazide  1 tablet Oral Daily   Continuous Infusions: . sodium chloride 75 mL/hr at 11/19/15 0500     LOS: 2 days    Time spent:40 min    Richarda OverlieABROL,Amilcar Reever, MD [facility] Pager [phone]  If 7PM-7AM, please contact night-coverage [email or web address] Password [email or web address] 11/19/2015, 7:51 AM

## 2015-11-19 NOTE — Progress Notes (Signed)
Patient ID: Zoe AyeMary G Cowell, female   DOB: [birth year], 55 y.o.   MRN: [id number]  [facility]     Subjective: Feeling better today than yesterday. Passing gas, no BM. Denies n/v.  Objective: Vital signs in last 24 hours: Temp:  [97.9 F (36.6 C)-99.2 F (37.3 C)] 99.2 F (37.3 C) (09/27 0551) Pulse Rate:  [79-89] 89 (09/27 0551) Resp:  [16-17] 17 (09/27 0551) BP: (117-132)/(61-78) 131/61 (09/27 0551) SpO2:  [95 %-100 %] 99 % (09/27 0551) Last BM Date: 11/08/15  Intake/Output from previous day: 09/26 0701 - 09/27 0700 In: 1925 [I.V.:1825; IV Piggyback:100] Out: 3550 [Urine:2350; Emesis/NG output:1200] Intake/Output this shift: No intake/output data recorded.  PE: Gen:  Alert, NAD, pleasant Abd: Soft, minimally distended, mild TTP RUQ, +BS  NG output 1200cc/24hr  Lab Results:   Recent Labs  11/17/15 0017 11/18/15 0443  WBC 7.0 4.5  HGB 13.0 11.5*  HCT 39.7 36.7  PLT 225 164   BMET  Recent Labs  11/17/15 0017 11/18/15 0443  NA 135 143  K 3.4* 3.6  CL 100* 111  CO2 23 22  GLUCOSE 103* 62*  BUN 6 5*  CREATININE 0.87 0.76  CALCIUM 9.9 8.9   PT/INR No results for input(s): LABPROT, INR in the last 72 hours. CMP     Component Value Date/Time   NA 143 11/18/2015 0443   K 3.6 11/18/2015 0443   CL 111 11/18/2015 0443   CO2 22 11/18/2015 0443   GLUCOSE 62 (L) 11/18/2015 0443   BUN 5 (L) 11/18/2015 0443   CREATININE 0.76 11/18/2015 0443   CALCIUM 8.9 11/18/2015 0443   PROT 5.2 (L) 11/18/2015 0443   ALBUMIN 3.1 (L) 11/18/2015 0443   AST 26 11/18/2015 0443   ALT 16 11/18/2015 0443   ALKPHOS 58 11/18/2015 0443   BILITOT 0.9 11/18/2015 0443   GFRNONAA >60 11/18/2015 0443   GFRAA >60 11/18/2015 0443   Lipase     Component Value Date/Time   LIPASE 17 08/14/2015 0208       Studies/Results: Dg Abd Portable 1v  Result Date: 11/19/2015 CLINICAL DATA:  Small-bowel obstruction, nausea, followup EXAM: PORTABLE ABDOMEN - 1  VIEW COMPARISON:  KUB of 11/18/2015 FINDINGS: Some contrast is scattered throughout the nondistended colon. No small bowel distention is seen. NG tube tip lies in the region of the antrum of the stomach. IMPRESSION: No definite small-bowel obstruction. Contrast is noted throughout the nondistended colon. Electronically Signed   By: Dwyane DeePaul  Barry M.D.   On: 11/19/2015 08:01   Dg Abd Portable 1v-small Bowel Obstruction Protocol-initial, 8 Hr Delay  Result Date: 11/19/2015 CLINICAL DATA:  55 year old female 8 hours status post Gastrografin administration. EXAM: PORTABLE ABDOMEN - 1 VIEW COMPARISON:  Abdominal radiograph dated 11/18/2015 and CT dated 11/17/2015 FINDINGS: Enteric tube partially visualized in similar position as previously likely in the stomach. Oral contrast is noted within the colon. Air distended loop of small bowel again noted in the mid abdomen and right lower pelvic area as seen on the prior CTs. This likely represents chronic process or related to functional dysmotility or postsurgical atony. No free air or radiopaque calculi. The soft tissues and osseous structures are grossly unremarkable. IMPRESSION: Oral contrast throughout the colon. Persistent and chronically air distended loop of small bowel in the mid and lower abdomen. Electronically Signed   By: Elgie CollardArash  Radparvar M.D.   On: 11/19/2015 01:47   Dg Abd Portable 1v-small Bowel Protocol-position Verification  Result Date: 11/18/2015 CLINICAL DATA:  Bedside nasogastric tube placement. EXAM: PORTABLE ABDOMEN - 1 VIEW COMPARISON:  CT abdomen pelvis yesterday and earlier. Abdominal x-rays yesterday and earlier. FINDINGS: Nasogastric tube tip in the distal body of the stomach. The stomach is decompressed. Persistent marked gaseous distention of a loop of distal small bowel, likely post surgical atony based on the CT appearance. Bowel gas pattern otherwise unremarkable. IMPRESSION: 1. Nasogastric tube tip in the distal body of the stomach. 2.  Chronic moderate gaseous distention of a loop of distal small bowel, likely due to post surgical atony, based on the CT appearance. Electronically Signed   By: Hulan Saas M.D.   On: 11/18/2015 10:09    Anti-infectives: Anti-infectives    None       Assessment/Plan Abdominal pain with nausea and vomiting SBO vs chronic ileus or functional dysmotility - history of multiple abdominal surgeries, and recurrent SBO's. Most recent admission 08/14/15. - XR shows contrast in colon - +flatus, no BM. Suppository added - clamping trial. Ok to give PO meds during clamping trial.  GERD COPD HTN HLD Anxiety/Depression  ID - none VTE - lovenox, SCDs FEN - NPO, IVF  Plan - NG clamping trial (ok to give PO meds with few sips of water during trial). Suppository, encouraged ambulation. RUQ u/s pending to eval RUQ tenderness and cholelithiasis (LFT's normal yesterday, labs pending today).    LOS: 2 days    Edson Snowball , [facility] 11/19/2015, 8:05 AM Pager: [phone] Consults: [phone] Mon-Fri 7:00 am-4:30 pm Sat-Sun 7:00 am-11:30 am

## 2015-11-19 NOTE — Progress Notes (Signed)
1600 NGT removed, pt tolerated clear liquid diet, had a small BM today.

## 2015-11-20 ENCOUNTER — Inpatient Hospital Stay (HOSPITAL_COMMUNITY): Payer: Medicare HMO

## 2015-11-20 DIAGNOSIS — Z0189 Encounter for other specified special examinations: Secondary | ICD-10-CM

## 2015-11-20 LAB — COMPREHENSIVE METABOLIC PANEL
ALT: 16 U/L (ref 14–54)
AST: 23 U/L (ref 15–41)
Albumin: 3.5 g/dL (ref 3.5–5.0)
Alkaline Phosphatase: 61 U/L (ref 38–126)
Anion gap: 9 (ref 5–15)
CHLORIDE: 103 mmol/L (ref 101–111)
CO2: 24 mmol/L (ref 22–32)
Calcium: 9.3 mg/dL (ref 8.9–10.3)
Creatinine, Ser: 0.76 mg/dL (ref 0.44–1.00)
Glucose, Bld: 91 mg/dL (ref 65–99)
Potassium: 3.8 mmol/L (ref 3.5–5.1)
Sodium: 136 mmol/L (ref 135–145)
TOTAL PROTEIN: 6.7 g/dL (ref 6.5–8.1)
Total Bilirubin: 1 mg/dL (ref 0.3–1.2)

## 2015-11-20 LAB — CBC
HEMATOCRIT: 38.8 % (ref 36.0–46.0)
HEMOGLOBIN: 12.6 g/dL (ref 12.0–15.0)
MCH: 28.6 pg (ref 26.0–34.0)
MCHC: 32.5 g/dL (ref 30.0–36.0)
MCV: 88 fL (ref 78.0–100.0)
Platelets: 199 10*3/uL (ref 150–400)
RBC: 4.41 MIL/uL (ref 3.87–5.11)
RDW: 14.5 % (ref 11.5–15.5)
WBC: 5 10*3/uL (ref 4.0–10.5)

## 2015-11-20 MED ORDER — TECHNETIUM TC 99M MEBROFENIN IV KIT
5.0000 | PACK | Freq: Once | INTRAVENOUS | Status: AC | PRN
  Administered 2015-11-20: 5 via INTRAVENOUS

## 2015-11-20 MED ORDER — SENNA 8.6 MG PO TABS
1.0000 | ORAL_TABLET | Freq: Every day | ORAL | Status: DC
  Administered 2015-11-20 – 2015-11-22 (×2): 8.6 mg via ORAL
  Filled 2015-11-20 (×2): qty 1

## 2015-11-20 MED ORDER — DOCUSATE SODIUM 100 MG PO CAPS: 100.0000 mg | ORAL_CAPSULE | Freq: Two times a day (BID) | ORAL | Status: DC

## 2015-11-20 MED ORDER — POLYETHYLENE GLYCOL 3350 17 G PO PACK
17.0000 g | PACK | Freq: Every day | ORAL | Status: DC
  Administered 2015-11-20 – 2015-11-22 (×2): 17 g via ORAL
  Filled 2015-11-20 (×2): qty 1

## 2015-11-20 NOTE — Care Management Important Message (Signed)
Important Message  Patient Details  Name: [name] MRN: [id number] Date of Birth: [birth year]   Medicare Important Message Given:  Yes    Mailee Klaas Stefan ChurchBratton 11/20/2015, 3:38 PM

## 2015-11-20 NOTE — Progress Notes (Signed)
Patient ID: [name], female   DOB: 11/05/1960, 55 y.o.   MRN: [id number]   LOS: 3 days   Subjective: 1 bout of nausea yesterday at 1600, no emesis. Seems to be tolerating clears. Intermittent flatus, 1 runny BM yesterday. Abd pain unchanged.    Objective: Vital signs in last 24 hours: Temp:  [98.4 F (36.9 C)-100.4 F (38 C)] 100.4 F (38 C) (09/28 0518) Pulse Rate:  [89-91] 89 (09/28 0518) Resp:  [17-18] 18 (09/28 0518) BP: (111-123)/(52-68) 112/68 (09/28 0518) SpO2:  [95 %-100 %] 95 % (09/28 0518) Last BM Date: 11/19/15   Laboratory  CBC  Recent Labs  11/18/15 0443 11/20/15 0538  WBC 4.5 5.0  HGB 11.5* 12.6  HCT 36.7 38.8  PLT 164 199   BMET  Recent Labs  11/19/15 0834 11/20/15 0538  NA 140 136  K 3.9 3.8  CL 107 103  CO2 19* 24  GLUCOSE 59* 91  BUN 5* <5*  CREATININE 0.76 0.76  CALCIUM 8.9 9.3    Physical Exam General appearance: alert and no distress Resp: clear to auscultation bilaterally Cardio: regular rate and rhythm GI: Soft, +BS, mild TTP left   Assessment/Plan: Abdominal pain with nausea and vomiting SBO vs chronic ileus or functional dysmotility - history of multiple abdominal surgeries, and recurrent SBO's. Most recent admission 08/14/15. - XR shows contrast in colon - +flatus, small BM - Will give fulls  GERD COPD HTN HLD Anxiety/Depression  ID - none VTE - lovenox, SCDs FEN - NPO, IVF  Plan - Advance to fulls, bowel regimen    Freeman CaldronMichael J. Alyxandria Wentz, PA-C Pager: [phone] 11/20/2015

## 2015-11-20 NOTE — Progress Notes (Addendum)
PROGRESS NOTE    [name]  ZOX:[id number] DOB: Feb 07, 1961 DOA: 11/17/2015 PCP: Pcp Not In System     Brief Narrative:   55 y.o. BF PMHx Depression, Asthma, Chronic Bronchitis, COPD, Gerd, HLD, HTN,, OA,  SBO related to postsurgical changes in 2003, after her hysterectomy, with recurrent admissions.Her last visit was on 08/14/2015.   Presents with acute onset of abdominal pain since last evening, epigastric, radiating to the lower quadrant, sharp, 9 out of 10.   CT of the abdomen and pelvis shows Chronically dilated air-filled distal small bowel loop in the mid and lower abdomen may represent chronic ileus or related to functional dysmotility. A degree of small-bowel obstruction is not entirely excluded. There is postsurgical changes of anterior abdominal wall with adhesion of this loop of bowel to the anterior peritoneum.  General surgery following   Subjective: NGT removed, pt tolerated clear liquid diet, had a small BM     Assessment & Plan:   Principal Problem:   SBO (small bowel obstruction) (HCC) Active Problems:   Hypertension   GERD (gastroesophageal reflux disease)   Hyperlipidemia   Hypokalemia   Essential hypertension   Calculus of gallbladder with biliary obstruction but without cholecystitis   Small bowel obstruction (HCC)   Choledocholithiasis   Cholelithiasis 1.8 cm stone or clusters of smaller stones in the gallbladder neck -CT scan shows large stone in the gallbladder neck see results below Right upper quadrant ultrasound showed multiple gallstones, no evidence of acute cholecystitis, common bile duct diameter 5.1 mm,  HIDA shows chronic cholecystitis ?    SBO vs chronic ileus or functional dysmotility - history of multiple abdominal surgeries, and recurrent SBO's. Most recent admission 08/14/15. - CT scan shows chronically dilated air-filled distal small bowel loop in the mid and lower abdomen may represent chronic ileus or related to functional  dysmotility. A degree of small-bowel obstruction is not entirely excluded.  Small bowel protocol shows oral contrast throughout the colon, -Discontinued NGT, antiemetics, continue clear liquid diet General surgery following,SBO resolving.   Abdominal pain  -Resolved with placement of NG tube to suction  -Chronic Small Bowel Obstruction per CT abdomen, chronic in nature. Marland Kitchen.   advance diet per surgery -Normal saline 75 ml/hr   GERD,  -no acute symptoms: -Continue PPI  COPD not on exacerbation -Albuterol nebulizer PRN -Dulera 200-5 micrograms BID -Spiriva 18 g daily  Hypertension BP 102/85   Pulse 101   -Amlodipine 10 mg daily -Hydralazine PRN -Lisinopril 20 mg daily   Anxiety/Depression -Ativan PRN -Zoloft 100 mg daily      DVT prophylaxis: Lovenox Code Status: Full Family Communication: None Disposition Plan: Per surgery   Consultants:  Dr.Faera Byerly CCS    Procedures/Significant Events:  9/25 CT abdomen pelvis W contrast:-Chronically dilated air-filled distal small bowel loop in the mid and lower abdomen may represent chronic ileus or related to functional dysmotility.  - There is postsurgical changes of anterior abdominal wall with adhesion of this loop of bowel to the anterior peritoneum. -Cholelithiasis.1.8 cm stone or clusters of smaller stones in the gallbladder neck.   Cultures Negative   Antimicrobials: None   Devices None   LINES / TUBES:      Continuous Infusions: . sodium chloride 75 mL/hr at 11/19/15 1837     Objective: Vitals:   11/19/15 1453 11/19/15 2107 11/19/15 2125 11/20/15 0518  BP: (!) 123/52 111/61  112/68  Pulse: 91 90  89  Resp: 17 18  18   Temp: 98.6 F (  37 C) 98.4 F (36.9 C)  (!) 100.4 F (38 C)  TempSrc: Oral Oral  Oral  SpO2: 100% 98% 100% 95%  Weight:      Height:        Intake/Output Summary (Last 24 hours) at 11/20/15 0851 Last data filed at 11/20/15 0600  Gross per 24 hour  Intake              2020 ml  Output             2050 ml  Net              -30 ml   Filed Weights   11/17/15 0008  Weight: 75.8 kg (167 lb)    Examination:  General: A/O 4, NAD, No acute respiratory distress Eyes: negative scleral hemorrhage, negative anisocoria, negative icterus ENT: Negative Runny nose, negative gingival bleeding, NG tube present to suction positive bilious fluid in canister Neck:  Negative scars, masses, torticollis, lymphadenopathy, JVD Lungs: Clear to auscultation bilaterally without wheezes or crackles Cardiovascular: Regular rate and rhythm without murmur gallop or rub normal S1 and S2 Abdomen: negative abdominal pain, nondistended, positive soft, bowel sounds, no rebound, no ascites, no appreciable mass Extremities: No significant cyanosis, clubbing, or edema bilateral lower extremities Skin: Negative rashes, lesions, ulcers Psychiatric:  Negative depression, negative anxiety, negative fatigue, negative mania  Central nervous system:  Cranial nerves II through XII intact, tongue/uvula midline, all extremities muscle strength 5/5, sensation intact throughout, negative dysarthria, negative expressive aphasia, negative receptive aphasia.  .     Data Reviewed: Care during the described time interval was provided by me .  I have reviewed this patient's available data, including medical history, events of note, physical examination, and all test results as part of my evaluation. I have personally reviewed and interpreted all radiology studies.  CBC:  Recent Labs Lab 11/17/15 0017 11/18/15 0443 11/20/15 0538  WBC 7.0 4.5 5.0  NEUTROABS 3.5  --   --   HGB 13.0 11.5* 12.6  HCT 39.7 36.7 38.8  MCV 87.3 90.4 88.0  PLT 225 164 199   Basic Metabolic Panel:  Recent Labs Lab 11/17/15 0017 11/18/15 0443 11/19/15 0834 11/20/15 0538  NA 135 143 140 136  K 3.4* 3.6 3.9 3.8  CL 100* 111 107 103  CO2 23 22 19* 24  GLUCOSE 103* 62* 59* 91  BUN 6 5* 5* <5*  CREATININE 0.87  0.76 0.76 0.76  CALCIUM 9.9 8.9 8.9 9.3   GFR: Estimated Creatinine Clearance: 76.7 mL/min (by C-G formula based on SCr of 0.76 mg/dL). Liver Function Tests:  Recent Labs Lab 11/17/15 0017 11/18/15 0443 11/19/15 0834 11/20/15 0538  AST 37 26 22 23   ALT 19 16 15 16   ALKPHOS 71 58 57 61  BILITOT 0.8 0.9 1.3* 1.0  PROT 7.4 5.2* 6.0* 6.7  ALBUMIN 4.0 3.1* 3.1* 3.5   No results for input(s): LIPASE, AMYLASE in the last 168 hours. No results for input(s): AMMONIA in the last 168 hours. Coagulation Profile: No results for input(s): INR, PROTIME in the last 168 hours. Cardiac Enzymes: No results for input(s): CKTOTAL, CKMB, CKMBINDEX, TROPONINI in the last 168 hours. BNP (last 3 results) No results for input(s): PROBNP in the last 8760 hours. HbA1C: No results for input(s): HGBA1C in the last 72 hours. CBG: No results for input(s): GLUCAP in the last 168 hours. Lipid Profile: No results for input(s): CHOL, HDL, LDLCALC, TRIG, CHOLHDL, LDLDIRECT in the last 72 hours. Thyroid Function  Tests: No results for input(s): TSH, T4TOTAL, FREET4, T3FREE, THYROIDAB in the last 72 hours. Anemia Panel: No results for input(s): VITAMINB12, FOLATE, FERRITIN, TIBC, IRON, RETICCTPCT in the last 72 hours. Sepsis Labs: No results for input(s): PROCALCITON, LATICACIDVEN in the last 168 hours.  No results found for this or any previous visit (from the past 240 hour(s)).       Radiology Studies: Dg Abd Portable 1v  Result Date: 11/19/2015 CLINICAL DATA:  Small-bowel obstruction, nausea, followup EXAM: PORTABLE ABDOMEN - 1 VIEW COMPARISON:  KUB of 11/18/2015 FINDINGS: Some contrast is scattered throughout the nondistended colon. No small bowel distention is seen. NG tube tip lies in the region of the antrum of the stomach. IMPRESSION: No definite small-bowel obstruction. Contrast is noted throughout the nondistended colon. Electronically Signed   By: Dwyane Dee M.D.   On: 11/19/2015 08:01   Dg  Abd Portable 1v-small Bowel Obstruction Protocol-initial, 8 Hr Delay  Result Date: 11/19/2015 CLINICAL DATA:  55 year old female 8 hours status post Gastrografin administration. EXAM: PORTABLE ABDOMEN - 1 VIEW COMPARISON:  Abdominal radiograph dated 11/18/2015 and CT dated 11/17/2015 FINDINGS: Enteric tube partially visualized in similar position as previously likely in the stomach. Oral contrast is noted within the colon. Air distended loop of small bowel again noted in the mid abdomen and right lower pelvic area as seen on the prior CTs. This likely represents chronic process or related to functional dysmotility or postsurgical atony. No free air or radiopaque calculi. The soft tissues and osseous structures are grossly unremarkable. IMPRESSION: Oral contrast throughout the colon. Persistent and chronically air distended loop of small bowel in the mid and lower abdomen. Electronically Signed   By: Elgie Collard M.D.   On: 11/19/2015 01:47   Dg Abd Portable 1v-small Bowel Protocol-position Verification  Result Date: 11/18/2015 CLINICAL DATA:  Bedside nasogastric tube placement. EXAM: PORTABLE ABDOMEN - 1 VIEW COMPARISON:  CT abdomen pelvis yesterday and earlier. Abdominal x-rays yesterday and earlier. FINDINGS: Nasogastric tube tip in the distal body of the stomach. The stomach is decompressed. Persistent marked gaseous distention of a loop of distal small bowel, likely post surgical atony based on the CT appearance. Bowel gas pattern otherwise unremarkable. IMPRESSION: 1. Nasogastric tube tip in the distal body of the stomach. 2. Chronic moderate gaseous distention of a loop of distal small bowel, likely due to post surgical atony, based on the CT appearance. Electronically Signed   By: Hulan Saas M.D.   On: 11/18/2015 10:09   US Abdomen Limited Ruq  Result Date: 11/19/2015 CLINICAL DATA:  Abdominal pain for 4 days.  Known gallstones. EXAM: US ABDOMEN LIMITED - RIGHT UPPER QUADRANT COMPARISON:   CT scan 11/17/2015 FINDINGS: Gallbladder: Dependent cluster of gallstones noted down near the gallbladder neck. Multiple shadowing gallstones. The largest measures 7 mm. No gallbladder wall thickening, pericholecystic fluid or sonographic Murphy sign. Common bile duct: Diameter: 5.1 mm Liver: Normal echogenicity without focal lesion or biliary dilatation. IMPRESSION: Cholelithiasis without sonographic findings of acute cholecystitis. Electronically Signed   By: Rudie Meyer M.D.   On: 11/19/2015 09:51        Scheduled Meds: . amLODipine  10 mg Oral Daily  . enoxaparin (LOVENOX) injection  40 mg Subcutaneous Q24H  . famotidine (PEPCID) IV  20 mg Intravenous Q12H  . gabapentin  300 mg Oral TID  . Influenza vac split quadrivalent PF  0.5 mL Intramuscular Tomorrow-1000  . lisinopril  20 mg Oral Daily  . mometasone-formoterol  2 puff Inhalation  BID  . pantoprazole  40 mg Oral Daily  . rosuvastatin  10 mg Oral Daily  . sertraline  100 mg Oral Daily  . tiotropium  18 mcg Inhalation Daily  . triamterene-hydrochlorothiazide  1 tablet Oral Daily   Continuous Infusions: . sodium chloride 75 mL/hr at 11/19/15 1837     LOS: 3 days    Time spent:40 min    [name], MD [facility] Pager [phone]  If 7PM-7AM, please contact night-coverage [email or web address] Password [facility] 11/20/2015, 8:51 AM

## 2015-11-21 MED ORDER — FAMOTIDINE 20 MG PO TABS
20.0000 mg | ORAL_TABLET | Freq: Two times a day (BID) | ORAL | Status: DC
  Administered 2015-11-21 – 2015-11-22 (×3): 20 mg via ORAL
  Filled 2015-11-21 (×3): qty 1

## 2015-11-21 NOTE — Progress Notes (Signed)
PROGRESS NOTE    Zoe AyeMary G Strickland  ZOX:[id number] DOB: 09-Nov-1960 DOA: 11/17/2015 PCP: Pcp Not In System     Brief Narrative:   55 y.o. BF PMHx Depression, Asthma, Chronic Bronchitis, COPD, Gerd, HLD, HTN,, OA,  SBO related to postsurgical changes in 2003, after her hysterectomy, with recurrent admissions.Her last visit was on 08/14/2015.   Presents with acute onset of abdominal pain since last evening, epigastric, radiating to the lower quadrant, sharp, 9 out of 10.   CT of the abdomen and pelvis shows Chronically dilated air-filled distal small bowel loop in the mid and lower abdomen may represent chronic ileus or related to functional dysmotility. A degree of small-bowel obstruction is not entirely excluded. There is postsurgical changes of anterior abdominal wall with adhesion of this loop of bowel to the anterior peritoneum.  General surgery following   Subjective:   Multiple liquid bowel movements overnight  Assessment & Plan:   Principal Problem:   SBO (small bowel obstruction) (HCC) Active Problems:   Hypertension   GERD (gastroesophageal reflux disease)   Hyperlipidemia   Hypokalemia   Essential hypertension   Calculus of gallbladder with biliary obstruction but without cholecystitis   Small bowel obstruction (HCC)   Choledocholithiasis   Encounter for imaging study to confirm nasogastric (NG) tube placement   Cholelithiasis 1.8 cm stone or clusters of smaller stones in the gallbladder neck -CT scan shows large stone in the gallbladder neck see results below Right upper quadrant ultrasound showed multiple gallstones, no evidence of acute cholecystitis, common bile duct diameter 5.1 mm,  HIDA shows chronic cholecystitis ?chronic biliary dyskinesia  No indication for surgery at this time as per surgery   SBO vs chronic ileus or functional dysmotility - history of multiple abdominal surgeries, and recurrent SBO's. Most recent admission 08/14/15. - CT scan shows  chronically dilated air-filled distal small bowel loop in the mid and lower abdomen may represent chronic ileus or related to functional dysmotility. A degree of small-bowel obstruction is not entirely excluded.  Small bowel protocol shows oral contrast throughout the colon, -Discontinued NGT, antiemetics, continue to advance to regular diet General surgery following,SBO resolving., Anticipate discharge home tomorrow   Abdominal pain  -Resolved with placement of NG tube to suction  -Chronic Small Bowel Obstruction per CT abdomen, chronic in nature. Marland Kitchen.   advance diet per surgery -Normal saline 75 ml/hr   GERD,  -no acute symptoms: -Continue PPI  COPD not on exacerbation -Albuterol nebulizer PRN -Dulera 200-5 micrograms BID -Spiriva 18 g daily  Hypertension BP 102/85   Pulse 101   -Amlodipine 10 mg daily -Hydralazine PRN -Lisinopril 20 mg daily   Anxiety/Depression -Ativan PRN -Zoloft 100 mg daily      DVT prophylaxis: Lovenox Code Status: Full Family Communication: None Disposition Plan: Anticipate discharge home tomorrow   Consultants:  Dr.Faera Byerly CCS    Procedures/Significant Events:  9/25 CT abdomen pelvis W contrast:-Chronically dilated air-filled distal small bowel loop in the mid and lower abdomen may represent chronic ileus or related to functional dysmotility.  - There is postsurgical changes of anterior abdominal wall with adhesion of this loop of bowel to the anterior peritoneum. -Cholelithiasis.1.8 cm stone or clusters of smaller stones in the gallbladder neck.   Cultures Negative   Antimicrobials: None   Devices None   LINES / TUBES:      Continuous Infusions: . sodium chloride 75 mL/hr at 11/21/15 0524     Objective: Vitals:   11/20/15 1412 11/20/15 2029 11/20/15 2128  11/21/15 0522  BP: 99/60  (!) 129/43 (!) 98/52  Pulse: 85  75 76  Resp: 16  16 16   Temp: 97.3 F (36.3 C)  98.2 F (36.8 C) 98.6 F (37 C)  TempSrc:  Oral  Oral Oral  SpO2: 99% 97% 100% 100%  Weight:      Height:        Intake/Output Summary (Last 24 hours) at 11/21/15 1055 Last data filed at 11/21/15 1042  Gross per 24 hour  Intake           3297.5 ml  Output             2350 ml  Net            947.5 ml   Filed Weights   11/17/15 0008  Weight: 75.8 kg (167 lb)    Examination:  General: A/O 4, NAD, No acute respiratory distress Eyes: negative scleral hemorrhage, negative anisocoria, negative icterus ENT: Negative Runny nose, negative gingival bleeding, NG tube present to suction positive bilious fluid in canister Neck:  Negative scars, masses, torticollis, lymphadenopathy, JVD Lungs: Clear to auscultation bilaterally without wheezes or crackles Cardiovascular: Regular rate and rhythm without murmur gallop or rub normal S1 and S2 Abdomen: negative abdominal pain, nondistended, positive soft, bowel sounds, no rebound, no ascites, no appreciable mass Extremities: No significant cyanosis, clubbing, or edema bilateral lower extremities Skin: Negative rashes, lesions, ulcers Psychiatric:  Negative depression, negative anxiety, negative fatigue, negative mania  Central nervous system:  Cranial nerves II through XII intact, tongue/uvula midline, all extremities muscle strength 5/5, sensation intact throughout, negative dysarthria, negative expressive aphasia, negative receptive aphasia.  .     Data Reviewed: Care during the described time interval was provided by me .  I have reviewed this patient's available data, including medical history, events of note, physical examination, and all test results as part of my evaluation. I have personally reviewed and interpreted all radiology studies.  CBC:  Recent Labs Lab 11/17/15 0017 11/18/15 0443 11/20/15 0538  WBC 7.0 4.5 5.0  NEUTROABS 3.5  --   --   HGB 13.0 11.5* 12.6  HCT 39.7 36.7 38.8  MCV 87.3 90.4 88.0  PLT 225 164 199   Basic Metabolic Panel:  Recent Labs Lab  11/17/15 0017 11/18/15 0443 11/19/15 0834 11/20/15 0538  NA 135 143 140 136  K 3.4* 3.6 3.9 3.8  CL 100* 111 107 103  CO2 23 22 19* 24  GLUCOSE 103* 62* 59* 91  BUN 6 5* 5* <5*  CREATININE 0.87 0.76 0.76 0.76  CALCIUM 9.9 8.9 8.9 9.3   GFR: Estimated Creatinine Clearance: 76.7 mL/min (by C-G formula based on SCr of 0.76 mg/dL). Liver Function Tests:  Recent Labs Lab 11/17/15 0017 11/18/15 0443 11/19/15 0834 11/20/15 0538  AST 37 26 22 23   ALT 19 16 15 16   ALKPHOS 71 58 57 61  BILITOT 0.8 0.9 1.3* 1.0  PROT 7.4 5.2* 6.0* 6.7  ALBUMIN 4.0 3.1* 3.1* 3.5   No results for input(s): LIPASE, AMYLASE in the last 168 hours. No results for input(s): AMMONIA in the last 168 hours. Coagulation Profile: No results for input(s): INR, PROTIME in the last 168 hours. Cardiac Enzymes: No results for input(s): CKTOTAL, CKMB, CKMBINDEX, TROPONINI in the last 168 hours. BNP (last 3 results) No results for input(s): PROBNP in the last 8760 hours. HbA1C: No results for input(s): HGBA1C in the last 72 hours. CBG: No results for input(s): GLUCAP in the last  168 hours. Lipid Profile: No results for input(s): CHOL, HDL, LDLCALC, TRIG, CHOLHDL, LDLDIRECT in the last 72 hours. Thyroid Function Tests: No results for input(s): TSH, T4TOTAL, FREET4, T3FREE, THYROIDAB in the last 72 hours. Anemia Panel: No results for input(s): VITAMINB12, FOLATE, FERRITIN, TIBC, IRON, RETICCTPCT in the last 72 hours. Sepsis Labs: No results for input(s): PROCALCITON, LATICACIDVEN in the last 168 hours.  No results found for this or any previous visit (from the past 240 hour(s)).       Radiology Studies: Nm Hepato W/eject Fract  Result Date: 11/20/2015 CLINICAL DATA:  55 year old female inpatient with intractable nausea and vomiting. Cholelithiasis. EXAM: NUCLEAR MEDICINE HEPATOBILIARY IMAGING WITH GALLBLADDER EF TECHNIQUE: Sequential images of the abdomen were obtained out to 60 minutes following  intravenous administration of radiopharmaceutical. After oral ingestion of Ensure, gallbladder ejection fraction was determined. At 60 min, normal ejection fraction is greater than 33%. RADIOPHARMACEUTICALS:  5.2 mCi Tc-35m  Choletec IV COMPARISON:  11/19/2015 abdominal sonogram and 11/17/2015 CT abdomen/pelvis. FINDINGS: Prompt uptake and biliary excretion of activity by the liver is seen. Gallbladder activity is visualized, consistent with patency of cystic duct. Biliary activity passes into small bowel, consistent with patent common bile duct. Calculated gallbladder ejection fraction is 19%. (Normal gallbladder ejection fraction with Ensure is greater than 33%.) IMPRESSION: 1. Gallbladder activity visualized, consistent with cystic duct patency. Findings are not consistent with acute cholecystitis. 2. Abnormally low gallbladder ejection fraction, consistent with hypokinesia, which can be seen in the setting of chronic cholecystitis. 3. Biliary activity transit into the small bowel, consistent with patent common bile duct. Electronically Signed   By: Delbert Phenix M.D.   On: 11/20/2015 10:32        Scheduled Meds: . amLODipine  10 mg Oral Daily  . enoxaparin (LOVENOX) injection  40 mg Subcutaneous Q24H  . famotidine  20 mg Oral BID  . gabapentin  300 mg Oral TID  . Influenza vac split quadrivalent PF  0.5 mL Intramuscular Tomorrow-1000  . lisinopril  20 mg Oral Daily  . mometasone-formoterol  2 puff Inhalation BID  . pantoprazole  40 mg Oral Daily  . polyethylene glycol  17 g Oral Daily  . rosuvastatin  10 mg Oral Daily  . senna  1 tablet Oral Daily  . sertraline  100 mg Oral Daily  . tiotropium  18 mcg Inhalation Daily  . triamterene-hydrochlorothiazide  1 tablet Oral Daily   Continuous Infusions: . sodium chloride 75 mL/hr at 11/21/15 0524     LOS: 4 days    Time spent:40 min    [name], MD [facility] Pager [phone]  If 7PM-7AM, please contact  night-coverage [email or web address] Password [facility] 11/21/2015, 10:55 AM

## 2015-11-21 NOTE — Progress Notes (Signed)
With regard to recent NM biliary study, while results are indicative of chronic biliary dyskinesia, this is not an indication for immediate surgical intervention. This should not prevent patient from discharge from hospital in next 1-2 days. Patient's diet should be advanced to either full liquids of regular diet today. Discharge planning per IM service.

## 2015-11-21 NOTE — Progress Notes (Signed)
Patient has complained of lower abdominal/pelivic pain throughout night. Will continue to monitor.  Cindee SaltMcBride,Dominque Marlin K, RN

## 2015-11-21 NOTE — Progress Notes (Signed)
[facility] Progress Note  Subjective: Feeling well and states she is very eager to advance her diet and "eat regular food." Multiple liquid bowel movements overnight. No nausea or vomiting. Tolerating clears. Ambulatory without difficulty and passing urine without issue. Reports few areas of mild abdominal tenderness, but these are described as migratory and intermittent.   Objective: Vital signs in last 24 hours: Temp:  [97.3 F (36.3 C)-98.6 F (37 C)] 98.6 F (37 C) (09/29 0522) Pulse Rate:  [75-85] 76 (09/29 0522) Resp:  [16] 16 (09/29 0522) BP: (98-129)/(43-60) 98/52 (09/29 0522) SpO2:  [97 %-100 %] 100 % (09/29 0522) Last BM Date: 11/21/15  Intake/Output from previous day: 09/28 0701 - 09/29 0700 In: 2697.5 [P.O.:840; I.V.:1757.5; IV Piggyback:100] Out: 1550 [Urine:1550] Intake/Output this shift: No intake/output data recorded.  PE: General appearance: seated in bed. Talkative, animated and appears comfortable. Resp: clear to auscultation bilaterally Cardio: regular rate and rhythm GI: Soft, hypoactive BS, Mild distention. Scattered areas of mild tenderness.   Lab Results:   Recent Labs  11/20/15 0538  WBC 5.0  HGB 12.6  HCT 38.8  PLT 199   BMET  Recent Labs  11/19/15 0834 11/20/15 0538  NA 140 136  K 3.9 3.8  CL 107 103  CO2 19* 24  GLUCOSE 59* 91  BUN 5* <5*  CREATININE 0.76 0.76  CALCIUM 8.9 9.3   PT/INR No results for input(s): LABPROT, INR in the last 72 hours. CMP     Component Value Date/Time   NA 136 11/20/2015 0538   K 3.8 11/20/2015 0538   CL 103 11/20/2015 0538   CO2 24 11/20/2015 0538   GLUCOSE 91 11/20/2015 0538   BUN <5 (L) 11/20/2015 0538   CREATININE 0.76 11/20/2015 0538   CALCIUM 9.3 11/20/2015 0538   PROT 6.7 11/20/2015 0538   ALBUMIN 3.5 11/20/2015 0538   AST 23 11/20/2015 0538   ALT 16 11/20/2015 0538   ALKPHOS 61 11/20/2015 0538   BILITOT 1.0 11/20/2015 0538   GFRNONAA >60 11/20/2015 0538   GFRAA >60  11/20/2015 0538   Lipase     Component Value Date/Time   LIPASE 17 08/14/2015 0208       Studies/Results: Nm Hepato W/eject Fract  Result Date: 11/20/2015 CLINICAL DATA:  55 year old female inpatient with intractable nausea and vomiting. Cholelithiasis. EXAM: NUCLEAR MEDICINE HEPATOBILIARY IMAGING WITH GALLBLADDER EF TECHNIQUE: Sequential images of the abdomen were obtained out to 60 minutes following intravenous administration of radiopharmaceutical. After oral ingestion of Ensure, gallbladder ejection fraction was determined. At 60 min, normal ejection fraction is greater than 33%. RADIOPHARMACEUTICALS:  5.2 mCi Tc-85m  Choletec IV COMPARISON:  11/19/2015 abdominal sonogram and 11/17/2015 CT abdomen/pelvis. FINDINGS: Prompt uptake and biliary excretion of activity by the liver is seen. Gallbladder activity is visualized, consistent with patency of cystic duct. Biliary activity passes into small bowel, consistent with patent common bile duct. Calculated gallbladder ejection fraction is 19%. (Normal gallbladder ejection fraction with Ensure is greater than 33%.) IMPRESSION: 1. Gallbladder activity visualized, consistent with cystic duct patency. Findings are not consistent with acute cholecystitis. 2. Abnormally low gallbladder ejection fraction, consistent with hypokinesia, which can be seen in the setting of chronic cholecystitis. 3. Biliary activity transit into the small bowel, consistent with patent common bile duct. Electronically Signed   By: Delbert Phenix M.D.   On: 11/20/2015 10:32   US Abdomen Limited Ruq  Result Date: 11/19/2015 CLINICAL DATA:  Abdominal pain for 4 days.  Known gallstones. EXAM: US  ABDOMEN LIMITED - RIGHT UPPER QUADRANT COMPARISON:  CT scan 11/17/2015 FINDINGS: Gallbladder: Dependent cluster of gallstones noted down near the gallbladder neck. Multiple shadowing gallstones. The largest measures 7 mm. No gallbladder wall thickening, pericholecystic fluid or sonographic  Murphy sign. Common bile duct: Diameter: 5.1 mm Liver: Normal echogenicity without focal lesion or biliary dilatation. IMPRESSION: Cholelithiasis without sonographic findings of acute cholecystitis. Electronically Signed   By: Rudie MeyerP.  Gallerani M.D.   On: 11/19/2015 09:51    Anti-infectives: Anti-infectives    None       Assessment/Plan: Abdominal pain with nausea and vomiting SBO vs chronic ileus or functional dysmotility - history of multiple abdominal surgeries, and recurrent SBO's. Most recent admission 08/14/15. - XR shows contrast in colon - good clinical evidence of return of bowel function with multiple BMs overnight and resolution of nausea and vomiting on a clear liquid diet. Currently without evidence of need for surgical intervention. Will continue to make rounds on this patient on PRN basis.   GERD COPD HTN HLD Anxiety/Depression  ID - none VTE - lovenox, SCDs, ambulation FEN - on clears  Plan - Advance to fulls, bowel regimen. Anticipate discharge home in the next 1-2 days per IM service.      LOS: 4 days    Narda BondsLEE Reka Wist, [facility] 11/21/2015, 9:32 AM Pager: [phone]

## 2015-11-22 MED ORDER — SENNA 8.6 MG PO TABS: 1.0000 | ORAL_TABLET | Freq: Every day | ORAL | 0 refills | Status: DC

## 2015-11-22 NOTE — Progress Notes (Signed)
Patient discharged to home with instructions. 

## 2015-11-22 NOTE — Discharge Summary (Signed)
Physician Discharge Summary  [name] MRN: [id number] DOB/AGE: 10-06-1960 55 y.o.  PCP: Pcp Not In System   Admit date: 11/17/2015 Discharge date: 11/22/2015  Discharge Diagnoses:   Principal Problem:   SBO (small bowel obstruction) (HCC) Active Problems:   Hypertension   GERD (gastroesophageal reflux disease)   Hyperlipidemia   Hypokalemia   Essential hypertension   Calculus of gallbladder with biliary obstruction but without cholecystitis   Small bowel obstruction (HCC)   Choledocholithiasis   Encounter for imaging study to confirm nasogastric (NG) tube placement    Follow-up recommendations Follow-up with PCP in 3-5 days , including all  additional recommended appointments as below Follow-up CBC, CMP in 3-5 days Patient to follow-up with general surgery      Current Discharge Medication List    START taking these medications   Details  senna (SENOKOT) 8.6 MG TABS tablet Take 1 tablet (8.6 mg total) by mouth daily. Qty: 120 each, Refills: 0      CONTINUE these medications which have NOT CHANGED   Details  albuterol (PROVENTIL HFA;VENTOLIN HFA) 108 (90 BASE) MCG/ACT inhaler Inhale 1 puff into the lungs every 6 (six) hours as needed for wheezing or shortness of breath (wheezing).     amLODipine (NORVASC) 10 MG tablet Take 10 mg by mouth daily.    diclofenac sodium (VOLTAREN) 1 % GEL Apply 2 g topically 4 (four) times daily as needed (knee pain).     diphenhydrAMINE (BENADRYL) 25 MG tablet Take 25 mg by mouth every 6 (six) hours as needed for allergies (allergies).    esomeprazole (NEXIUM) 40 MG capsule Take 40 mg by mouth daily at 12 noon.    Fluticasone-Salmeterol (ADVAIR) 500-50 MCG/DOSE AEPB Inhale 1 puff into the lungs 2 (two) times daily.    gabapentin (NEURONTIN) 300 MG capsule Take 300 mg by mouth 3 (three) times daily.    lisinopril (PRINIVIL,ZESTRIL) 20 MG tablet Take 20 mg by mouth daily.    loratadine (CLARITIN) 10 MG tablet Take 10 mg by  mouth daily.    LORazepam (ATIVAN) 0.5 MG tablet Take 0.5 mg by mouth every 6 (six) hours as needed for anxiety (anxiety).     meloxicam (MOBIC) 15 MG tablet Take 15 mg by mouth daily as needed for pain.  Refills: 3    mometasone (NASONEX) 50 MCG/ACT nasal spray USE 1 PUFF IN THE NOSTRILS TWICE A DAY AS NEEDED FOR CONGESTION Refills: 0    Multiple Vitamin (MULTIVITAMIN WITH MINERALS) TABS tablet Take 1 tablet by mouth daily.    polyethylene glycol (MIRALAX / GLYCOLAX) packet Take 17 g by mouth daily as needed for mild constipation or moderate constipation (constipation).     promethazine (PHENERGAN) 25 MG tablet Take 25 mg by mouth every 8 (eight) hours as needed for nausea or vomiting (nausea).     rosuvastatin (CRESTOR) 10 MG tablet Take 10 mg by mouth daily.    sertraline (ZOLOFT) 100 MG tablet Take 100 mg by mouth daily.    tiotropium (SPIRIVA) 18 MCG inhalation capsule Place 18 mcg into inhaler and inhale daily.    traMADol (ULTRAM) 50 MG tablet Take 1 tablet (50 mg total) by mouth every 6 (six) hours as needed. Qty: 30 tablet, Refills: 0    traZODone (DESYREL) 100 MG tablet Take 100 mg by mouth at bedtime as needed for sleep.  Refills: 1    triamterene-hydrochlorothiazide (MAXZIDE-25) 37.5-25 MG tablet Take 1 tablet by mouth daily. Refills: 3    bisacodyl (DULCOLAX) 10  MG suppository Place 1 suppository (10 mg total) rectally daily as needed for moderate constipation. Qty: 12 suppository, Refills: 0    ipratropium (ATROVENT) 0.06 % nasal spray Place 2 sprays into the nose 4 (four) times daily. Qty: 15 mL, Refills: 12    methocarbamol (ROBAXIN) 500 MG tablet Take 1 tablet (500 mg total) by mouth 2 (two) times daily as needed (back pain). Qty: 20 tablet, Refills: 0         Discharge Condition: *Stable    Discharge Instructions Get Medicines reviewed and adjusted: Please take all your medications with you for your next visit with your Primary MD  Please request  your Primary MD to go over all hospital tests and procedure/radiological results at the follow up, please ask your Primary MD to get all Hospital records sent to his/her office.  If you experience worsening of your admission symptoms, develop shortness of breath, life threatening emergency, suicidal or homicidal thoughts you must seek medical attention immediately by calling 911 or calling your MD immediately if symptoms less severe.  You must read complete instructions/literature along with all the possible adverse reactions/side effects for all the Medicines you take and that have been prescribed to you. Take any new Medicines after you have completely understood and accpet all the possible adverse reactions/side effects.   Do not drive when taking Pain medications.   Do not take more than prescribed Pain, Sleep and Anxiety Medications  Special Instructions: If you have smoked or chewed Tobacco in the last 2 yrs please stop smoking, stop any regular Alcohol and or any Recreational drug use.  Wear Seat belts while driving.  Please note  You were cared for by a hospitalist during your hospital stay. Once you are discharged, your primary care physician will handle any further medical issues. Please note that NO REFILLS for any discharge medications will be authorized once you are discharged, as it is imperative that you return to your primary care physician (or establish a relationship with a primary care physician if you do not have one) for your aftercare needs so that they can reassess your need for medications and monitor your lab values.     Allergies  Allergen Reactions  . Bee Venom Anaphylaxis and Itching  . Peanuts [Peanut Oil] Anaphylaxis and Itching  . Shellfish Allergy Anaphylaxis and Itching  . Aspirin Itching  . Corn-Containing Products Itching  . Ibuprofen Itching  . Latex Itching  . Morphine And Related Itching  . Zofran [Ondansetron Hcl] Itching and Rash       Disposition: 01-Home or Self Care   Consults:  Gen. surgery     Significant Diagnostic Studies:  Dg Knee 2 Views Right  Result Date: 11/17/2015 CLINICAL DATA:  Acute onset of pain and swelling at the right knee. Initial encounter. EXAM: RIGHT KNEE - 1-2 VIEW COMPARISON:  Right knee radiographs performed 06/02/2015 FINDINGS: There is no evidence of fracture or dislocation. 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 A small knee joint effusion is noted. No definite soft tissue abnormalities are otherwise seen. IMPRESSION: 1. No evidence of fracture or dislocation. Total knee arthroplasty is grossly unremarkable in appearance, without evidence of loosening. 2. Small knee joint effusion noted. Electronically Signed   By: Roanna Raider M.D.   On: 11/17/2015 01:22   Ct Abdomen Pelvis W Contrast  Result Date: 11/17/2015 CLINICAL DATA:  55 year old female with abdominal pain. Evaluate for obstruction. EXAM: CT ABDOMEN AND PELVIS WITH  CONTRAST TECHNIQUE: Multidetector CT imaging of the abdomen and pelvis was performed using the standard protocol following bolus administration of intravenous contrast. CONTRAST:  ISOVUE-300 IOPAMIDOL (ISOVUE-300) INJECTION 61% COMPARISON:  Abdominal radiographs dated 11/17/2015 and CT dated 08/14/2015 FINDINGS: Lower chest: Minimal bibasilar haziness, likely atelectatic changes. The visualized lung bases are otherwise clear. No intra-abdominal free air or free fluid. Hepatobiliary: Probable mild fatty infiltration of the liver. No intrahepatic biliary ductal dilatation. There is a 1.8 cm stone or clusters of smaller stones in the gallbladder neck. No pericholecystic fluid. Pancreas: Unremarkable. No pancreatic ductal dilatation or surrounding inflammatory changes. Spleen: Normal in size without focal abnormality. Adrenals/Urinary Tract: Adrenal glands are unremarkable. Kidneys are normal, without  renal calculi, focal lesion, or hydronephrosis. Bladder is unremarkable. Stomach/Bowel: There is postsurgical changes of the anterior abdominal wall with a midline vertical anterior abdominal wall incisional scar. There is abutment of loops of small bowel to the anterior peritoneal wall compatible with adhesions. An air distended loop of distal small bowel with air-fluid level noted in the mid and lower abdomen anteriorly as seen on the prior CTs. This may represent an abnormal chronically dilated segment of bowel versus a segment of bowel with functional dysmotility. A degree of underlying bowel obstruction is less likely but not excluded. There is no active bowel inflammation. Vascular/Lymphatic: There is mild aortoiliac atherosclerotic disease. The origins of the celiac axis, SMA, IMA as well as the origins of the renal arteries are patent. No portal venous gas identified. There is no adenopathy. Reproductive: Hysterectomy. Other: None Musculoskeletal: No acute or significant osseous findings. IMPRESSION: Chronically dilated air-filled distal small bowel loop in the mid and lower abdomen may represent chronic ileus or related to functional dysmotility. A degree of small-bowel obstruction is not entirely excluded. There is postsurgical changes of anterior abdominal wall with adhesion of this loop of bowel to the anterior peritoneum. Cholelithiasis. Electronically Signed   By: Elgie Collard M.D.   On: 11/17/2015 06:17   Nm Hepato W/eject Fract  Result Date: 11/20/2015 CLINICAL DATA:  55 year old female inpatient with intractable nausea and vomiting. Cholelithiasis. EXAM: NUCLEAR MEDICINE HEPATOBILIARY IMAGING WITH GALLBLADDER EF TECHNIQUE: Sequential images of the abdomen were obtained out to 60 minutes following intravenous administration of radiopharmaceutical. After oral ingestion of Ensure, gallbladder ejection fraction was determined. At 60 min, normal ejection fraction is greater than 33%.  RADIOPHARMACEUTICALS:  5.2 mCi Tc-36m  Choletec IV COMPARISON:  11/19/2015 abdominal sonogram and 11/17/2015 CT abdomen/pelvis. FINDINGS: Prompt uptake and biliary excretion of activity by the liver is seen. Gallbladder activity is visualized, consistent with patency of cystic duct. Biliary activity passes into small bowel, consistent with patent common bile duct. Calculated gallbladder ejection fraction is 19%. (Normal gallbladder ejection fraction with Ensure is greater than 33%.) IMPRESSION: 1. Gallbladder activity visualized, consistent with cystic duct patency. Findings are not consistent with acute cholecystitis. 2. Abnormally low gallbladder ejection fraction, consistent with hypokinesia, which can be seen in the setting of chronic cholecystitis. 3. Biliary activity transit into the small bowel, consistent with patent common bile duct. Electronically Signed   By: Delbert Phenix M.D.   On: 11/20/2015 10:32   Dg Abdomen Acute W/chest  Result Date: 11/17/2015 CLINICAL DATA:  Acute onset of generalized abdominal pain and nausea. Initial encounter. EXAM: DG ABDOMEN ACUTE W/ 1V CHEST COMPARISON:  Chest and abdominal radiographs performed 08/14/2015 FINDINGS: The lungs are well-aerated and clear. There is no evidence of focal opacification, pleural effusion or pneumothorax. The cardiomediastinal silhouette is within  normal limits. There is diffuse dilatation of the colon, measuring up to 13.8 cm in size. This may reflect a cecal volvulus. Remaining bowel loops are grossly unremarkable. No free intra-abdominal air is identified on the provided upright view. No acute osseous abnormalities are seen; the sacroiliac joints are unremarkable in appearance. IMPRESSION: Diffuse dilatation of the colon, measuring up to 13.8 cm in size. This may reflect a cecal volvulus. Would correlate with the patient's symptoms, and evaluate further as deemed clinically appropriate. No free intra-abdominal air seen. Electronically Signed    By: Roanna Raider M.D.   On: 11/17/2015 01:20   Dg Abd Portable 1v  Result Date: 11/19/2015 CLINICAL DATA:  Small-bowel obstruction, nausea, followup EXAM: PORTABLE ABDOMEN - 1 VIEW COMPARISON:  KUB of 11/18/2015 FINDINGS: Some contrast is scattered throughout the nondistended colon. No small bowel distention is seen. NG tube tip lies in the region of the antrum of the stomach. IMPRESSION: No definite small-bowel obstruction. Contrast is noted throughout the nondistended colon. Electronically Signed   By: Dwyane Dee M.D.   On: 11/19/2015 08:01   Dg Abd Portable 1v-small Bowel Obstruction Protocol-initial, 8 Hr Delay  Result Date: 11/19/2015 CLINICAL DATA:  55 year old female 8 hours status post Gastrografin administration. EXAM: PORTABLE ABDOMEN - 1 VIEW COMPARISON:  Abdominal radiograph dated 11/18/2015 and CT dated 11/17/2015 FINDINGS: Enteric tube partially visualized in similar position as previously likely in the stomach. Oral contrast is noted within the colon. Air distended loop of small bowel again noted in the mid abdomen and right lower pelvic area as seen on the prior CTs. This likely represents chronic process or related to functional dysmotility or postsurgical atony. No free air or radiopaque calculi. The soft tissues and osseous structures are grossly unremarkable. IMPRESSION: Oral contrast throughout the colon. Persistent and chronically air distended loop of small bowel in the mid and lower abdomen. Electronically Signed   By: Elgie Collard M.D.   On: 11/19/2015 01:47   Dg Abd Portable 1v-small Bowel Protocol-position Verification  Result Date: 11/18/2015 CLINICAL DATA:  Bedside nasogastric tube placement. EXAM: PORTABLE ABDOMEN - 1 VIEW COMPARISON:  CT abdomen pelvis yesterday and earlier. Abdominal x-rays yesterday and earlier. FINDINGS: Nasogastric tube tip in the distal body of the stomach. The stomach is decompressed. Persistent marked gaseous distention of a loop of distal  small bowel, likely post surgical atony based on the CT appearance. Bowel gas pattern otherwise unremarkable. IMPRESSION: 1. Nasogastric tube tip in the distal body of the stomach. 2. Chronic moderate gaseous distention of a loop of distal small bowel, likely due to post surgical atony, based on the CT appearance. Electronically Signed   By: Hulan Saas M.D.   On: 11/18/2015 10:09   Dg Abd Portable 1 View  Result Date: 11/17/2015 CLINICAL DATA:  55 year old female with enteric tube placement. EXAM: PORTABLE ABDOMEN - 1 VIEW COMPARISON:  Abdominal radiograph dated 11/17/2015 FINDINGS: There has been interval placement of the partially visualized enteric tube with tip in the left upper abdomen likely within the stomach. Air distended loop of bowel likely transverse colon measures up to 11 cm in diameter. Another air distended loop of bowel in the right hemipelvis noted which may represent the cecum. This findings are relatively similar to the CT dated 08/14/2015. The descending colon demonstrates a normal caliber. No free air identified. No radiopaque calculi. The osseous structures are grossly unremarkable. IMPRESSION: Interval placement of an enteric tube with tip in the left upper abdomen likely in the stomach. Best boy  distended cecum and transverse colon similar in appearance to the CT dated 08/14/2015. Electronically Signed   By: Elgie CollardArash  Radparvar M.D.   On: 11/17/2015 04:55   [place] Abdomen Limited Ruq  Result Date: 11/19/2015 CLINICAL DATA:  Abdominal pain for 4 days.  Known gallstones. EXAM: US ABDOMEN LIMITED - RIGHT UPPER QUADRANT COMPARISON:  CT scan 11/17/2015 FINDINGS: Gallbladder: Dependent cluster of gallstones noted down near the gallbladder neck. Multiple shadowing gallstones. The largest measures 7 mm. No gallbladder wall thickening, pericholecystic fluid or sonographic Murphy sign. Common bile duct: Diameter: 5.1 mm Liver: Normal echogenicity without focal lesion or biliary dilatation.  IMPRESSION: Cholelithiasis without sonographic findings of acute cholecystitis. Electronically Signed   By: Rudie MeyerP.  Gallerani M.D.   On: 11/19/2015 09:51       Filed Weights   11/17/15 0008 11/21/15 2202  Weight: 75.8 kg (167 lb) 96.3 kg (212 lb 4.9 oz)     Microbiology: No results found for this or any previous visit (from the past 240 hour(s)).     Blood Culture    Component Value Date/Time   SDES URINE, CLEAN CATCH 08/13/2007 1837   SPECREQUEST NONE 08/13/2007 1837   CULT INSIGNIFICANT GROWTH 08/13/2007 1837   REPTSTATUS 08/15/2007 FINAL 08/13/2007 1837      Labs: No results found for this or any previous visit (from the past 48 hour(s)).   Lipid Panel  No results found for: CHOL, TRIG, HDL, CHOLHDL, VLDL, LDLCALC, LDLDIRECT   No results found for: HGBA1C   Lab Results  Component Value Date   CREATININE 0.76 11/20/2015     HPI :  55 y.o. BF PMHx Depression, Asthma, Chronic Bronchitis, COPD, Gerd, HLD, HTN,, OA,  SBO related to postsurgical changes in 2003, after her hysterectomy, with recurrent admissions.Her last visit was on 08/14/2015.   Presents with acute onset of abdominal pain since last evening, epigastric, radiating to the lower quadrant, sharp, 9 out of 10.  CT of the abdomen and pelvis shows Chronically dilated air-filled distal small bowel loop in the mid and lower abdomen may represent chronic ileus or related to functional dysmotility. A degree of small-bowel obstruction is not entirely excluded. There is postsurgical changes of anterior abdominal wall with adhesion of this loop of bowel to the anterior peritoneum.  General surgery consulted    HOSPITAL COURSE:    Cholelithiasis 1.8 cm stone or clusters of smaller stones in the gallbladder neck -CT scan shows large stone in the gallbladder neck see results below Right upper quadrant ultrasound showed multiple gallstones, no evidence of acute cholecystitis, common bile duct diameter 5.1 mm,   HIDA shows chronic cholecystitis ?chronic biliary dyskinesia  No indication for surgery at this time as per surgery   SBO vs chronic ileus or functional dysmotility - history of multiple abdominal surgeries, and recurrent SBO's. Most recent admission 08/14/15. - CT scan shows chronically dilated air-filled distal small bowel loop in the mid and lower abdomen may represent chronic ileus or related to functional dysmotility. A degree of small-bowel obstruction is not entirely excluded.  Small bowel protocol shows oral contrast throughout the colon, -Discontinued NGT, antiemetics, continue to advance to regular diet General surgery following,SBO resolving., Patient tolerated soft diet prior to discharge   Abdominal pain  -Resolved with placement of NG tube to suction  -Chronic Small Bowel Obstruction per CT abdomen, chronic in nature. Marland Kitchen.   advance diet per surgery    GERD, -no acute symptoms: -Continue PPI  COPD not on exacerbation -Albuterol nebulizer PRN -  Dulera 200-5 micrograms BID -Spiriva 18 g daily  Hypertension BP stable Continue outpatient medications   Anxiety/Depression -Ativan PRN -Zoloft 100 mg daily      Discharge Exam:   Blood pressure (!) 114/57, pulse 81, temperature 98.8 F (37.1 C), temperature source Oral, resp. rate 17, height 5\' 2"  (1.575 m), weight 96.3 kg (212 lb 4.9 oz), SpO2 99 %.  General: A/O 4, NAD, No acute respiratory distress Eyes: negative scleral hemorrhage, negative anisocoria, negative icterus ENT: Negative Runny nose, negative gingival bleeding, NG tube present to suction positive bilious fluid in canister Neck:  Negative scars, masses, torticollis, lymphadenopathy, JVD Lungs: Clear to auscultation bilaterally without wheezes or crackles Cardiovascular: Regular rate and rhythm without murmur gallop or rub normal S1 and S2 Abdomen: negative abdominal pain, nondistended, positive soft, bowel sounds, no rebound, no ascites, no  appreciable mass Extremities: No significant cyanosis, clubbing, or edema bilateral lower extremities Skin: Negative rashes, lesions, ulcers Psychiatric:  Negative depression, negative anxiety, negative fatigue, negative mania  Central nervous system:  Cranial nerves II through XII intact, tongue/uvula midline, all extremities muscle strength 5/5, sensation intact throughout, negative dysarthria, negative expressive aphasia, negative receptive aphasia.    Follow-up Information    BYERLY,FAERA, MD. Call on 11/21/2015.   Specialty:  General Surgery Why:  4-6 weeks Contact information: [address] [place] [address] [phone]        Primary care provider. Schedule an appointment as soon as possible for a visit in 2 day(s).   Why:  Hospital follow-up          Signed: Vanessa Alesi 11/22/2015, 8:10 AM        Time spent >45 mins

## 2015-12-01 IMAGING — DX DG ABDOMEN 2V
3 series · 3 of 3 positions shown · non-contrast
Comparison: March 23, 2014.

CLINICAL DATA: Small bowel obstruction.

EXAM:
ABDOMEN - 2 VIEW

[abdomen erect]
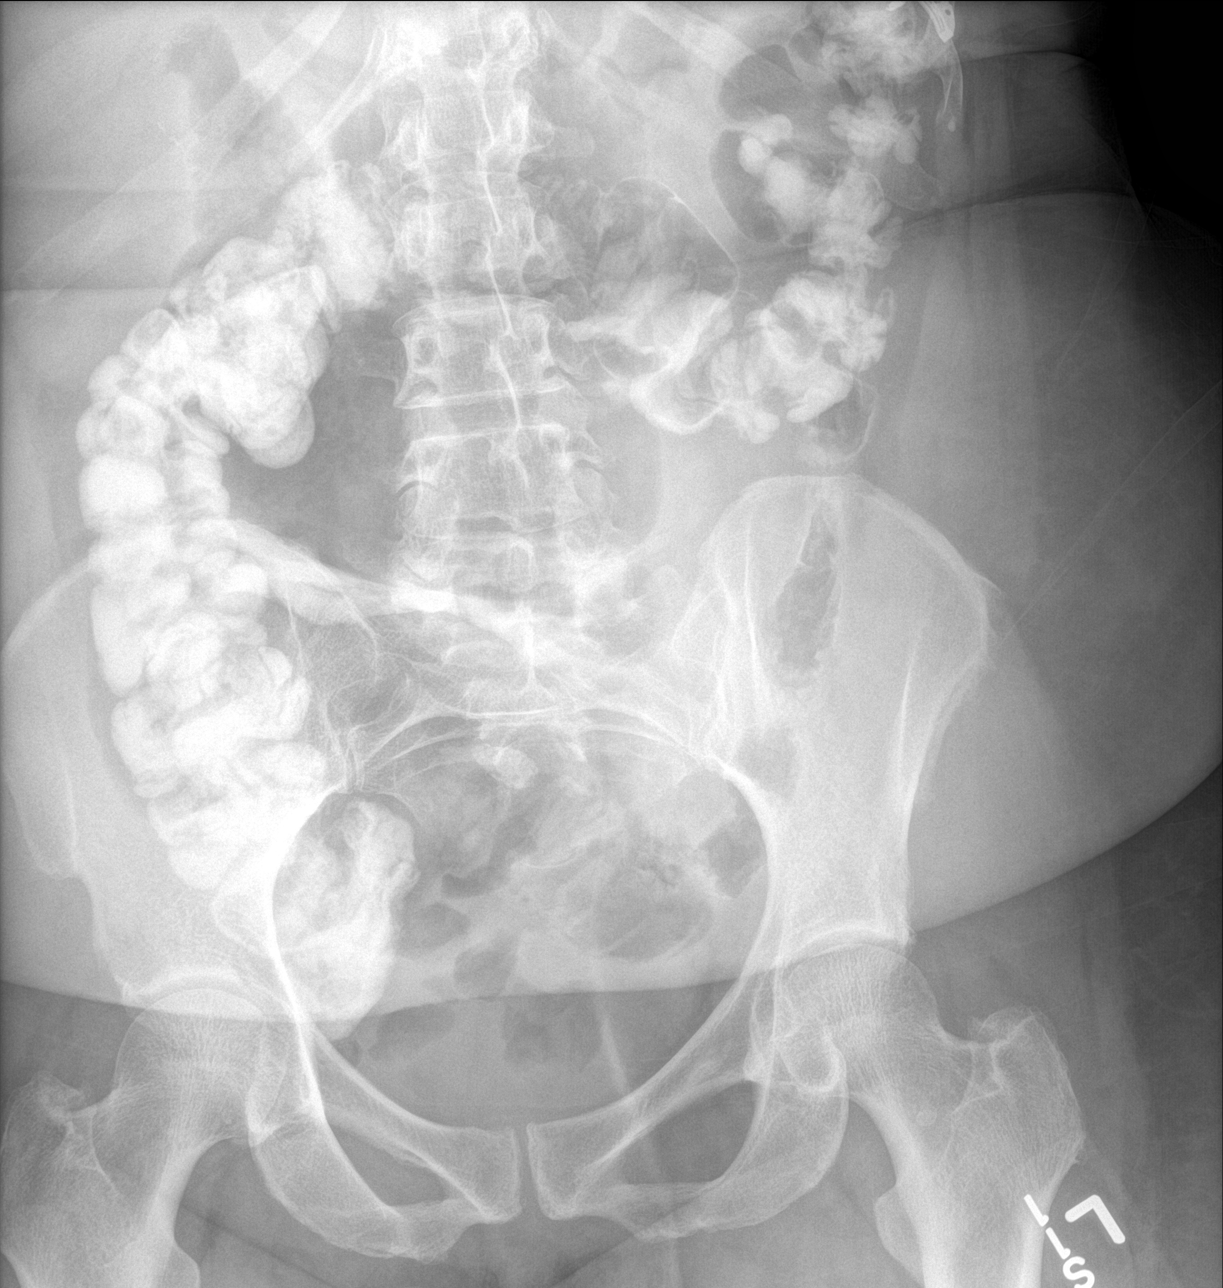

[abdomen supine]
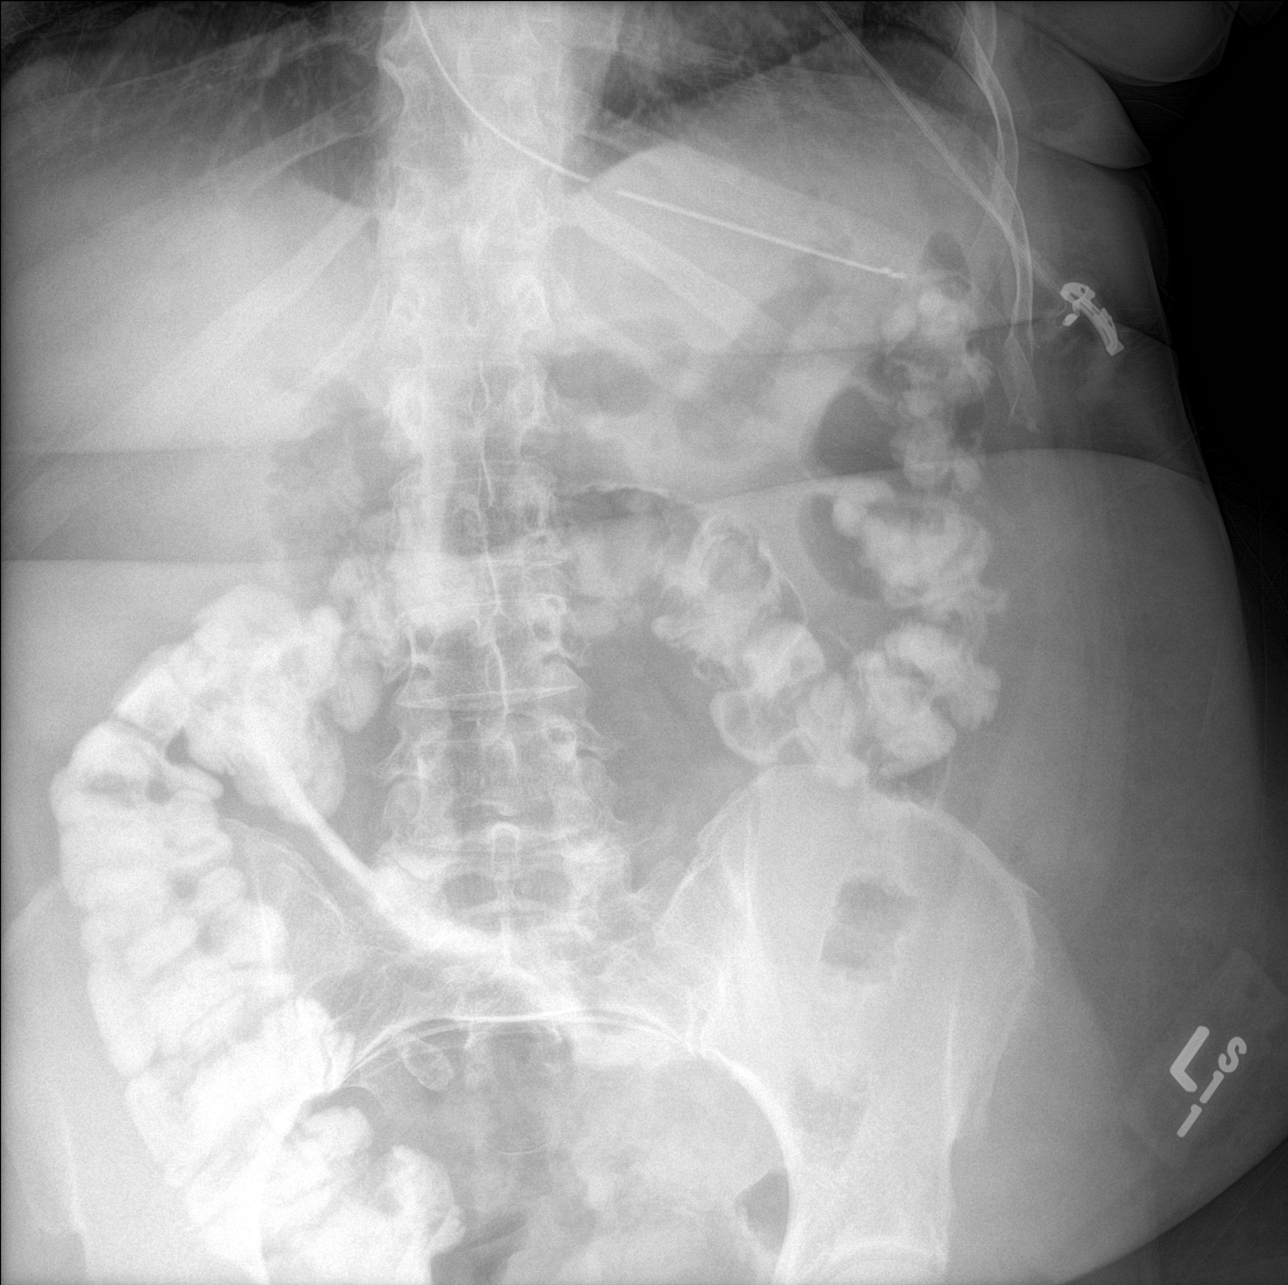

[abdomen decu]
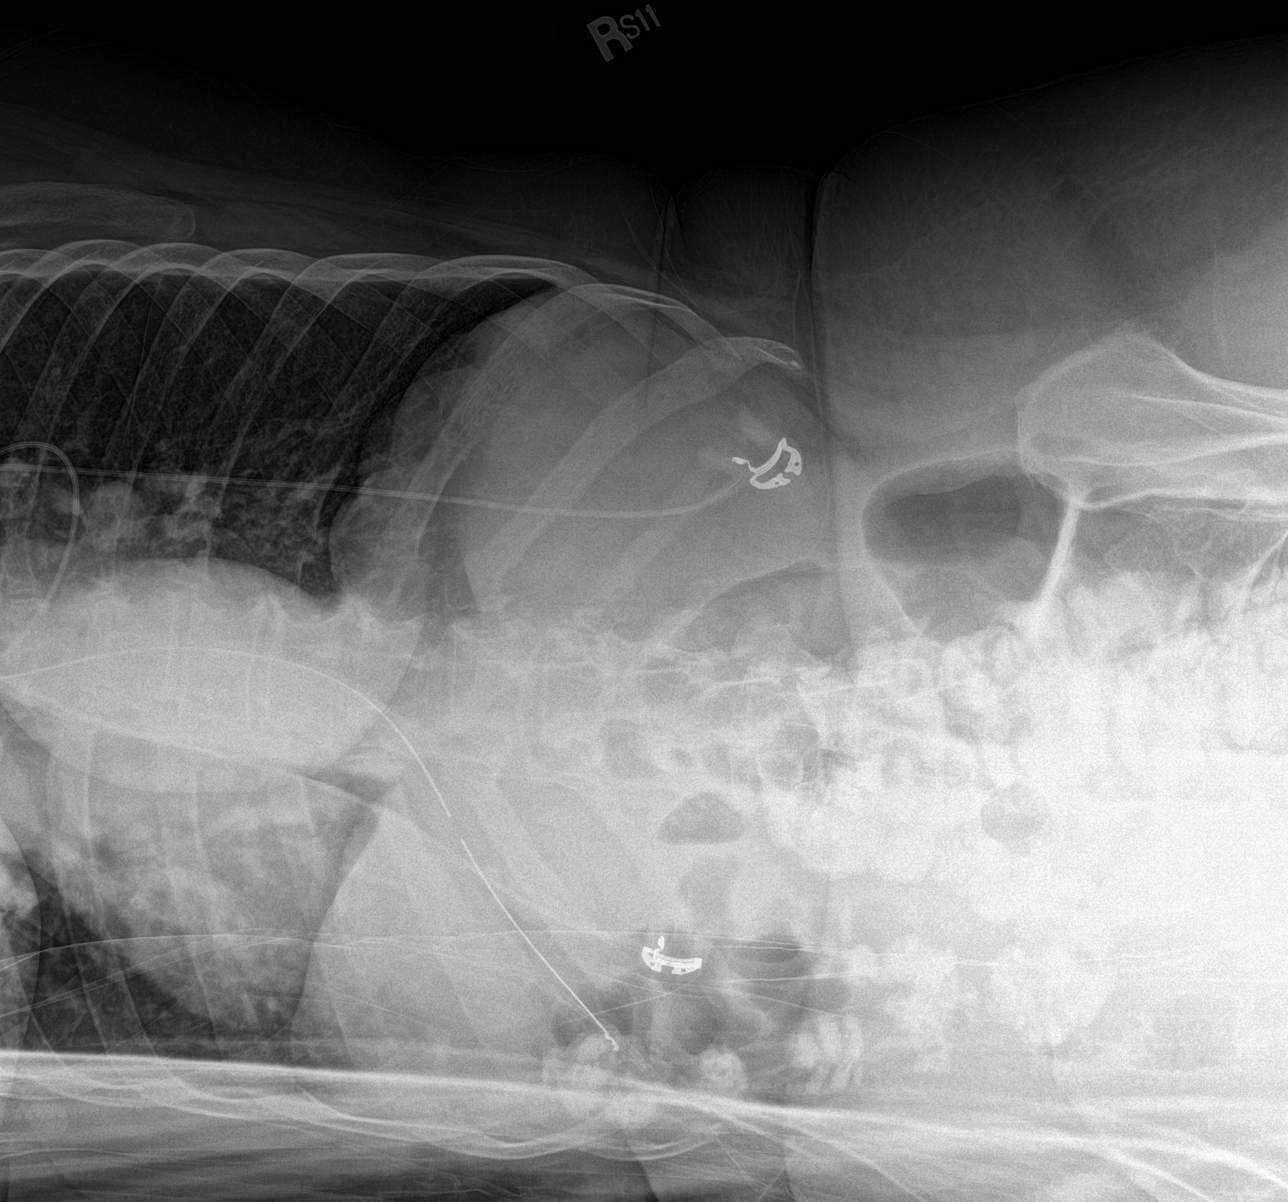

[3 of 3 positions shown; findings below may reference images not displayed]

FINDINGS: Residual contrast remains with right and transverse colon.
Nasogastric tube tip is seen in proximal stomach. No
pneumoperitoneum is noted. Dilated loop of small bowel noted on
prior exam remains, but is slightly less prominent.
IMPRESSION: Slightly less prominent dilated small bowel loop is seen in central
portion of abdomen, but findings are still concerning for ileus or
partial small bowel obstruction.

## 2015-12-02 IMAGING — DX DG ABDOMEN 2V
2 series · 2 of 2 positions shown · non-contrast
Comparison: CT abdomen and pelvis 03/22/2014. Plain films the
abdomen 03/22/2014 and 03/24/2014.

CLINICAL DATA: Small bowel obstruction.

EXAM:
ABDOMEN - 2 VIEW

[abdomen erect]
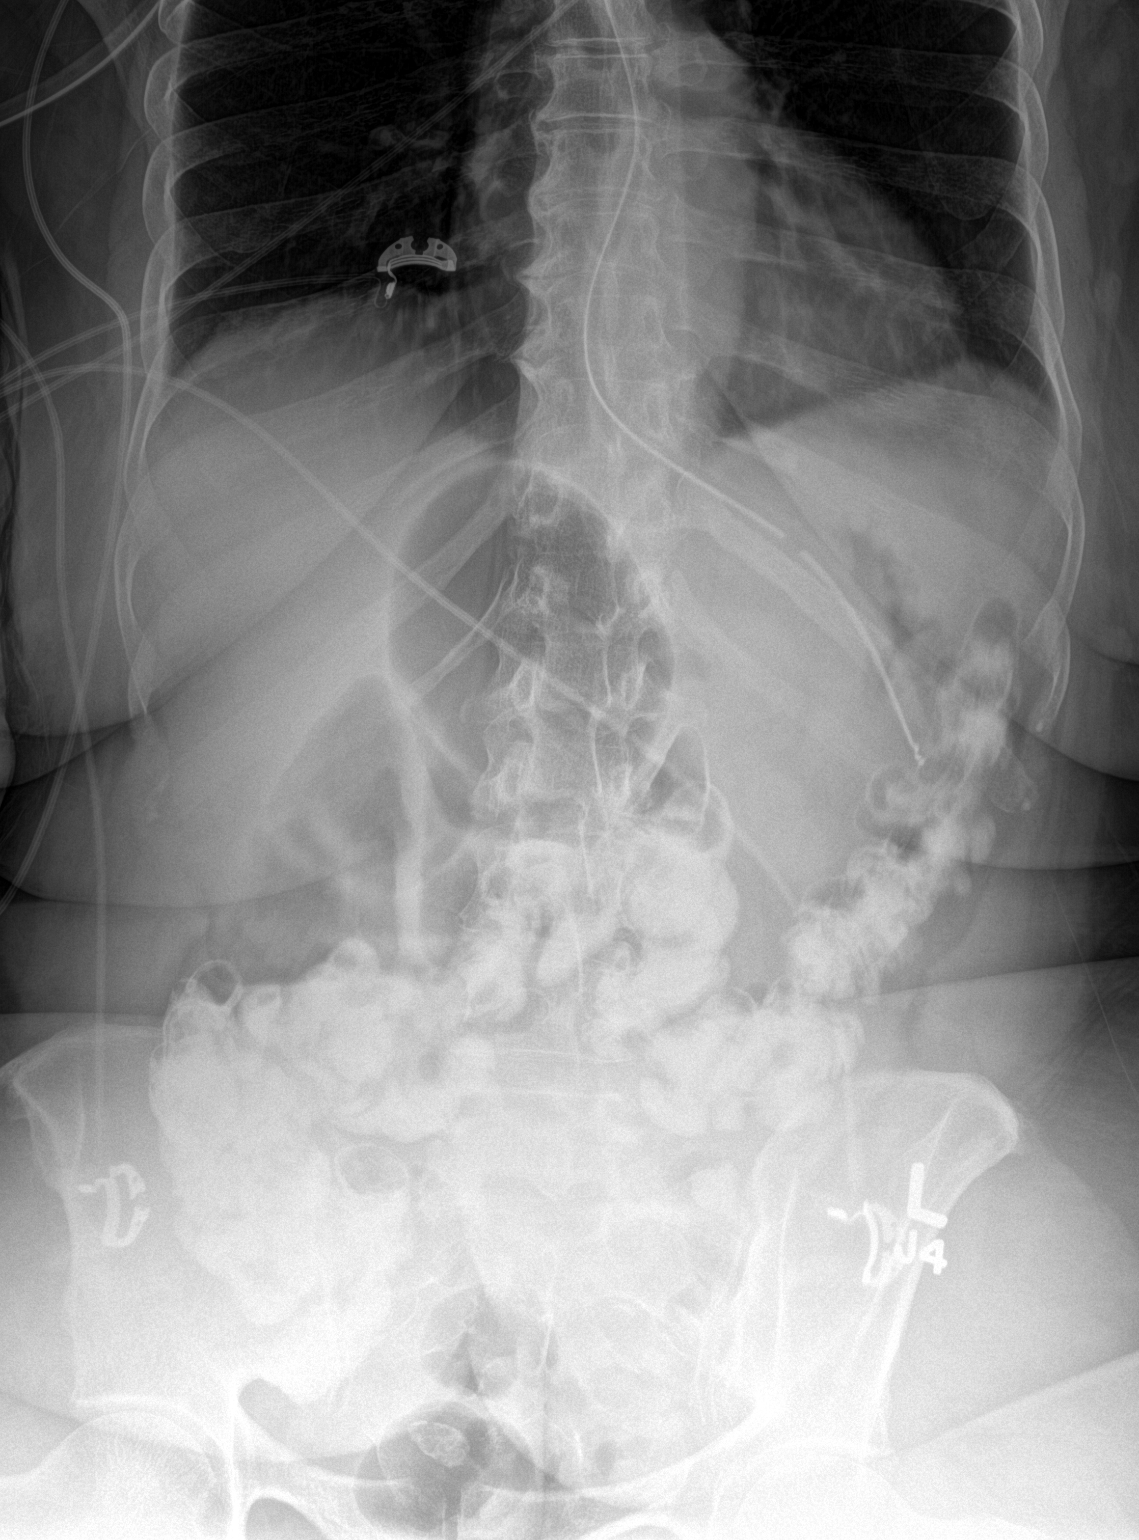

[abdomen supine]
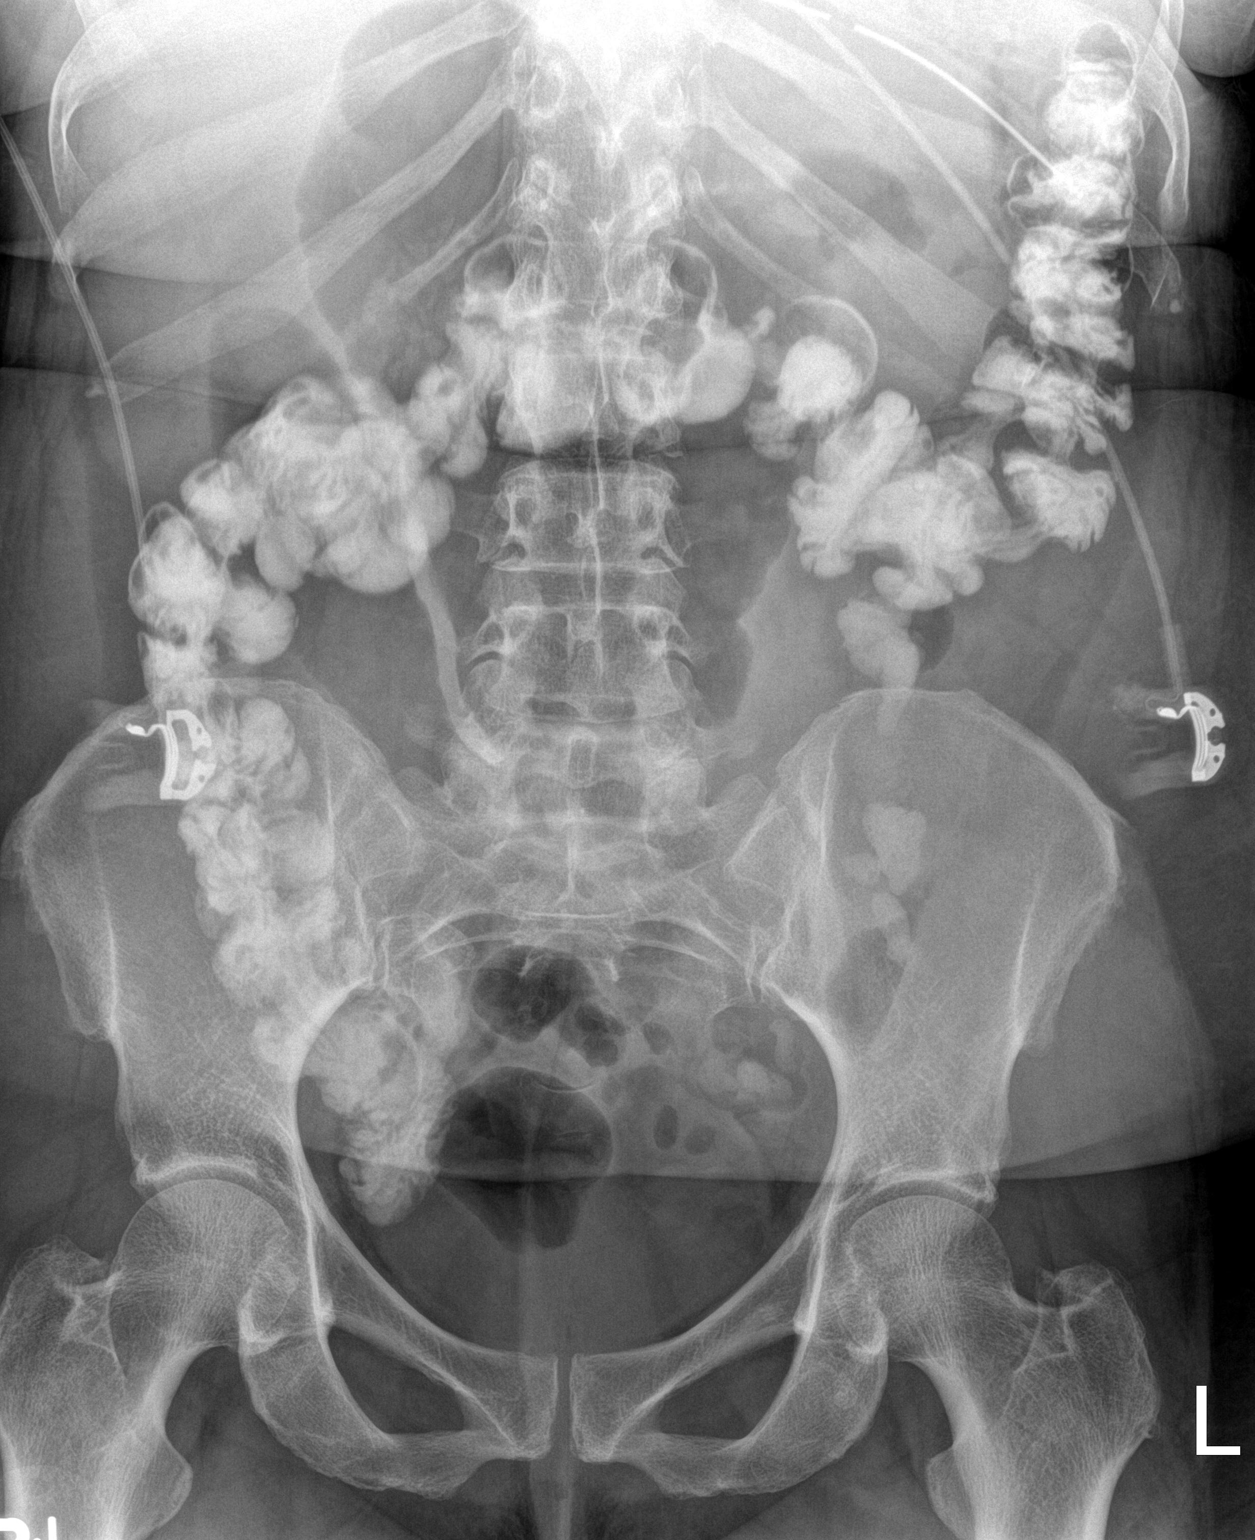

[2 of 2 positions shown; findings below may reference images not displayed]

FINDINGS: NGT remains in place in good position. Contrast is again seen
throughout the colon. Distended loop of small bowel in the mid
abdomen measures approximately 7.6 cm compared to 9.3 cm on the most
recent exam. No new abnormality is identified.
IMPRESSION: Slight decrease in gaseous distention of a loop of small bowel in
the mid abdomen which may reflect an atonic segment or possibly
partial small bowel obstruction.

## 2016-01-06 ENCOUNTER — Other Ambulatory Visit: Payer: Self-pay | Admitting: Specialist

## 2016-01-06 DIAGNOSIS — Z1231 Encounter for screening mammogram for malignant neoplasm of breast: Secondary | ICD-10-CM

## 2016-02-25 ENCOUNTER — Ambulatory Visit: Payer: Medicare HMO

## 2016-02-27 ENCOUNTER — Emergency Department (HOSPITAL_COMMUNITY): Payer: Medicare HMO

## 2016-02-27 ENCOUNTER — Emergency Department (HOSPITAL_COMMUNITY)
Admission: EM | Admit: 2016-02-27 | Discharge: 2016-02-28 | Disposition: A | Discharge status: Home / Self Care | Payer: Medicare HMO | Attending: Emergency Medicine | Admitting: Emergency Medicine

## 2016-02-27 ENCOUNTER — Encounter (HOSPITAL_COMMUNITY): Payer: Self-pay | Admitting: Nurse Practitioner

## 2016-02-27 DIAGNOSIS — R1030 Lower abdominal pain, unspecified: Secondary | ICD-10-CM | POA: Diagnosis present

## 2016-02-27 DIAGNOSIS — Z91010 Allergy to peanuts: Secondary | ICD-10-CM | POA: Insufficient documentation

## 2016-02-27 DIAGNOSIS — Z91040 Latex allergy status: Secondary | ICD-10-CM | POA: Insufficient documentation

## 2016-02-27 DIAGNOSIS — I10 Essential (primary) hypertension: Secondary | ICD-10-CM | POA: Insufficient documentation

## 2016-02-27 DIAGNOSIS — Z96651 Presence of right artificial knee joint: Secondary | ICD-10-CM | POA: Insufficient documentation

## 2016-02-27 DIAGNOSIS — J449 Chronic obstructive pulmonary disease, unspecified: Secondary | ICD-10-CM | POA: Diagnosis not present

## 2016-02-27 LAB — COMPREHENSIVE METABOLIC PANEL
ALK PHOS: 87 U/L (ref 38–126)
ALT: 19 U/L (ref 14–54)
ANION GAP: 8 (ref 5–15)
AST: 23 U/L (ref 15–41)
Albumin: 3.7 g/dL (ref 3.5–5.0)
BUN: 8 mg/dL (ref 6–20)
CALCIUM: 9.9 mg/dL (ref 8.9–10.3)
CO2: 27 mmol/L (ref 22–32)
CREATININE: 0.67 mg/dL (ref 0.44–1.00)
Chloride: 107 mmol/L (ref 101–111)
Glucose, Bld: 80 mg/dL (ref 65–99)
Potassium: 3.8 mmol/L (ref 3.5–5.1)
Sodium: 142 mmol/L (ref 135–145)
TOTAL PROTEIN: 7.3 g/dL (ref 6.5–8.1)
Total Bilirubin: 0.3 mg/dL (ref 0.3–1.2)

## 2016-02-27 LAB — CBC
HEMATOCRIT: 41.5 % (ref 36.0–46.0)
HEMOGLOBIN: 13.5 g/dL (ref 12.0–15.0)
MCH: 28.1 pg (ref 26.0–34.0)
MCHC: 32.5 g/dL (ref 30.0–36.0)
MCV: 86.5 fL (ref 78.0–100.0)
Platelets: 286 10*3/uL (ref 150–400)
RBC: 4.8 MIL/uL (ref 3.87–5.11)
RDW: 14.3 % (ref 11.5–15.5)
WBC: 5 10*3/uL (ref 4.0–10.5)

## 2016-02-27 LAB — LIPASE, BLOOD: Lipase: 19 U/L (ref 11–51)

## 2016-02-27 MED ORDER — FENTANYL CITRATE (PF) 100 MCG/2ML IJ SOLN
50.0000 ug | Freq: Once | INTRAMUSCULAR | Status: AC
  Administered 2016-02-27: 50 ug via INTRAVENOUS
  Filled 2016-02-27: qty 2

## 2016-02-27 MED ORDER — IOPAMIDOL (ISOVUE-300) INJECTION 61%
INTRAVENOUS | Status: AC
  Administered 2016-02-27: 100 mL
  Filled 2016-02-27: qty 100

## 2016-02-27 MED ORDER — SODIUM CHLORIDE 0.9 % IV BOLUS (SEPSIS): 1000.0000 mL | Freq: Once | INTRAVENOUS | Status: DC

## 2016-02-27 NOTE — ED Notes (Signed)
Attempted lab draw x 1, unsuccessful.  

## 2016-02-27 NOTE — ED Triage Notes (Signed)
Pt presents with c/o abd pain. The pain has been intermittent over past few weeks. Her doctor diagnosed with gallstones and gave her a prescription to take to "help the swelling go down." her pain was relieved for a period of time but has returned today. She reports nausea, decreased urinary output. She denies fevers, constipation, diarrhea.

## 2016-02-27 NOTE — ED Notes (Signed)
Attempted PIV x2

## 2016-02-27 NOTE — ED Notes (Signed)
Pt transported to CT ?

## 2016-02-27 NOTE — ED Notes (Signed)
Pt made aware of need for urine sample.  Does not want to go to restroom to try for RN.  States she will need something to drink to provide.

## 2016-02-27 NOTE — ED Provider Notes (Signed)
[facility]-EMERGENCY DEPT Provider Note   CSN: [id number] Arrival date & time: 02/27/16  1418     History   Chief Complaint Chief Complaint  Patient presents with  . Abdominal Pain    HPI [name] is a 56 y.o. female.  This is a 56 year old female with a history of small bowel obstruction who  presents with abdominal pain in the left lower quadrant and suprapubic area since Janurary 1, 2018 its been intermittent in intensity.  She did travel to [place] and returned 1 day ago   She states she's been drinking fluids up to 18, 6 ounce bottles of water a day and still having very little urinary output.  She denies any dysuria, frequency, diarrhea, nausea or vomiting.  No fever, cough, no peripheral edema      Past Medical History:  Diagnosis Date  . Asthma   . Cataract   . Chronic bronchitis (HCC)   . Chronic lower back pain   . COPD (chronic obstructive pulmonary disease) (HCC)   . Depression   . GERD (gastroesophageal reflux disease)   . Heart murmur   . History of blood transfusion 2003-2004   "related to bowel obstruction OR"  . Hyperlipidemia   . Hypertension   . Insomnia   . Osteoarthritis    "lower back; both hips; right leg" (08/01/2014)  . SBO (small bowel obstruction) 10/2015    Patient Active Problem List   Diagnosis Date Noted  . Encounter for imaging study to confirm nasogastric (NG) tube placement   . Choledocholithiasis   . Calculus of gallbladder with biliary obstruction but without cholecystitis   . Small bowel obstruction   . Essential hypertension   . Gastroesophageal reflux disease with esophagitis   . Simple chronic bronchitis (HCC)   . SBO (small bowel obstruction) 08/14/2015  . Hypokalemia 08/14/2015  . Abdominal pain, lower   . Epigastric pain   . Partial small bowel obstruction 03/22/2014  . Abdominal pain 03/22/2014  . COPD (chronic obstructive pulmonary disease) (HCC)   . Hypertension   . GERD (gastroesophageal reflux disease)   .  Hyperlipidemia   . Osteoarthritis     Past Surgical History:  Procedure Laterality Date  . ABDOMINAL EXPLORATION SURGERY  2004   abdominal compartment syndrome  . ABDOMINAL EXPLORATION SURGERY  2003   small bowel prolapse through vagina  . ABDOMINAL HYSTERECTOMY  2003  . ADENOIDECTOMY  2002  . BOWEL RESECTION  2003; 2004   x2 with primary anastamosis  . COMBINED HYSTEROSCOPY DIAGNOSTIC / D&C  ~ 2003  . HERNIA REPAIR    . JOINT REPLACEMENT    . LAPAROSCOPIC INCISIONAL / UMBILICAL / VENTRAL HERNIA REPAIR  2012   [facility], Dr. Carolynn Sayers  . TONSILLECTOMY  1977  . TOTAL KNEE ARTHROPLASTY Right 03/2012   [facility]    OB History    No data available       Home Medications    Prior to Admission medications   Medication Sig Start Date End Date Taking? Authorizing Provider  albuterol (PROVENTIL HFA;VENTOLIN HFA) 108 (90 BASE) MCG/ACT inhaler Inhale 1 puff into the lungs every 6 (six) hours as needed for wheezing or shortness of breath (wheezing).    Yes Historical Provider, MD  amLODipine (NORVASC) 10 MG tablet Take 10 mg by mouth daily.   Yes Historical Provider, MD  diclofenac sodium (VOLTAREN) 1 % GEL Apply 2 g topically 4 (four) times daily as needed (knee pain).    Yes Historical Provider, MD  diphenhydrAMINE (BENADRYL) 25 MG tablet Take 25 mg by mouth every 6 (six) hours as needed for allergies (allergies).   Yes Historical Provider, MD  esomeprazole (NEXIUM) 40 MG capsule Take 40 mg by mouth daily at 12 noon.   Yes Historical Provider, MD  Fluticasone-Salmeterol (ADVAIR) 500-50 MCG/DOSE AEPB Inhale 1 puff into the lungs 2 (two) times daily.   Yes Historical Provider, MD  gabapentin (NEURONTIN) 300 MG capsule Take 300 mg by mouth 3 (three) times daily.   Yes Historical Provider, MD  lisinopril (PRINIVIL,ZESTRIL) 20 MG tablet Take 20 mg by mouth daily.   Yes Historical Provider, MD  loratadine (CLARITIN) 10 MG tablet Take 10 mg by mouth daily.   Yes Historical Provider, MD  LORazepam  (ATIVAN) 0.5 MG tablet Take 0.5 mg by mouth every 6 (six) hours as needed for anxiety (anxiety).    Yes Historical Provider, MD  meloxicam (MOBIC) 15 MG tablet Take 15 mg by mouth daily as needed for pain.  09/18/14  Yes Historical Provider, MD  mometasone (NASONEX) 50 MCG/ACT nasal spray USE 1 PUFF IN THE NOSTRILS TWICE A DAY AS NEEDED FOR CONGESTION 07/14/14  Yes Historical Provider, MD  Multiple Vitamin (MULTIVITAMIN WITH MINERALS) TABS tablet Take 1 tablet by mouth daily.   Yes Historical Provider, MD  polyethylene glycol (MIRALAX / GLYCOLAX) packet Take 17 g by mouth daily as needed for mild constipation or moderate constipation (constipation).    Yes Historical Provider, MD  promethazine (PHENERGAN) 25 MG tablet Take 25 mg by mouth every 8 (eight) hours as needed for nausea or vomiting (nausea).  08/20/13  Yes Historical Provider, MD  rosuvastatin (CRESTOR) 10 MG tablet Take 10 mg by mouth daily.   Yes Historical Provider, MD  senna (SENOKOT) 8.6 MG TABS tablet Take 1 tablet (8.6 mg total) by mouth daily. 11/22/15  Yes [name], MD  sertraline (ZOLOFT) 100 MG tablet Take 100 mg by mouth daily.   Yes Historical Provider, MD  tiotropium (SPIRIVA) 18 MCG inhalation capsule Place 18 mcg into inhaler and inhale daily.   Yes Historical Provider, MD  traMADol (ULTRAM) 50 MG tablet Take 1 tablet (50 mg total) by mouth every 6 (six) hours as needed. 06/02/15  Yes Samantha Tripp Dowless, PA-C  traZODone (DESYREL) 100 MG tablet Take 100 mg by mouth at bedtime as needed for sleep.  06/09/14  Yes Historical Provider, MD  triamterene-hydrochlorothiazide (MAXZIDE-25) 37.5-25 MG tablet Take 1 tablet by mouth daily. 11/06/14  Yes Historical Provider, MD  bisacodyl (DULCOLAX) 10 MG suppository Place 1 suppository (10 mg total) rectally daily as needed for moderate constipation. Patient not taking: Reported on 02/27/2016 08/03/14   [name], MD  ipratropium (ATROVENT) 0.06 % nasal spray Place 2 sprays into the nose  4 (four) times daily. Patient not taking: Reported on 02/27/2016 05/05/13   Linna Hoff, MD  methocarbamol (ROBAXIN) 500 MG tablet Take 1 tablet (500 mg total) by mouth 2 (two) times daily as needed (back pain). Patient not taking: Reported on 02/27/2016 03/04/15   Carlene Coria, PA-C    Family History Family History  Problem Relation Age of Onset  . Cancer Mother   . Heart attack Father   . Colon cancer Maternal Grandmother   . Colon cancer Paternal Grandmother     Social History Social History  Substance Use Topics  . Smoking status: Never Smoker  . Smokeless tobacco: Never Used  . Alcohol use No     Allergies   Bee venom;  Peanuts [peanut oil]; Shellfish allergy; Aspirin; Corn-containing products; Ibuprofen; Latex; Morphine and related; and Zofran [ondansetron hcl]   Review of Systems Review of Systems  Constitutional: Negative for chills and fever.  Respiratory: Negative for shortness of breath.   Cardiovascular: Negative for chest pain and leg swelling.  Gastrointestinal: Positive for abdominal pain. Negative for abdominal distention, constipation, diarrhea, nausea and vomiting.  Genitourinary: Positive for decreased urine volume. Negative for dysuria and frequency.  All other systems reviewed and are negative.    Physical Exam Updated Vital Signs BP 149/92   Pulse 78   Temp 99.2 F (37.3 C) (Oral)   Resp 18   SpO2 99%   Physical Exam  Constitutional: She appears well-developed and well-nourished. No distress.  HENT:  Head: Normocephalic.  Eyes: Pupils are equal, round, and reactive to light.  Neck: Normal range of motion.  Cardiovascular: Normal rate and regular rhythm.   Pulmonary/Chest: Effort normal and breath sounds normal.  Abdominal: Soft. Bowel sounds are normal. She exhibits no distension. There is tenderness in the suprapubic area and left lower quadrant. There is no rebound and no guarding.    Nursing note and vitals reviewed.    ED Treatments  / Results  Labs (all labs ordered are listed, but only abnormal results are displayed) Labs Reviewed  CBC  COMPREHENSIVE METABOLIC PANEL  LIPASE, BLOOD  URINALYSIS, ROUTINE W REFLEX MICROSCOPIC    EKG  EKG Interpretation None       Radiology Ct Abdomen Pelvis W Contrast  Result Date: 02/28/2016 CLINICAL DATA:  Lower abdominal pain and diarrhea for 2 days. EXAM: CT ABDOMEN AND PELVIS WITH CONTRAST TECHNIQUE: Multidetector CT imaging of the abdomen and pelvis was performed using the standard protocol following bolus administration of intravenous contrast. CONTRAST:  100 ISOVUE-300 IOPAMIDOL (ISOVUE-300) INJECTION 61% COMPARISON:  11/17/2015, 08/14/2015 FINDINGS: Lower chest: No acute abnormality. Hepatobiliary: Multiple small calculi within the gallbladder lumen. No CT evidence of acute cholecystitis. No bile duct dilatation. No focal liver lesions. Pancreas: Unremarkable. No pancreatic ductal dilatation or surrounding inflammatory changes. Spleen: Normal in size without focal abnormality. Adrenals/Urinary Tract: Adrenal glands are unremarkable. Kidneys are normal, without renal calculi, focal lesion, or hydronephrosis. Bladder is unremarkable. Stomach/Bowel: Unchanged markedly dilated small bowel in the anterior abdomen, midline and to the right. This is been stable over the past several examinations. No evidence of bowel obstruction. No focal inflammatory change involving bowel. Colon is remarkable only for generous volume of stool and air. Stomach is unremarkable. Vascular/Lymphatic: The abdominal aorta is normal in caliber without atherosclerotic calcification. Mild calcified plaque at the common iliac artery origins. No adenopathy in the abdomen or pelvis. Reproductive: Status post hysterectomy. No adnexal masses. Other: No acute inflammation.  No ascites. Musculoskeletal: No significant skeletal lesion. IMPRESSION: Chronically dilated small bowel in the anterior abdomen without significant  change. No evidence of bowel obstruction, perforation or focal inflammation. Cholelithiasis without CT evidence of cholecystitis. Electronically Signed   By: Ellery Plunkaniel R Mitchell M.D.   On: 02/28/2016 00:30   Dg Abd Acute W/chest  Result Date: 02/27/2016 CLINICAL DATA:  Intermittent abdominal pain for several weeks. EXAM: DG ABDOMEN ACUTE W/ 1V CHEST COMPARISON:  CT from 11/17/2015, KUB from 11/18/2015 and 11/19/2015 FINDINGS: Heart is top-normal in size. There is aortic atherosclerosis slight uncoiling of the thoracic aorta. Lungs are free of pneumonic consolidations, effusions or pneumothoraces. Similar moderate-to-marked gaseous distention of bowel in the mid to right hemiabdomen noted to be of small bowel in etiology by CT is again  seen. Superimposed on the dilated loops of small bowel are stool filled nondistended large bowel loops. Findings may represent chronically dilated small bowel with functional dysmotility. IMPRESSION: Negative chest radiographs. Similar moderate to marked distention of bowel loops presumed to be small bowel in etiology based on prior CT with overlying nonobstructed, nondistended stool filled large bowel. 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 Bowel obstruction cannot be entirely excluded. No free air. Electronically Signed   By: Tollie Eth M.D.   On: 02/27/2016 22:17    Procedures Procedures (including critical care time)  Medications Ordered in ED Medications  fentaNYL (SUBLIMAZE) injection 50 mcg (50 mcg Intravenous Given 02/27/16 2334)  iopamidol (ISOVUE-300) 61 % injection (100 mLs  Contrast Given 02/27/16 2351)     Initial Impression / Assessment and Plan / ED Course  I have reviewed the triage vital signs and the nursing notes.  Pertinent labs & imaging results that were available during my care of the patient were reviewed by me and considered in my medical decision making (see chart for details).  Clinical Course    She was evaluated  for recurrent left lower quadrant and suprapubic discomfort.  CT scan shows no bowel obstruction.  Urine is normal.  Electrolytes, normal kidney function are normal.  White count.  We will normal.  Cannot explain this patient's recurrent pain.  There is no sign of infection    Final Clinical Impressions(s) / ED Diagnoses   Final diagnoses:  Lower abdominal pain    New Prescriptions New Prescriptions   No medications on file     Earley Favor, NP 02/28/16 1610    Gwyneth Sprout, MD 02/28/16 1312

## 2016-02-28 DIAGNOSIS — R1030 Lower abdominal pain, unspecified: Secondary | ICD-10-CM | POA: Diagnosis not present

## 2016-02-28 LAB — URINALYSIS, ROUTINE W REFLEX MICROSCOPIC
BILIRUBIN URINE: NEGATIVE
Glucose, UA: NEGATIVE mg/dL
Hgb urine dipstick: NEGATIVE
KETONES UR: NEGATIVE mg/dL
Leukocytes, UA: NEGATIVE
NITRITE: NEGATIVE
PROTEIN: NEGATIVE mg/dL
Specific Gravity, Urine: 1.029 (ref 1.005–1.030)
pH: 7 (ref 5.0–8.0)

## 2016-02-28 NOTE — Discharge Instructions (Signed)
Tonight your evaluated for your recurrent abdominal pain, your CT scan shows no bowel obstruction.  No constipation.  Your urine shows no sign of infection.  Your lab work are all within normal parameters, as well as normal kidney function.  Please call your primary care physician and make an appointment for follow-up.  Continue all your normal home medications

## 2016-02-28 NOTE — ED Notes (Signed)
Pt provided with [place] sandwich.  Okay'd by NP

## 2016-03-12 ENCOUNTER — Ambulatory Visit: Payer: Medicare HMO

## 2016-03-25 ENCOUNTER — Ambulatory Visit: Payer: Medicare HMO

## 2021-11-27 ENCOUNTER — Inpatient Hospital Stay (HOSPITAL_COMMUNITY)
Admission: EM | Admit: 2021-11-27 | Discharge: 2021-12-02 | DRG: 390 | Disposition: A | Discharge status: Home / Self Care | Payer: Medicare Other | Attending: Internal Medicine | Admitting: Internal Medicine

## 2021-11-27 ENCOUNTER — Encounter (HOSPITAL_COMMUNITY): Payer: Self-pay | Admitting: Emergency Medicine

## 2021-11-27 ENCOUNTER — Observation Stay (HOSPITAL_COMMUNITY): Payer: Medicare Other

## 2021-11-27 ENCOUNTER — Emergency Department (HOSPITAL_COMMUNITY): Payer: Medicare Other

## 2021-11-27 ENCOUNTER — Other Ambulatory Visit: Payer: Self-pay

## 2021-11-27 DIAGNOSIS — Z96651 Presence of right artificial knee joint: Secondary | ICD-10-CM | POA: Diagnosis present

## 2021-11-27 DIAGNOSIS — Z79899 Other long term (current) drug therapy: Secondary | ICD-10-CM

## 2021-11-27 DIAGNOSIS — Z885 Allergy status to narcotic agent status: Secondary | ICD-10-CM

## 2021-11-27 DIAGNOSIS — K56609 Unspecified intestinal obstruction, unspecified as to partial versus complete obstruction: Secondary | ICD-10-CM | POA: Diagnosis not present

## 2021-11-27 DIAGNOSIS — Z91010 Allergy to peanuts: Secondary | ICD-10-CM

## 2021-11-27 DIAGNOSIS — E669 Obesity, unspecified: Secondary | ICD-10-CM | POA: Diagnosis present

## 2021-11-27 DIAGNOSIS — J4489 Other specified chronic obstructive pulmonary disease: Secondary | ICD-10-CM | POA: Diagnosis present

## 2021-11-27 DIAGNOSIS — K8020 Calculus of gallbladder without cholecystitis without obstruction: Secondary | ICD-10-CM | POA: Diagnosis present

## 2021-11-27 DIAGNOSIS — R011 Cardiac murmur, unspecified: Secondary | ICD-10-CM | POA: Insufficient documentation

## 2021-11-27 DIAGNOSIS — E785 Hyperlipidemia, unspecified: Secondary | ICD-10-CM | POA: Diagnosis present

## 2021-11-27 DIAGNOSIS — J42 Unspecified chronic bronchitis: Secondary | ICD-10-CM

## 2021-11-27 DIAGNOSIS — M479 Spondylosis, unspecified: Secondary | ICD-10-CM | POA: Diagnosis present

## 2021-11-27 DIAGNOSIS — F419 Anxiety disorder, unspecified: Secondary | ICD-10-CM | POA: Diagnosis present

## 2021-11-27 DIAGNOSIS — I10 Essential (primary) hypertension: Secondary | ICD-10-CM | POA: Diagnosis present

## 2021-11-27 DIAGNOSIS — R1310 Dysphagia, unspecified: Secondary | ICD-10-CM | POA: Diagnosis present

## 2021-11-27 DIAGNOSIS — Z800 Family history of malignant neoplasm of digestive organs: Secondary | ICD-10-CM

## 2021-11-27 DIAGNOSIS — J449 Chronic obstructive pulmonary disease, unspecified: Secondary | ICD-10-CM | POA: Diagnosis present

## 2021-11-27 DIAGNOSIS — F32A Depression, unspecified: Secondary | ICD-10-CM | POA: Diagnosis present

## 2021-11-27 DIAGNOSIS — M545 Low back pain, unspecified: Secondary | ICD-10-CM | POA: Insufficient documentation

## 2021-11-27 DIAGNOSIS — Z888 Allergy status to other drugs, medicaments and biological substances status: Secondary | ICD-10-CM

## 2021-11-27 DIAGNOSIS — Z91013 Allergy to seafood: Secondary | ICD-10-CM

## 2021-11-27 DIAGNOSIS — J45909 Unspecified asthma, uncomplicated: Secondary | ICD-10-CM | POA: Diagnosis present

## 2021-11-27 DIAGNOSIS — K5651 Intestinal adhesions [bands], with partial obstruction: Principal | ICD-10-CM | POA: Diagnosis present

## 2021-11-27 DIAGNOSIS — Z886 Allergy status to analgesic agent status: Secondary | ICD-10-CM

## 2021-11-27 DIAGNOSIS — Z91040 Latex allergy status: Secondary | ICD-10-CM

## 2021-11-27 DIAGNOSIS — Z91030 Bee allergy status: Secondary | ICD-10-CM

## 2021-11-27 DIAGNOSIS — K760 Fatty (change of) liver, not elsewhere classified: Secondary | ICD-10-CM | POA: Diagnosis present

## 2021-11-27 DIAGNOSIS — R112 Nausea with vomiting, unspecified: Secondary | ICD-10-CM

## 2021-11-27 DIAGNOSIS — Z8249 Family history of ischemic heart disease and other diseases of the circulatory system: Secondary | ICD-10-CM

## 2021-11-27 DIAGNOSIS — M1990 Unspecified osteoarthritis, unspecified site: Secondary | ICD-10-CM | POA: Diagnosis present

## 2021-11-27 DIAGNOSIS — Z6831 Body mass index (BMI) 31.0-31.9, adult: Secondary | ICD-10-CM

## 2021-11-27 DIAGNOSIS — N200 Calculus of kidney: Secondary | ICD-10-CM | POA: Diagnosis present

## 2021-11-27 DIAGNOSIS — K219 Gastro-esophageal reflux disease without esophagitis: Secondary | ICD-10-CM | POA: Diagnosis present

## 2021-11-27 DIAGNOSIS — G8929 Other chronic pain: Secondary | ICD-10-CM | POA: Diagnosis present

## 2021-11-27 LAB — COMPREHENSIVE METABOLIC PANEL
ALT: 25 U/L (ref 0–44)
AST: 33 U/L (ref 15–41)
Albumin: 4.1 g/dL (ref 3.5–5.0)
Alkaline Phosphatase: 77 U/L (ref 38–126)
Anion gap: 11 (ref 5–15)
BUN: 14 mg/dL (ref 6–20)
CO2: 25 mmol/L (ref 22–32)
Calcium: 10.1 mg/dL (ref 8.9–10.3)
Chloride: 103 mmol/L (ref 98–111)
Creatinine, Ser: 0.76 mg/dL (ref 0.44–1.00)
GFR, Estimated: >60 mL/min (ref >60)
Glucose, Bld: 105 mg/dL — ABNORMAL HIGH (ref 70–99)
Potassium: 3.8 mmol/L (ref 3.5–5.1)
Sodium: 139 mmol/L (ref 135–145)
Total Bilirubin: 0.7 mg/dL (ref 0.3–1.2)
Total Protein: 6.8 g/dL (ref 6.5–8.1)

## 2021-11-27 LAB — URINALYSIS, ROUTINE W REFLEX MICROSCOPIC
Bilirubin Urine: NEGATIVE
Glucose, UA: NEGATIVE mg/dL
Hgb urine dipstick: NEGATIVE
Ketones, ur: NEGATIVE mg/dL
Nitrite: NEGATIVE
Protein, ur: NEGATIVE mg/dL
Specific Gravity, Urine: >1.046 — ABNORMAL HIGH (ref 1.005–1.030)
pH: 5 (ref 5.0–8.0)

## 2021-11-27 LAB — CBC WITH DIFFERENTIAL/PLATELET
Abs Immature Granulocytes: 0.01 10*3/uL (ref 0.00–0.07)
Basophils Absolute: 0 10*3/uL (ref 0.0–0.1)
Basophils Relative: 0 %
Eosinophils Absolute: 0.1 10*3/uL (ref 0.0–0.5)
Eosinophils Relative: 1 %
HCT: 40.8 % (ref 36.0–46.0)
Hemoglobin: 13.4 g/dL (ref 12.0–15.0)
Immature Granulocytes: 0 %
Lymphocytes Relative: 26 %
Lymphs Abs: 1.5 10*3/uL (ref 0.7–4.0)
MCH: 30 pg (ref 26.0–34.0)
MCHC: 32.8 g/dL (ref 30.0–36.0)
MCV: 91.5 fL (ref 80.0–100.0)
Monocytes Absolute: 0.5 10*3/uL (ref 0.1–1.0)
Monocytes Relative: 9 %
Neutro Abs: 3.9 10*3/uL (ref 1.7–7.7)
Neutrophils Relative %: 64 %
Platelets: 268 10*3/uL (ref 150–400)
RBC: 4.46 MIL/uL (ref 3.87–5.11)
RDW: 13.6 % (ref 11.5–15.5)
WBC: 6 10*3/uL (ref 4.0–10.5)
nRBC: 0 % (ref 0.0–0.2)

## 2021-11-27 LAB — LIPASE, BLOOD: Lipase: 28 U/L (ref 11–51)

## 2021-11-27 LAB — MAGNESIUM: Magnesium: 2 mg/dL (ref 1.7–2.4)

## 2021-11-27 LAB — LACTIC ACID, PLASMA: Lactic Acid, Venous: 0.8 mmol/L (ref 0.5–1.9)

## 2021-11-27 LAB — PROTIME-INR
INR: 1.5 — ABNORMAL HIGH (ref 0.8–1.2)
Prothrombin Time: 17.6 seconds — ABNORMAL HIGH (ref 11.4–15.2)

## 2021-11-27 LAB — APTT: aPTT: 30 seconds (ref 24–36)

## 2021-11-27 MED ORDER — IOHEXOL 350 MG/ML SOLN
75.0000 mL | Freq: Once | INTRAVENOUS | Status: AC | PRN
  Administered 2021-11-27: 75 mL via INTRAVENOUS

## 2021-11-27 MED ORDER — PANTOPRAZOLE SODIUM 40 MG IV SOLR: 40.0000 mg | INTRAVENOUS | Status: DC

## 2021-11-27 MED ORDER — DICLOFENAC SODIUM 1 % EX GEL
2.0000 g | Freq: Four times a day (QID) | CUTANEOUS | Status: DC | PRN
  Filled 2021-11-27: qty 100

## 2021-11-27 MED ORDER — PROCHLORPERAZINE EDISYLATE 10 MG/2ML IJ SOLN
10.0000 mg | Freq: Four times a day (QID) | INTRAMUSCULAR | Status: DC | PRN
  Administered 2021-11-27 – 2021-11-29 (×3): 10 mg via INTRAVENOUS
  Filled 2021-11-27 (×4): qty 2

## 2021-11-27 MED ORDER — UMECLIDINIUM BROMIDE 62.5 MCG/ACT IN AEPB
1.0000 | INHALATION_SPRAY | Freq: Every day | RESPIRATORY_TRACT | Status: DC
  Administered 2021-11-28 – 2021-12-02 (×5): 1 via RESPIRATORY_TRACT
  Filled 2021-11-27: qty 7

## 2021-11-27 MED ORDER — HYDROMORPHONE HCL 1 MG/ML IJ SOLN
1.0000 mg | INTRAMUSCULAR | Status: DC | PRN
  Administered 2021-11-29: 1 mg via INTRAVENOUS
  Filled 2021-11-27: qty 1

## 2021-11-27 MED ORDER — HYDROMORPHONE HCL 1 MG/ML IJ SOLN: 0.5000 mg | INTRAMUSCULAR | Status: DC | PRN

## 2021-11-27 MED ORDER — LACTATED RINGERS IV SOLN: INTRAVENOUS | Status: DC

## 2021-11-27 MED ORDER — DIPHENHYDRAMINE HCL 50 MG/ML IJ SOLN
25.0000 mg | Freq: Three times a day (TID) | INTRAMUSCULAR | Status: DC | PRN
  Filled 2021-11-27: qty 1

## 2021-11-27 MED ORDER — DIATRIZOATE MEGLUMINE & SODIUM 66-10 % PO SOLN
90.0000 mL | Freq: Once | ORAL | Status: AC
  Administered 2021-11-27: 90 mL via NASOGASTRIC
  Filled 2021-11-27: qty 90

## 2021-11-27 MED ORDER — MORPHINE SULFATE (PF) 4 MG/ML IV SOLN
4.0000 mg | INTRAVENOUS | Status: DC | PRN
  Administered 2021-11-27: 4 mg via INTRAVENOUS
  Filled 2021-11-27: qty 1

## 2021-11-27 MED ORDER — IPRATROPIUM BROMIDE 0.06 % NA SOLN
2.0000 | Freq: Four times a day (QID) | NASAL | Status: DC
  Administered 2021-11-28 – 2021-12-01 (×9): 2 via NASAL
  Filled 2021-11-27: qty 15

## 2021-11-27 MED ORDER — ENOXAPARIN SODIUM 40 MG/0.4ML IJ SOSY
40.0000 mg | PREFILLED_SYRINGE | INTRAMUSCULAR | Status: DC
  Administered 2021-11-28 – 2021-11-30 (×3): 40 mg via SUBCUTANEOUS
  Filled 2021-11-27 (×3): qty 0.4

## 2021-11-27 MED ORDER — FLUTICASONE FUROATE-VILANTEROL 200-25 MCG/ACT IN AEPB
1.0000 | INHALATION_SPRAY | Freq: Every day | RESPIRATORY_TRACT | Status: DC
  Administered 2021-11-28 – 2021-12-02 (×5): 1 via RESPIRATORY_TRACT
  Filled 2021-11-27: qty 28

## 2021-11-27 MED ORDER — ALBUTEROL SULFATE (2.5 MG/3ML) 0.083% IN NEBU: 3.0000 mL | INHALATION_SOLUTION | Freq: Four times a day (QID) | RESPIRATORY_TRACT | Status: DC | PRN

## 2021-11-27 NOTE — H&P (Addendum)
Date: 11/27/2021               Patient Name:  [name] MRN: [id number]  DOB: [birth year] Age / Sex: 61 y.o., female   PCP: Pcp, No         Medical Service: Internal Medicine Teaching Service         Attending Physician: Dr. Reymundo Poll, MD    First Contact: Dr. Cliffton Asters Pager: [phone]  Second Contact: Dr. Montez Morita Pager: [phone]       After Hours (After 5p/  First Contact Pager: [phone]  weekends / holidays): Second Contact Pager: [phone]   Chief Complaint: abdominal pain, emesis  History of Present Illness:  [name] is a 61 year old female living with COPD/asthma, HTN, GERD, chronic low back pain 2/2 OA, history of SBO x6 (dating back to 2016), history of abdominal hysterectomy with complications (see below) who is presenting with LLQ abdominal pain and emesis.  Reports stomach pain that began about 2-3 days ago while on her trip to [place]. Was located in the left lower stomach and did not radiate anywhere. She was able to bear this pain and was tolerating PO intake with regular bowel movements up until yesterday afternoon. Burgess Estelle, she was on her way back from [place] via bus transportation and became hungry. Around 5pm, she consumed a hamburger. Thereafter, she noticed worsening of her LLQ pain. She became nauseous and when she got to the bathroom on the bus, she began having liquid stools along with vomiting at the same time. She reports 4 episodes of liquid stools yesterday without any hematochezia or melena noted. She also reports 3-4 episodes of NBNB emesis during this time as well. She notes that she has been regularly passing flatus, last instance was about half an hour prior to my encounter. Since she began having worsening LLQ pain, diarrhea, and emesis, she has not been able to tolerate any food intake. She has been okay with drinking small sips of water. She denies any headaches, fevers, chills, chest pain, palpitations, SHOB, cough, constipation, urinary  changes, weakness.   Of note, patient has a history of multiple prior abdominal surgeries including an abdominal hysterectomy in 2003 complicated by small bowel prolapse through vagina requiring an ex-lap with partial small bowel resection. She also has a history of SBO in 2002 requiring ex-lap with small bowel resection x2 complicated by abdominal compartment syndrome requiring laparotomy. Lastly, patient has history of laparoscopic ventral hernia repair in 2012 at [facility] in [place].  IMTS asked to admit patient. Surgery consulted by EDP for evaluation.  Meds:  No current facility-administered medications on file prior to encounter.   Current Outpatient Medications on File Prior to Encounter  Medication Sig Dispense Refill   albuterol (PROVENTIL HFA;VENTOLIN HFA) 108 (90 BASE) MCG/ACT inhaler Inhale 1 puff into the lungs every 6 (six) hours as needed for wheezing or shortness of breath (wheezing).      amLODipine (NORVASC) 10 MG tablet Take 10 mg by mouth daily.     bisacodyl (DULCOLAX) 10 MG suppository Place 1 suppository (10 mg total) rectally daily as needed for moderate constipation. (Patient not taking: Reported on 02/27/2016) 12 suppository 0   diclofenac sodium (VOLTAREN) 1 % GEL Apply 2 g topically 4 (four) times daily as needed (knee pain).      diphenhydrAMINE (BENADRYL) 25 MG tablet Take 25 mg by mouth every 6 (six) hours as needed for allergies (allergies).     esomeprazole (NEXIUM) 40 MG capsule  Take 40 mg by mouth daily at 12 noon.     Fluticasone-Salmeterol (ADVAIR) 500-50 MCG/DOSE AEPB Inhale 1 puff into the lungs 2 (two) times daily.     gabapentin (NEURONTIN) 300 MG capsule Take 300 mg by mouth 3 (three) times daily.     ipratropium (ATROVENT) 0.06 % nasal spray Place 2 sprays into the nose 4 (four) times daily. (Patient not taking: Reported on 02/27/2016) 15 mL 12   lisinopril (PRINIVIL,ZESTRIL) 20 MG tablet Take 20 mg by mouth daily.     loratadine (CLARITIN) 10 MG tablet  Take 10 mg by mouth daily.     LORazepam (ATIVAN) 0.5 MG tablet Take 0.5 mg by mouth every 6 (six) hours as needed for anxiety (anxiety).      meloxicam (MOBIC) 15 MG tablet Take 15 mg by mouth daily as needed for pain.   3   methocarbamol (ROBAXIN) 500 MG tablet Take 1 tablet (500 mg total) by mouth 2 (two) times daily as needed (back pain). (Patient not taking: Reported on 02/27/2016) 20 tablet 0   mometasone (NASONEX) 50 MCG/ACT nasal spray USE 1 PUFF IN THE NOSTRILS TWICE A DAY AS NEEDED FOR CONGESTION  0   Multiple Vitamin (MULTIVITAMIN WITH MINERALS) TABS tablet Take 1 tablet by mouth daily.     polyethylene glycol (MIRALAX / GLYCOLAX) packet Take 17 g by mouth daily as needed for mild constipation or moderate constipation (constipation).      promethazine (PHENERGAN) 25 MG tablet Take 25 mg by mouth every 8 (eight) hours as needed for nausea or vomiting (nausea).      rosuvastatin (CRESTOR) 10 MG tablet Take 10 mg by mouth daily.     senna (SENOKOT) 8.6 MG TABS tablet Take 1 tablet (8.6 mg total) by mouth daily. 120 each 0   sertraline (ZOLOFT) 100 MG tablet Take 100 mg by mouth daily.     tiotropium (SPIRIVA) 18 MCG inhalation capsule Place 18 mcg into inhaler and inhale daily.     traMADol (ULTRAM) 50 MG tablet Take 1 tablet (50 mg total) by mouth every 6 (six) hours as needed. 30 tablet 0   traZODone (DESYREL) 100 MG tablet Take 100 mg by mouth at bedtime as needed for sleep.   1   triamterene-hydrochlorothiazide (MAXZIDE-25) 37.5-25 MG tablet Take 1 tablet by mouth daily.  3    Allergies: Allergies as of 11/27/2021 - Review Complete 11/27/2021  Allergen Reaction Noted   Bee venom Anaphylaxis and Itching 07/30/2014   Peanuts [peanut oil] Anaphylaxis and Itching 05/05/2013   Shellfish allergy Anaphylaxis and Itching 05/05/2013   Aspirin Itching 01/30/2014   Corn-containing products Itching 05/05/2013   Ibuprofen Itching 01/30/2014   Latex Itching 01/30/2014   Morphine and related  Itching 01/30/2014   Zofran [ondansetron hcl] Itching and Rash 03/22/2014   Past Medical History:  Diagnosis Date   Asthma    Cataract    Chronic bronchitis (HCC)    Chronic lower back pain    COPD (chronic obstructive pulmonary disease) (HCC)    Depression    GERD (gastroesophageal reflux disease)    Heart murmur    History of blood transfusion 2003-2004   "related to bowel obstruction OR"   Hyperlipidemia    Hypertension    Insomnia    Osteoarthritis    "lower back; both hips; right leg" (08/01/2014)   SBO (small bowel obstruction) ([place]) 10/2015    Family History:  Family History  Problem Relation Age of Onset   Cancer Mother  Heart attack Father    Colon cancer Maternal Grandmother    Colon cancer Paternal Grandmother    Social History:  -typically independent in ADLs and IADLs -no history of tobacco use, alcohol use, or recreational drug use -recently moved back to [place], no PCP here  Review of Systems: A complete ROS was negative except as per HPI.   Physical Exam: Blood pressure 139/71, pulse 88, temperature 98.7 F (37.1 C), temperature source Oral, resp. rate 12, SpO2 100 %. Physical Exam Constitutional:      Appearance: She is obese. She is not ill-appearing.     Comments: Pleasant middle-aged female, laying in bed, NAD.  HENT:     Mouth/Throat:     Pharynx: Oropharynx is clear.     Comments: Dry mucus membranes Eyes:     Extraocular Movements: Extraocular movements intact.     Pupils: Pupils are equal, round, and reactive to light.  Cardiovascular:     Rate and Rhythm: Normal rate and regular rhythm.     Heart sounds: Murmur heard.     No gallop.  Pulmonary:     Effort: Pulmonary effort is normal.     Breath sounds: Normal breath sounds.  Abdominal:     General: Abdomen is flat and protuberant. There is distension.     Palpations: Abdomen is soft.     Hernia: No hernia is present.     Comments: TTP in LLQ. Well-healed midline surgical scar.  Hypoactive bowel sounds.  Skin:    General: Skin is warm and dry.  Neurological:     General: No focal deficit present.     Mental Status: She is alert and oriented to person, place, and time.     Comments: AAOx3  Psychiatric:        Mood and Affect: Mood normal.        Behavior: Behavior normal.     CT ABDOMEN PELVIS W CONTRAST Result Date: 11/27/2021 IMPRESSION:  1. Acute high-grade partial small-bowel obstruction versus early complete small bowel obstruction within the LEFT abdomen, with transition point likely in the LEFT lower quadrant given the relatively decompressed small bowel in the LEFT lower quadrant. No obstructing mass is seen, possibly adhesions. Chronically distended small bowel within the RIGHT upper abdomen, although at least mildly increased compared to previous exams due to the superimposed acute obstruction in the LEFT abdomen.  2. Clustered small and moderate-sized lymph nodes within the central abdominal mesentery, likely reactive in nature.  3. **An incidental finding of potential clinical significance has been found. Tubular masslike density within the lateral aspects of the RIGHT lower lobe, measuring 1.4 cm, most likely mucous plugging, alternatively incidental pulmonary pseudoaneurysm, much less likely neoplastic mass. Given that it is much less likely to be a neoplastic mass, merely recommend follow-up chest CT angiogram in 3 months for further characterization and to ensure stability. **  4. Fatty infiltration of the liver.  5. Cholelithiasis without evidence of acute cholecystitis.  6. 4 mm nonobstructing RIGHT renal stone.  Electronically Signed   By: Bary Richard M.D.   On: 11/27/2021 09:31     Assessment & Plan by Problem: Principal Problem:   Small bowel obstruction (HCC) Active Problems:   COPD (chronic obstructive pulmonary disease) (HCC)   Hypertension   GERD (gastroesophageal reflux disease)   Osteoarthritis   Asthma   Chronic low back  pain  Mechanical SBO likely 2/2 adhesions Hx of SBO Hx of multiple prior abdominal surgeries requiring partial small bowel resection  Patient presented with abdominal pain and emesis, found to have a SBO on CT imaging with a transition point likely in the LLQ and with likely reactive lymphadenopathy in that area. She has a history of multiple abdominal surgeries, some of which required partial SB resections, and has a history of multiple SBOs dating back to 2002. She does report multiple liquid stools yesterday, which are likely indicative of overflow diarrhea. Fortunately, this appears more consistent with a partial SBO given that she is passing flatus regularly. Low concern for bowel compromise given lack of fevers, tachycardia, leukocytosis, or signs or peritonitis on exam. Surgery is following, recommending NGT decompression and SBO protocol. No indication for acute surgical intervention at this time. -appreciate surgery recommendations -NPO -NGT decompression -SBO protocol -LR @100cc /hr for maintenance -dilaudid 0.5mg  q4 prn for severe pain, dilaudid 1mg  q4 prn for breakthrough pain (can transition to PO pain management once diet is started) -IV compazine prn for N/V -lovenox for dvt ppx -trend CBC, checking coags  Incidental finding of tubular mass-like density in RLL 1.4cm tubular mass-like density in the RLL, most likely mucus plugging vs pulmonary pseudoaneurysm. Low concern for neoplastic mass. Per radiology, recommend f/u with CT chest angiogram in 3 months for further characterization and to ensure stability. -CT chest angiogram in 3 months as outpatient  Chronic low back pain 2/2 OA Long-standing low back pain 2/2 osteoarthritis. She manages this with tylenol at home. Pain control as above for now since she is NPO, but can resume prn tylenol once she is able to tolerate PO.  COPD Asthma Doing well from a respiratory perspective at this time. Will resume home  medications. -continue albuterol inhaler q6 prn -breo ellipta daily -ipratropium qid -spiriva daily  GERD -IV protonix 40mg  daily while NPO, then transition to home PO omeprazole 40mg  daily.  HTN On norvasc 10mg  daily, lisinopril 20mg  daily, and triamterene-HCTZ 37.5-25mg  daily at home. Holding at this time given she is NPO with plans for NGT placement. Blood pressures 130s-140s since arrival. -holding home antihypertensives, can resume once tolerating PO intake  Depression Anxiety On zoloft 100mg  daily and trazodone 100mg  qhs prn for sleep. -holding while NPO, can resume once able  Dispo: Admit patient to Observation with expected length of stay less than 2 midnights.  Signed: , MD 11/27/2021, 1:22 PM  Pager: [phone] After 5pm on weekdays and 1pm on weekends: On Call pager: [phone]

## 2021-11-27 NOTE — Hospital Course (Addendum)
  Abd distention decreased  Passing flatus, No N/V   Nl Bowel sounds,   10/9: Pt still having some nausea, has not had food today   Takes Phenergen at home before food. States nausea is a lot better than when she came in. Just nausea, no vomiting.   Abdominal pain Is better, Dr. Loann Quill? Has not seen him in a year. Is compliant with her medications. BP's with her medications she is not able to tell. Has an appointment with him on Wednesday or Thursday of this week. Will start Norvasc back.

## 2021-11-27 NOTE — ED Notes (Signed)
Secure chat sent to attending regarding NG refusal

## 2021-11-27 NOTE — Consult Note (Signed)
[name] 07/24/1960  [id number].    Requesting MD: Jearld Fenton, MD Chief Complaint/Reason for Consult: SBO  HPI:  [name] is a 61 y/o F who presents with a cc abdominal pain. Reports mild abdominal discomfort for a couple of days but it got acutely worse yesterday evening after she ate a sandwich. Described as sharp,  constant, LLQ pain. Denies aggravating or alleviating factors.  States pain feels similar to when she had an SBO in 2017. Associated with multiple episodes of nausea, non-bloody emesis, as well as diarrhea. Also reports urinary hesitancy. She denies fever, chills, chest pain, SOB. Her medical history is significant for COPD, GERD, Depression, HTN, and HLD.  Multiple past abdominal surgeries: abdominal hysterectomy 2003 resulting in small bowel prolapse through vagina resulting in ex lap, SBR. SBO in 2002 >> ex lap SBR x2, abdominal compartment syndrome >> laparotomy, hx EC fistula - now healed, hx laparoscopic ventral hernia repair 2012 at [facility] in [place]  Denies tobacco, alcohol, or drug use.  Currently lives in [place] with her daughter. Denies use of blood thinners   ROS: As above Review of Systems  All other systems reviewed and are negative.   Family History  Problem Relation Age of Onset   Cancer Mother    Heart attack Father    Colon cancer Maternal Grandmother    Colon cancer Paternal Grandmother     Past Medical History:  Diagnosis Date   Asthma    Cataract    Chronic bronchitis (HCC)    Chronic lower back pain    COPD (chronic obstructive pulmonary disease) (HCC)    Depression    GERD (gastroesophageal reflux disease)    Heart murmur    History of blood transfusion 2003-2004   "related to bowel obstruction OR"   Hyperlipidemia    Hypertension    Insomnia    Osteoarthritis    "lower back; both hips; right leg" (08/01/2014)   SBO (small bowel obstruction) (HCC) 10/2015    Past Surgical History:  Procedure Laterality Date    ABDOMINAL EXPLORATION SURGERY  2004   abdominal compartment syndrome   ABDOMINAL EXPLORATION SURGERY  2003   small bowel prolapse through vagina   ABDOMINAL HYSTERECTOMY  2003   ADENOIDECTOMY  2002   BOWEL RESECTION  2003; 2004   x2 with primary anastamosis   COMBINED HYSTEROSCOPY DIAGNOSTIC / D&C  ~ 2003   HERNIA REPAIR     JOINT REPLACEMENT     LAPAROSCOPIC INCISIONAL / UMBILICAL / VENTRAL HERNIA REPAIR  2012   [facility], Dr. Carolynn Sayers   TONSILLECTOMY  1977   TOTAL KNEE ARTHROPLASTY Right 03/2012   [facility]    Social History:  reports that she has never smoked. She has never used smokeless tobacco. She reports that she does not drink alcohol and does not use drugs.  Allergies:  Allergies  Allergen Reactions   Bee Venom Anaphylaxis and Itching   Peanuts [Peanut Oil] Anaphylaxis and Itching   Shellfish Allergy Anaphylaxis and Itching   Aspirin Itching   Corn-Containing Products Itching   Ibuprofen Itching   Latex Itching   Morphine And Related Itching   Zofran [Ondansetron Hcl] Itching and Rash    (Not in a hospital admission)    Physical Exam: Blood pressure 139/71, pulse 88, temperature 98.7 F (37.1 C), temperature source Oral, resp. rate 12, SpO2 100 %. General: Pleasant female laying on hospital bed, appears stated age, NAD. HEENT: head -normocephalic, atraumatic; Eyes: PERRLA, no conjunctival  injection Neck- Trachea is midline, no thyromegaly CV- RRR, radial pulses 2+ BL, no lower extremity edema Pulm- breathing is non-labored ORA  Abd- soft, moderate upper abdominal distention and tympany, mild TTP LLQ without guarding or peritonitis. Previous laparoscopic and laparotomy scars appear well healing.   GU- deferred  MSK- UE/LE symmetrical, no cyanosis, clubbing, or edema. Neuro- non focal exam, gait not assessed.  Psych- Alert and Oriented x3 with appropriate affect Skin: warm and dry, no rashes or lesions   Results for orders placed or performed during the hospital  encounter of 11/27/21 (from the past 48 hour(s))  CBC with Differential     Status: None   Collection Time: 11/27/21  4:46 AM  Result Value Ref Range   WBC 6.0 4.0 - 10.5 K/uL   RBC 4.46 3.87 - 5.11 MIL/uL   Hemoglobin 13.4 12.0 - 15.0 g/dL   HCT 86.7 67.2 - 09.4 %   MCV 91.5 80.0 - 100.0 fL   MCH 30.0 26.0 - 34.0 pg   MCHC 32.8 30.0 - 36.0 g/dL   RDW 70.9 62.8 - 36.6 %   Platelets 268 150 - 400 K/uL   nRBC 0.0 0.0 - 0.2 %   Neutrophils Relative % 64 %   Neutro Abs 3.9 1.7 - 7.7 K/uL   Lymphocytes Relative 26 %   Lymphs Abs 1.5 0.7 - 4.0 K/uL   Monocytes Relative 9 %   Monocytes Absolute 0.5 0.1 - 1.0 K/uL   Eosinophils Relative 1 %   Eosinophils Absolute 0.1 0.0 - 0.5 K/uL   Basophils Relative 0 %   Basophils Absolute 0.0 0.0 - 0.1 K/uL   Immature Granulocytes 0 %   Abs Immature Granulocytes 0.01 0.00 - 0.07 K/uL    Comment: Performed at [facility], [address] [place], [place] [address]  Comprehensive metabolic panel     Status: Abnormal   Collection Time: 11/27/21  4:46 AM  Result Value Ref Range   Sodium 139 135 - 145 mmol/L   Potassium 3.8 3.5 - 5.1 mmol/L   Chloride 103 98 - 111 mmol/L   CO2 25 22 - 32 mmol/L   Glucose, Bld 105 (H) 70 - 99 mg/dL    Comment: Glucose reference range applies only to samples taken after fasting for at least 8 hours.   BUN 14 6 - 20 mg/dL   Creatinine, Ser 5.46 0.44 - 1.00 mg/dL   Calcium 50.3 8.9 - 54.6 mg/dL   Total Protein 6.8 6.5 - 8.1 g/dL   Albumin 4.1 3.5 - 5.0 g/dL   AST 33 15 - 41 U/L   ALT 25 0 - 44 U/L   Alkaline Phosphatase 77 38 - 126 U/L   Total Bilirubin 0.7 0.3 - 1.2 mg/dL   GFR, Estimated >56 >81 mL/min    Comment: (NOTE) Calculated using the CKD-EPI Creatinine Equation (2021)    Anion gap 11 5 - 15    Comment: Performed at [facility], [address] [place], [place] [address]  Lipase, blood     Status: None   Collection Time: 11/27/21  4:46 AM  Result Value Ref Range   Lipase 28 11 - 51 U/L     Comment: Performed at [facility], [address] [place], [place] [address]   CT ABDOMEN PELVIS W CONTRAST  Result Date: 11/27/2021 CLINICAL DATA:  Bowel obstruction suspected. Abdominal pain, bloating, nausea and vomiting. History of small-bowel obstruction. EXAM: CT ABDOMEN AND PELVIS  WITH CONTRAST TECHNIQUE: Multidetector CT imaging of the abdomen and pelvis was performed using the standard protocol following bolus administration of intravenous contrast. RADIATION DOSE REDUCTION: This exam was performed according to the departmental dose-optimization program which includes automated exposure control, adjustment of the mA and/or kV according to patient size and/or use of iterative reconstruction technique. CONTRAST:  72mL OMNIPAQUE IOHEXOL 350 MG/ML SOLN COMPARISON:  CT abdomen and pelvis dated 02/27/2016 and 08/14/2015. FINDINGS: Lower chest: Tubular masslike density within the lateral aspects of the RIGHT lower lobe, measuring 1.4 cm (series 4, image 9). Lung bases otherwise clear. Hepatobiliary: Liver is diffusely low in density suggesting fatty infiltration. Multiple stones within the nondistended gallbladder. No bile duct dilatation seen. Pancreas: Unremarkable. No pancreatic ductal dilatation or surrounding inflammatory changes. Spleen: Normal in size without focal abnormality. Adrenals/Urinary Tract: Adrenal glands appear normal. 4 mm nonobstructing RIGHT renal stone. Kidneys otherwise unremarkable without suspicious mass or hydronephrosis. Bladder appears normal. Stomach/Bowel: Chronically distended small bowel within the RIGHT upper abdomen, although at least mildly increased compared to previous exams. However, newly dilated small bowel loops within the LEFT abdomen with transition point in the LEFT lower quadrant, with associated air-fluid levels, consistent with acute small bowel obstruction. Relatively decreased small bowel loops are seen within the LEFT lower quadrant to pelvis, but  air is seen throughout the normal-caliber colon, consistent with early complete small bowel obstruction or high-grade partial small bowel obstruction. Stomach is also distended with fluid. Thickening of the walls of the small bowel loops upper quadrant is likely reactive, possibly contributory. Vascular/Lymphatic: No acute-appearing vascular abnormality is seen. No abdominal aortic aneurysm. Clustered small and moderate-sized lymph nodes within the central abdominal mesentery, likely reactive in nature. Reproductive: Presumed hysterectomy.  No adnexal mass or free fluid. Other: No free fluid or abscess collection is seen. No free intraperitoneal air. No evidence of portal venous gas or pneumatosis intestinalis. Musculoskeletal: No acute or suspicious osseous abnormality. IMPRESSION: 1. Acute high-grade partial small-bowel obstruction versus early complete small bowel obstruction within the LEFT abdomen, with transition point likely in the LEFT lower quadrant given the relatively decompressed small bowel in the LEFT lower quadrant. No obstructing mass is seen, possibly adhesions. Chronically distended small bowel within the RIGHT upper abdomen, although at least mildly increased compared to previous exams due to the superimposed acute obstruction in the LEFT abdomen. 2. Clustered small and moderate-sized lymph nodes within the central abdominal mesentery, likely reactive in nature. 3. **An incidental finding of potential clinical significance has been found. Tubular masslike density within the lateral aspects of the RIGHT lower lobe, measuring 1.4 cm, most likely mucous plugging, alternatively incidental pulmonary pseudoaneurysm, much less likely neoplastic mass. Given that it is much less likely to be a neoplastic mass, merely recommend follow-up chest CT angiogram in 3 months for further characterization and to ensure stability. ** 4. Fatty infiltration of the liver. 5. Cholelithiasis without evidence of acute  cholecystitis. 6. 4 mm nonobstructing RIGHT renal stone. Electronically Signed   By: Bary Richard M.D.   On: 11/27/2021 09:31      Assessment/Plan SBO - CT today w/ dilated loops of bowel in the RUQ appear chronic, but increased compared to scan in 2018. New dilated loops in LLQ with transition zone.  - AFVSS, WBC 6.0, benign exam, no emergent indication for surgery at this time - recommend NG decompression, SBO protocol - CCS will follow    FEN - NPO, NG to LIWS VTE - SCD's, ok for chemical VTE ID -  none indicated Admit - to medical service   HTN HLD GERD COPD Depression  I reviewed nursing notes, ED provider notes, last 24 h vitals and pain scores, last 48 h intake and output, last 24 h labs and trends, and last 24 h imaging results.  [facility] 11/27/2021, 11:12 AM Please see Amion for pager number during day hours 7:00am-4:30pm or 7:00am -11:30am on weekends

## 2021-11-27 NOTE — ED Provider Notes (Addendum)
[facility] Provider Note   CSN: [id number] Arrival date & time: 11/27/21  0430     History  Chief Complaint  Patient presents with   Abdominal Pain   Emesis    [name] is a 61 y.o. female with COPD/asthma,, hypertension, GERD, hyperlipidemia, history SBO, presents with nausea vomiting, abdominal pain.  Patient is brought in by EMS due to nausea vomiting, NBNB.  Has not been able to hold anything down since 5 PM yesterday.  Denies any fever/chills, chest pain, shortness of breath, constipation, hematochezia/melena.  Has a history of an SBO and reports that this feels similar to that. Last BM was yesterday, was watery diarrhea. Not passing gas today. Also notes urinary retention that started yesterday, having trouble urinating. No h/o similar. Has had no dysuria/hematuria.    Abdominal Pain Associated symptoms: vomiting   Emesis Associated symptoms: abdominal pain        Home Medications Prior to Admission medications   Medication Sig Start Date End Date Taking? Authorizing Provider  albuterol (PROVENTIL HFA;VENTOLIN HFA) 108 (90 BASE) MCG/ACT inhaler Inhale 1 puff into the lungs every 6 (six) hours as needed for wheezing or shortness of breath (wheezing).     [provider]  amLODipine (NORVASC) 10 MG tablet Take 10 mg by mouth daily.    [provider]  bisacodyl (DULCOLAX) 10 MG suppository Place 1 suppository (10 mg total) rectally daily as needed for moderate constipation. Patient not taking: Reported on 02/27/2016 08/03/14   Reyne Dumas, MD  diclofenac sodium (VOLTAREN) 1 % GEL Apply 2 g topically 4 (four) times daily as needed (knee pain).     [provider]  diphenhydrAMINE (BENADRYL) 25 MG tablet Take 25 mg by mouth every 6 (six) hours as needed for allergies (allergies).    [provider]  esomeprazole (NEXIUM) 40 MG capsule Take 40 mg by mouth daily at 12 noon.    [provider]  Fluticasone-Salmeterol (ADVAIR) 500-50 MCG/DOSE AEPB Inhale 1 puff into the lungs 2 (two) times daily.    [provider]  gabapentin (NEURONTIN) 300 MG capsule Take 300 mg by mouth 3 (three) times daily.    [provider]  ipratropium (ATROVENT) 0.06 % nasal spray Place 2 sprays into the nose 4 (four) times daily. Patient not taking: Reported on 02/27/2016 05/05/13   Billy Fischer, MD  lisinopril (PRINIVIL,ZESTRIL) 20 MG tablet Take 20 mg by mouth daily.    [provider]  loratadine (CLARITIN) 10 MG tablet Take 10 mg by mouth daily.    [provider]  LORazepam (ATIVAN) 0.5 MG tablet Take 0.5 mg by mouth every 6 (six) hours as needed for anxiety (anxiety).     [provider]  meloxicam (MOBIC) 15 MG tablet Take 15 mg by mouth daily as needed for pain.  09/18/14   [provider]  methocarbamol (ROBAXIN) 500 MG tablet Take 1 tablet (500 mg total) by mouth 2 (two) times daily as needed (back pain). Patient not taking: Reported on 02/27/2016 03/04/15   Tomi Likens, PA-C  mometasone (NASONEX) 50 MCG/ACT nasal spray USE 1 PUFF IN THE NOSTRILS TWICE A DAY AS NEEDED FOR CONGESTION 07/14/14   [provider]  Multiple Vitamin (MULTIVITAMIN WITH MINERALS) TABS tablet Take 1 tablet by mouth daily.    [provider]  polyethylene glycol (MIRALAX / GLYCOLAX) packet Take 17 g by mouth daily as needed for mild constipation or moderate constipation (  constipation).     [provider]  promethazine (PHENERGAN) 25 MG tablet Take 25 mg by mouth every 8 (eight) hours as needed for nausea or vomiting (nausea).  08/20/13   [provider]  rosuvastatin (CRESTOR) 10 MG tablet Take 10 mg by mouth daily.    [provider]  senna (SENOKOT) 8.6 MG TABS tablet Take 1 tablet (8.6 mg total) by mouth daily. 11/22/15   Reyne Dumas, MD  sertraline (ZOLOFT) 100 MG tablet Take 100 mg by mouth daily.    [provider]  tiotropium (SPIRIVA) 18 MCG inhalation capsule Place 18 mcg into inhaler and inhale daily.    [provider]  traMADol (ULTRAM) 50 MG tablet Take 1 tablet (50 mg total) by mouth every 6 (six) hours as needed. 06/02/15   Dowless, Aldona Bar Tripp, PA-C  traZODone (DESYREL) 100 MG tablet Take 100 mg by mouth at bedtime as needed for sleep.  06/09/14   [provider]  triamterene-hydrochlorothiazide (MAXZIDE-25) 37.5-25 MG tablet Take 1 tablet by mouth daily. 11/06/14   [provider]      Allergies    Bee venom, Peanuts [peanut oil], Shellfish allergy, Aspirin, Corn-containing products, Ibuprofen, Latex, Morphine and related, and Zofran [ondansetron hcl]    Review of Systems   Review of Systems  Gastrointestinal:  Positive for abdominal pain and vomiting.   Review of systems negative for f/c.  A 10 point review of systems was performed and is negative unless otherwise reported in HPI.  Physical Exam Updated Vital Signs BP (!) 142/76   Pulse 79   Temp 98.7 F (37.1 C) (Oral)   Resp 12   SpO2 100%  Physical Exam General: Normal appearing female, lying in bed.  HEENT: PERRLA, Sclera anicteric, MMM, trachea midline. Cardiology: RRR, no murmurs/rubs/gallops. BL radial and DP pulses equal bilaterally.  Resp: Normal respiratory rate and effort. CTAB, no wheezes, rhonchi, crackles.  Abd: Mildly distended, diffusely tender, especially epigastric and LLQ tenderness. No rebound tenderness or guarding.  GU: Deferred. MSK: No peripheral edema or signs of trauma. Extremities without deformity or TTP. No cyanosis or clubbing. Skin: warm, dry. No rashes or lesions. Back: No CVA tenderness Neuro: A&Ox4, CNs II-XII grossly intact. MAEs. Sensation grossly intact.  Psych: Normal mood and affect.   ED Results / Procedures / Treatments   Labs (all labs ordered are listed, but only abnormal results are displayed) Labs Reviewed  COMPREHENSIVE METABOLIC PANEL - Abnormal;  Notable for the following components:      Result Value   Glucose, Bld 105 (*)    All other components within normal limits  CBC WITH DIFFERENTIAL/PLATELET  LIPASE, BLOOD  URINALYSIS, ROUTINE W REFLEX MICROSCOPIC  LACTIC ACID, PLASMA  LACTIC ACID, PLASMA    Radiology CT ABDOMEN PELVIS W CONTRAST  Result Date: 11/27/2021 CLINICAL DATA:  Bowel obstruction suspected. Abdominal pain, bloating, nausea and vomiting. History of small-bowel obstruction. EXAM: CT ABDOMEN AND PELVIS WITH CONTRAST TECHNIQUE: Multidetector CT imaging of the abdomen and pelvis was performed using the standard protocol following bolus administration of intravenous contrast. RADIATION DOSE REDUCTION: This exam was performed according to the departmental dose-optimization program which includes automated exposure control, adjustment of the mA and/or kV according to patient size and/or use of iterative reconstruction technique. CONTRAST:  65mL OMNIPAQUE IOHEXOL 350 MG/ML SOLN COMPARISON:  CT abdomen and pelvis dated 02/27/2016 and 08/14/2015. FINDINGS: Lower chest: Tubular masslike density within the lateral aspects of the RIGHT lower lobe, measuring 1.4 cm (series 4,  image 9). Lung bases otherwise clear. Hepatobiliary: Liver is diffusely low in density suggesting fatty infiltration. Multiple stones within the nondistended gallbladder. No bile duct dilatation seen. Pancreas: Unremarkable. No pancreatic ductal dilatation or surrounding inflammatory changes. Spleen: Normal in size without focal abnormality. Adrenals/Urinary Tract: Adrenal glands appear normal. 4 mm nonobstructing RIGHT renal stone. Kidneys otherwise unremarkable without suspicious mass or hydronephrosis. Bladder appears normal. Stomach/Bowel: Chronically distended small bowel within the RIGHT upper abdomen, although at least mildly increased compared to previous exams. However, newly dilated small bowel loops within the LEFT abdomen with transition point in the LEFT  lower quadrant, with associated air-fluid levels, consistent with acute small bowel obstruction. Relatively decreased small bowel loops are seen within the LEFT lower quadrant to pelvis, but air is seen throughout the normal-caliber colon, consistent with early complete small bowel obstruction or high-grade partial small bowel obstruction. Stomach is also distended with fluid. Thickening of the walls of the small bowel loops upper quadrant is likely reactive, possibly contributory. Vascular/Lymphatic: No acute-appearing vascular abnormality is seen. No abdominal aortic aneurysm. Clustered small and moderate-sized lymph nodes within the central abdominal mesentery, likely reactive in nature. Reproductive: Presumed hysterectomy.  No adnexal mass or free fluid. Other: No free fluid or abscess collection is seen. No free intraperitoneal air. No evidence of portal venous gas or pneumatosis intestinalis. Musculoskeletal: No acute or suspicious osseous abnormality. IMPRESSION: 1. Acute high-grade partial small-bowel obstruction versus early complete small bowel obstruction within the LEFT abdomen, with transition point likely in the LEFT lower quadrant given the relatively decompressed small bowel in the LEFT lower quadrant. No obstructing mass is seen, possibly adhesions. Chronically distended small bowel within the RIGHT upper abdomen, although at least mildly increased compared to previous exams due to the superimposed acute obstruction in the LEFT abdomen. 2. Clustered small and moderate-sized lymph nodes within the central abdominal mesentery, likely reactive in nature. 3. **An incidental finding of potential clinical significance has been found. Tubular masslike density within the lateral aspects of the RIGHT lower lobe, measuring 1.4 cm, most likely mucous plugging, alternatively incidental pulmonary pseudoaneurysm, much less likely neoplastic mass. Given that it is much less likely to be a neoplastic mass, merely  recommend follow-up chest CT angiogram in 3 months for further characterization and to ensure stability. ** 4. Fatty infiltration of the liver. 5. Cholelithiasis without evidence of acute cholecystitis. 6. 4 mm nonobstructing RIGHT renal stone. Electronically Signed   By: Franki Cabot M.D.   On: 11/27/2021 09:31    Procedures Procedures    Medications Ordered in ED Medications  diphenhydrAMINE (BENADRYL) injection 25 mg (has no administration in time range)  prochlorperazine (COMPAZINE) injection 10 mg (has no administration in time range)  morphine (PF) 4 MG/ML injection 4 mg (has no administration in time range)  iohexol (OMNIPAQUE) 350 MG/ML injection 75 mL (75 mLs Intravenous Contrast Given 11/27/21 0909)    ED Course/ Medical Decision Making/ A&P                          Medical Decision Making Amount and/or Complexity of Data Reviewed Labs: ordered. Radiology:  Decision-making details documented in ED Course.  Risk Prescription drug management. Decision regarding hospitalization.   Patient is -appearing, HDS.   For DDX for abdominal pain includes but is not limited to:  Abdominal exam without peritoneal signs. No evidence of acute abdomen at this time.  However, high suspicion for acute small bowel obstruction as patient states  she has a history of this and this feels similar, especially with nausea vomiting, diarrhea/no flatus.  Also consider diverticulitis given left lower quadrant pain, or pancreatitis.  Lower suspicion for acute hepatobiliary disease (including acute cholecystitis or cholangitis),  PUD, acute appendicitis, vascular catastrophe, viscus perforation. Patient with urinary retention as well, consider UTI, obstruction d/t stool, distal renal stone. No other neuro deficits, no trauma, and intact gait witnessed in room, low c/f cauda equina syndrome. Also consider acute dehydration and low urine output given N/V as cause of pseudo-retention. Will bladder scan.  Pain  control with morphine, which patient states she tolerates, and Compazine due to allergy list.  Will give Benadryl as needed for itching.   I have personally reviewed and interpreted all labs and imaging.   Clinical Course as of 11/27/21 1035  Fri Nov 27, 2021  1013 CT ABDOMEN PELVIS W CONTRAST 1. Acute high-grade partial small-bowel obstruction versus early complete small bowel obstruction within the LEFT abdomen, with transition point likely in the LEFT lower quadrant given the relatively decompressed small bowel in the LEFT lower quadrant. No obstructing mass is seen, possibly adhesions. Chronically distended small bowel within the RIGHT upper abdomen, although at least mildly increased compared to previous exams due to the superimposed acute obstruction in the LEFT abdomen. 2. Clustered small and moderate-sized lymph nodes within the central abdominal mesentery, likely reactive in nature. 3. **An incidental finding of potential clinical significance has been found. Tubular masslike density within the lateral aspects of the RIGHT lower lobe, measuring 1.4 cm, most likely mucous plugging, alternatively incidental pulmonary pseudoaneurysm, much less likely neoplastic mass. Given that it is much less likely to be a neoplastic mass, merely recommend follow-up chest CT angiogram in 3 months for further characterization and to ensure stability. ** 4. Fatty infiltration of the liver. 5. Cholelithiasis without evidence of acute cholecystitis. 6. 4 mm nonobstructing RIGHT renal stone. [HN]  1013 Unremarkable CMP, lipase, CBC. [HN]  1021 Patient is informed of the tubular mass in the lung. States she has no h/o of this but has dealt with mucous problems before. Informed of need for repeat CT scan in 3 months. [HN]    Clinical Course User Index [HN] Audley Hose, MD   Patient is consulted to surgery who recommends NG tube and will come to evaluate patient. Patient is refusing NGT. Patient was able to  urinate large volume urine output. UA pending.  Consulted to medicine who will admit the patient for further work-up and management.  DC: Admit    Final Clinical Impression(s) / ED Diagnoses Final diagnoses:  Nausea vomiting and diarrhea  Small bowel obstruction ([place])    Rx / DC Orders ED Discharge Orders     None        This note was created using dictation software, which may contain spelling or grammatical errors.     Audley Hose, MD 11/27/21 [phone]

## 2021-11-27 NOTE — ED Notes (Signed)
Labeled specimen cup

## 2021-11-27 NOTE — ED Notes (Signed)
Pt is refusing an NG tube and is aware of of why she needs it.  She states that she has gone through this before and she needs to just drink milk verbalizes understanding that she in NPO

## 2021-11-27 NOTE — ED Provider Triage Note (Signed)
Emergency Medicine Provider Triage Evaluation Note  CRYSTAL SCARBERRY , a 61 y.o. female  was evaluated in triage.  [place] due to nausea/vomiting.  Has not been able to hold anything down since 5pm yesterday.  No fever or diarrhea.  Hx of SBO and reports this feels similar.  Review of Systems  Positive: Nausea, vomiting Negative: fever  Physical Exam  BP (!) 146/94 (BP Location: Right Arm)   Pulse 94   Temp 98.9 F (37.2 C) (Oral)   Resp 18   SpO2 97%   Gen:   Awake, no distress   Resp:  Normal effort  MSK:   Moves extremities without difficulty  Other:  Abdomen appears somewhat distended.  Medical Decision Making  Medically screening exam initiated at 4:29 AM.  Appropriate orders placed.  Qamar Rosman Stryker was informed that the remainder of the evaluation will be completed by another provider, this initial triage assessment does not replace that evaluation, and the importance of remaining in the ED until their evaluation is complete.  Nausea/vomiting.   Some distention on exam.  Hx of SBO and reports feels similar.  Labs, CT scan ordered.   Larene Pickett, PA-C 11/27/21 [phone]

## 2021-11-27 NOTE — ED Triage Notes (Signed)
Per EMS, [place], states she has not been able to keep anything down since 5pm Thursday.    170/110 98HR  16 RR 98% RA

## 2021-11-27 NOTE — ED Notes (Signed)
Pt stated she cant void. Pt have la

## 2021-11-28 ENCOUNTER — Observation Stay (HOSPITAL_COMMUNITY): Payer: Medicare Other

## 2021-11-28 DIAGNOSIS — K219 Gastro-esophageal reflux disease without esophagitis: Secondary | ICD-10-CM | POA: Diagnosis present

## 2021-11-28 DIAGNOSIS — E785 Hyperlipidemia, unspecified: Secondary | ICD-10-CM | POA: Diagnosis present

## 2021-11-28 DIAGNOSIS — R1310 Dysphagia, unspecified: Secondary | ICD-10-CM | POA: Diagnosis present

## 2021-11-28 DIAGNOSIS — Z91010 Allergy to peanuts: Secondary | ICD-10-CM | POA: Diagnosis not present

## 2021-11-28 DIAGNOSIS — Z885 Allergy status to narcotic agent status: Secondary | ICD-10-CM | POA: Diagnosis not present

## 2021-11-28 DIAGNOSIS — Z91013 Allergy to seafood: Secondary | ICD-10-CM | POA: Diagnosis not present

## 2021-11-28 DIAGNOSIS — Z800 Family history of malignant neoplasm of digestive organs: Secondary | ICD-10-CM | POA: Diagnosis not present

## 2021-11-28 DIAGNOSIS — E669 Obesity, unspecified: Secondary | ICD-10-CM | POA: Diagnosis present

## 2021-11-28 DIAGNOSIS — Z79899 Other long term (current) drug therapy: Secondary | ICD-10-CM | POA: Diagnosis not present

## 2021-11-28 DIAGNOSIS — F32A Depression, unspecified: Secondary | ICD-10-CM | POA: Diagnosis present

## 2021-11-28 DIAGNOSIS — K5651 Intestinal adhesions [bands], with partial obstruction: Secondary | ICD-10-CM | POA: Diagnosis present

## 2021-11-28 DIAGNOSIS — G8929 Other chronic pain: Secondary | ICD-10-CM | POA: Diagnosis present

## 2021-11-28 DIAGNOSIS — J4489 Other specified chronic obstructive pulmonary disease: Secondary | ICD-10-CM | POA: Diagnosis present

## 2021-11-28 DIAGNOSIS — F419 Anxiety disorder, unspecified: Secondary | ICD-10-CM | POA: Diagnosis present

## 2021-11-28 DIAGNOSIS — Z886 Allergy status to analgesic agent status: Secondary | ICD-10-CM | POA: Diagnosis not present

## 2021-11-28 DIAGNOSIS — I10 Essential (primary) hypertension: Secondary | ICD-10-CM | POA: Diagnosis present

## 2021-11-28 DIAGNOSIS — Z91030 Bee allergy status: Secondary | ICD-10-CM | POA: Diagnosis not present

## 2021-11-28 DIAGNOSIS — Z8249 Family history of ischemic heart disease and other diseases of the circulatory system: Secondary | ICD-10-CM | POA: Diagnosis not present

## 2021-11-28 DIAGNOSIS — K8020 Calculus of gallbladder without cholecystitis without obstruction: Secondary | ICD-10-CM | POA: Diagnosis present

## 2021-11-28 DIAGNOSIS — K56609 Unspecified intestinal obstruction, unspecified as to partial versus complete obstruction: Secondary | ICD-10-CM | POA: Diagnosis present

## 2021-11-28 DIAGNOSIS — Z91040 Latex allergy status: Secondary | ICD-10-CM | POA: Diagnosis not present

## 2021-11-28 DIAGNOSIS — Z888 Allergy status to other drugs, medicaments and biological substances status: Secondary | ICD-10-CM | POA: Diagnosis not present

## 2021-11-28 DIAGNOSIS — Z96651 Presence of right artificial knee joint: Secondary | ICD-10-CM | POA: Diagnosis present

## 2021-11-28 DIAGNOSIS — K760 Fatty (change of) liver, not elsewhere classified: Secondary | ICD-10-CM | POA: Diagnosis present

## 2021-11-28 DIAGNOSIS — M479 Spondylosis, unspecified: Secondary | ICD-10-CM | POA: Diagnosis present

## 2021-11-28 LAB — CBC WITH DIFFERENTIAL/PLATELET
Abs Immature Granulocytes: 0.01 10*3/uL (ref 0.00–0.07)
Basophils Absolute: 0 10*3/uL (ref 0.0–0.1)
Basophils Relative: 0 %
Eosinophils Absolute: 0.1 10*3/uL (ref 0.0–0.5)
Eosinophils Relative: 2 %
HCT: 35.7 % — ABNORMAL LOW (ref 36.0–46.0)
Hemoglobin: 11.9 g/dL — ABNORMAL LOW (ref 12.0–15.0)
Immature Granulocytes: 0 %
Lymphocytes Relative: 27 %
Lymphs Abs: 1.3 10*3/uL (ref 0.7–4.0)
MCH: 30.1 pg (ref 26.0–34.0)
MCHC: 33.3 g/dL (ref 30.0–36.0)
MCV: 90.2 fL (ref 80.0–100.0)
Monocytes Absolute: 0.5 10*3/uL (ref 0.1–1.0)
Monocytes Relative: 10 %
Neutro Abs: 2.9 10*3/uL (ref 1.7–7.7)
Neutrophils Relative %: 61 %
Platelets: 196 10*3/uL (ref 150–400)
RBC: 3.96 MIL/uL (ref 3.87–5.11)
RDW: 13.5 % (ref 11.5–15.5)
WBC: 4.7 10*3/uL (ref 4.0–10.5)
nRBC: 0 % (ref 0.0–0.2)

## 2021-11-28 LAB — BASIC METABOLIC PANEL
Anion gap: 9 (ref 5–15)
BUN: 8 mg/dL (ref 6–20)
CO2: 22 mmol/L (ref 22–32)
Calcium: 8.9 mg/dL (ref 8.9–10.3)
Chloride: 108 mmol/L (ref 98–111)
Creatinine, Ser: 0.79 mg/dL (ref 0.44–1.00)
GFR, Estimated: >60 mL/min (ref >60)
Glucose, Bld: 82 mg/dL (ref 70–99)
Potassium: 3.8 mmol/L (ref 3.5–5.1)
Sodium: 139 mmol/L (ref 135–145)

## 2021-11-28 LAB — PROTIME-INR
INR: 1.5 — ABNORMAL HIGH (ref 0.8–1.2)
Prothrombin Time: 17.5 seconds — ABNORMAL HIGH (ref 11.4–15.2)

## 2021-11-28 LAB — APTT: aPTT: 29 seconds (ref 24–36)

## 2021-11-28 MED ORDER — BISACODYL 10 MG RE SUPP: 10.0000 mg | Freq: Every day | RECTAL | Status: DC | PRN

## 2021-11-28 MED ORDER — SODIUM CHLORIDE 0.9% FLUSH: 3.0000 mL | INTRAVENOUS | Status: DC | PRN

## 2021-11-28 MED ORDER — SODIUM CHLORIDE 0.9 % IV SOLN: INTRAVENOUS | Status: DC

## 2021-11-28 MED ORDER — LORATADINE 10 MG PO TABS
10.0000 mg | ORAL_TABLET | Freq: Every day | ORAL | Status: DC
  Administered 2021-11-29 – 2021-12-01 (×3): 10 mg via ORAL
  Filled 2021-11-28 (×3): qty 1

## 2021-11-28 MED ORDER — DIPHENHYDRAMINE HCL 25 MG PO CAPS
25.0000 mg | ORAL_CAPSULE | Freq: Four times a day (QID) | ORAL | Status: DC | PRN
  Administered 2021-11-28: 25 mg via ORAL
  Filled 2021-11-28: qty 1

## 2021-11-28 MED ORDER — SODIUM CHLORIDE 0.9 % IV SOLN: 250.0000 mL | INTRAVENOUS | Status: DC | PRN

## 2021-11-28 MED ORDER — LACTATED RINGERS IV BOLUS: 1000.0000 mL | Freq: Three times a day (TID) | INTRAVENOUS | Status: AC | PRN

## 2021-11-28 MED ORDER — SODIUM CHLORIDE 0.9% FLUSH
3.0000 mL | Freq: Two times a day (BID) | INTRAVENOUS | Status: DC
  Administered 2021-11-28 – 2021-11-30 (×6): 3 mL via INTRAVENOUS

## 2021-11-28 NOTE — Progress Notes (Signed)
HD#0 Subjective:   Summary: 61 year old female with COPD, hypertension, GERD, osteoarthritis, history of hysterectomy and multiple recurrent SBO's who presents with left lower quadrant abdominal pain and vomiting.  Overnight Events: NAEO.  Patient denies nausea or vomiting this morning. Endorses flatus. Feels her abdominal distension has improved. Abdominal pain improved.   Objective:  Vital signs in last 24 hours: Vitals:   11/27/21 2344 11/28/21 0500 11/28/21 0549 11/28/21 0746  BP: 138/72  131/79 134/73  Pulse: 68  78 83  Resp: 16  17 18   Temp: 98 F (36.7 C)  97.9 F (36.6 C) 98.1 F (36.7 C)  TempSrc: Oral  Oral Oral  SpO2: 100%  97% 98%  Weight:  81.5 kg     Supplemental O2: Room Air SpO2: 98 %   Physical Exam:  Constitutional: well-appearing female sitting in hospital bed, in no acute distress HENT: normocephalic atraumatic, mucous membranes moist Eyes: conjunctiva non-erythematous Neck: supple Pulmonary/Chest: normal work of breathing on room air Abdominal: soft, non-tender, mildly distended, normal bowel sounds MSK: normal bulk and tone Neurological: alert & oriented x 3 Skin: warm and dry Psych: Normal mood and affect.  Filed Weights   11/28/21 0500  Weight: 81.5 kg     Intake/Output Summary (Last 24 hours) at 11/28/2021 0931 Last data filed at 11/28/2021 0300 Gross per 24 hour  Intake 1352.73 ml  Output --  Net 1352.73 ml   Net IO Since Admission: 1,352.73 mL [11/28/21 0931]  Pertinent Labs:    Latest Ref Rng & Units 11/28/2021    1:59 AM 11/27/2021    4:46 AM 02/27/2016    6:11 PM  CBC  WBC 4.0 - 10.5 K/uL 4.7  6.0  5.0   Hemoglobin 12.0 - 15.0 g/dL 04/26/2016  09.3  26.7   Hematocrit 36.0 - 46.0 % 35.7  40.8  41.5   Platelets 150 - 400 K/uL 196  268  286        Latest Ref Rng & Units 11/28/2021    1:59 AM 11/27/2021    4:46 AM 02/27/2016    6:11 PM  CMP  Glucose 70 - 99 mg/dL 82  04/26/2016  80   BUN 6 - 20 mg/dL 8  14  8    Creatinine 0.44 -  1.00 mg/dL 580   9.98   Sodium 135 - 145 mmol/L 139  139  142   Potassium 3.5 - 5.1 mmol/L 3.8  3.8  3.8   Chloride 98 - 111 mmol/L 108  103  107   CO2 22 - 32 mmol/L 22  25  27    Calcium 8.9 - 10.3 mg/dL 8.9  3.38  9.9   Total Protein 6.5 - 8.1 g/dL  6.8  7.3   Total Bilirubin 0.3 - 1.2 mg/dL  0.7  0.3   Alkaline Phos 38 - 126 U/L  77  87   AST 15 - 41 U/L  33  23   ALT 0 - 44 U/L  25  19     Imaging: DG Abd Portable 1V-Small Bowel Obstruction Protocol-initial, 8 hr delay  Result Date: 11/27/2021 CLINICAL DATA:  Small-bowel obstruction, 8 hour delay image EXAM: PORTABLE ABDOMEN - 1 VIEW COMPARISON:  11/27/2021 FINDINGS: Supine frontal view of the abdomen and pelvis was obtained 8 hours after oral contrast administration. There is residual contrast within the stomach. Slow diffusion of contrast into dilated loops of small bowel within the right mid abdomen. No evidence of contrast within the colon  at the time of imaging. There is excreted contrast within the urinary bladder. IMPRESSION: 1. Slow diffusion of contrast into dilated loops of small bowel as above, consistent with small-bowel obstruction. No evidence of contrast within the colon at the time of imaging. Electronically Signed   By: Randa Ngo M.D.   On: 11/27/2021 23:23    Assessment/Plan:   Principal Problem:   Small bowel obstruction (HCC) Active Problems:   COPD (chronic obstructive pulmonary disease) (HCC)   Hypertension   GERD (gastroesophageal reflux disease)   Osteoarthritis   Asthma   Chronic low back pain   Patient Summary: Mechanical SBO likely 2/2 adhesions Hx of SBO Hx of multiple prior abdominal surgeries requiring partial small bowel resection Patient presented with abdominal pain and emesis, found to have a SBO on CT imaging with a transition point likely in the LLQ and with likely reactive lymphadenopathy in that area. She has a history of multiple abdominal surgeries, some of which required  partial SB resections, and has a history of multiple SBOs dating back to 2002. Patient improved clinically today. Denies nausea or vomiting. Multiple bowel movements this morning. WBC wnl. Afebrile.  -appreciate surgery recommendations -NPO -NGT decompression -SBO protocol -LR @100cc /hr for maintenance -dilaudid 0.5mg  q4 prn for severe pain, dilaudid 1mg  q4 prn for breakthrough pain (can transition to PO pain management once diet is started) -IV compazine prn for N/V -lovenox for dvt ppx -trend CBC   Incidental finding of tubular mass-like density in RLL 1.4cm tubular mass-like density in the RLL, most likely mucus plugging vs pulmonary pseudoaneurysm. Low concern for neoplastic mass. Per radiology, recommend f/u with CT chest angiogram in 3 months for further characterization and to ensure stability. -CT chest angiogram in 3 months as outpatient   Chronic low back pain 2/2 OA Long-standing low back pain 2/2 osteoarthritis. She manages this with tylenol at home. Pain control as above for now since she is NPO, but can resume prn tylenol once she is able to tolerate PO.   COPD Asthma Doing well from a respiratory perspective at this time. Will resume home medications. -continue albuterol inhaler q6 prn -breo ellipta daily -ipratropium qid -spiriva daily   GERD -IV protonix 40mg  daily while NPO, then transition to home PO omeprazole 40mg  daily.   HTN On norvasc 10mg  daily, lisinopril 20mg  daily, and triamterene-HCTZ 37.5-25mg  daily at home. Holding at this time given she is NPO with plans for NGT placement. Blood pressures 130s-140s since arrival. -holding home antihypertensives, can resume once tolerating PO intake   Depression Anxiety On zoloft 100mg  daily and trazodone 100mg  qhs prn for sleep. -holding while NPO, can resume once able   Dispo: Admit patient to Observation with expected length of stay less than 2 midnights.  Linward Natal MD Internal Medicine Resident  PGY-1 Please contact the on call pager after 5 pm and on weekends at [phone].

## 2021-11-28 NOTE — Progress Notes (Signed)
[name] [id number] 1960-12-19  CARE TEAM:  PCP: Pcp, No  Outpatient Care Team: Patient Care Team: Pcp, No as PCP - General (Plastic Surgery)  Inpatient Treatment Team: Treatment Team: Attending Provider: Gust Rung, DO; Rounding Team: Montez Morita, Md, MD; Rounding Team: (Rounding), Imts - Lorelle Gibbs, MD; Utilization Review: Clydia Llano, RN; Registered Nurse: Melony Overly, RN; Pharmacist: Jamal Collin, [facility]; Technician: Tacy Learn, NT; Registered Nurse: Sonny Masters, RN; Case Manager: Isaias Cowman, RN; Social Worker: Jimmy Picket, LCSW   Problem List:   Principal Problem:   Small bowel obstruction [facility]) Active Problems:   COPD (chronic obstructive pulmonary disease) (HCC)   Hypertension   GERD (gastroesophageal reflux disease)   Osteoarthritis   Asthma   Chronic low back pain      * No surgery found *      Assessment  SBO in the setting of numerous prior abdominal surgeries.  Most likely due to adhesions.  [facility] Stay = 0 days)  SBO - CT 10/6 c/w dilated loops of bowel in the RUQ - appear chronic, but increased compared to scan in 2018. New dilated loops in LLQ with transition zone.  -Clinically passing gas with loose bowel movements.  X-ray this morning shows contrast in colon.  Reassuring bowel obstruction resolving.  Clear liquids.  Advance to dysphagia 1/fulls as tolerated.  If tolerates well then most likely advance to soft diet in the morning and possibly discharge tomorrow if improves rapidly as we hope.  Feels worse, back to n.p.o. and NG tube if vomits.  Hopefully less likely now.  We will see.     FEN - NPO, NG to LIWS VTE - SCD's, ok for chemical VTE ID - none indicated Admit - to medical service    HTN HLD GERD COPD Depression  Disposition: Per primary service.  Hopefully home 10/8 if improves rapidly       I reviewed nursing notes, hospitalist notes, last 24 h vitals and pain scores, last  48 h intake and output, last 24 h labs and trends, and last 24 h imaging results. I have reviewed this patient's available data, including medical history, events of note, test results, etc as part of my evaluation.  A significant portion of that time was spent in counseling.  Care during the described time interval was provided by me.  This care required moderate level of medical decision making.  11/28/2021    Subjective: (Chief complaint)  Patient had loose bowel movements.  Abdomen less cramping distended.  Feels better.  No nausea or vomiting.    Objective:  Vital signs:  Vitals:   11/28/21 0549 11/28/21 0746 11/28/21 0934 11/28/21 0935  BP: 131/79 134/73    Pulse: 78 83    Resp: 17 18    Temp: 97.9 F (36.6 C) 98.1 F (36.7 C)    TempSrc: Oral Oral    SpO2: 97% 98% 96% 96%  Weight:        Last BM Date : 11/28/21  Intake/Output   Yesterday:  10/06 0701 - 10/07 0700 In: 1352.7 [I.V.:1352.7] Out: -  This shift:  No intake/output data recorded.  Bowel function:  Flatus: YES  BM:  YES  Drain: (No drain)   Physical Exam:  General: Pt awake/alert in no acute distress Eyes: PERRL, normal EOM.  Sclera clear.  No icterus Neuro: CN II-XII intact w/o focal sensory/motor deficits. Lymph: No head/neck/groin lymphadenopathy Psych:  No delerium/psychosis/paranoia.  Oriented x 4 HENT: Normocephalic,  Mucus membranes moist.  No thrush Neck: Supple, No tracheal deviation.  No obvious thyromegaly Chest: No pain to chest wall compression.  Good respiratory excursion.  No audible wheezing CV:  Pulses intact.  Regular rhythm.  No major extremity edema MS: Normal AROM mjr joints.  No obvious deformity  Abdomen: Soft.  Mildy distended.  Nontender.  No evidence of peritonitis.  No incarcerated hernias.  Ext:   No deformity.  No mjr edema.  No cyanosis Skin: No petechiae / purpurea.  No major sores.  Warm and dry    Results:   Cultures: No results found for this or  any previous visit (from the past 720 hour(s)).  Labs: Results for orders placed or performed during the hospital encounter of 11/27/21 (from the past 48 hour(s))  CBC with Differential     Status: None   Collection Time: 11/27/21  4:46 AM  Result Value Ref Range   WBC 6.0 4.0 - 10.5 K/uL   RBC 4.46 3.87 - 5.11 MIL/uL   Hemoglobin 13.4 12.0 - 15.0 g/dL   HCT 56.2 13.0 - 86.5 %   MCV 91.5 80.0 - 100.0 fL   MCH 30.0 26.0 - 34.0 pg   MCHC 32.8 30.0 - 36.0 g/dL   RDW 78.4 69.6 - 29.5 %   Platelets 268 150 - 400 K/uL   nRBC 0.0 0.0 - 0.2 %   Neutrophils Relative % 64 %   Neutro Abs 3.9 1.7 - 7.7 K/uL   Lymphocytes Relative 26 %   Lymphs Abs 1.5 0.7 - 4.0 K/uL   Monocytes Relative 9 %   Monocytes Absolute 0.5 0.1 - 1.0 K/uL   Eosinophils Relative 1 %   Eosinophils Absolute 0.1 0.0 - 0.5 K/uL   Basophils Relative 0 %   Basophils Absolute 0.0 0.0 - 0.1 K/uL   Immature Granulocytes 0 %   Abs Immature Granulocytes 0.01 0.00 - 0.07 K/uL    Comment: Performed at [facility], [address], [place], [place] [address]  Comprehensive metabolic panel     Status: Abnormal   Collection Time: 11/27/21  4:46 AM  Result Value Ref Range   Sodium 139 135 - 145 mmol/L   Potassium 3.8 3.5 - 5.1 mmol/L   Chloride 103 98 - 111 mmol/L   CO2 25 22 - 32 mmol/L   Glucose, Bld 105 (H) 70 - 99 mg/dL    Comment: Glucose reference range applies only to samples taken after fasting for at least 8 hours.   BUN 14 6 - 20 mg/dL   Creatinine, Ser 2.44 0.44 - 1.00 mg/dL   Calcium 01.0 8.9 - 27.2 mg/dL   Total Protein 6.8 6.5 - 8.1 g/dL   Albumin 4.1 3.5 - 5.0 g/dL   AST 33 15 - 41 U/L   ALT 25 0 - 44 U/L   Alkaline Phosphatase 77 38 - 126 U/L   Total Bilirubin 0.7 0.3 - 1.2 mg/dL   GFR, Estimated >53 >66 mL/min    Comment: (NOTE) Calculated using the CKD-EPI Creatinine Equation (2021)    Anion gap 11 5 - 15    Comment: Performed at [facility], [address], [place], [place] [address]   Lipase, blood     Status: None   Collection Time: 11/27/21  4:46 AM  Result Value Ref Range   Lipase 28 11 - 51 U/L    Comment: Performed at [facility], [address] [place], [place] [address]  Lactic acid, plasma     Status: None   Collection Time: 11/27/21 11:35 AM  Result Value Ref Range   Lactic Acid, Venous 0.8 0.5 - 1.9 mmol/L    Comment: Performed at [facility], [address] [place], [place] [address]  Urinalysis, Routine w reflex microscopic Urine, Clean Catch     Status: Abnormal   Collection Time: 11/27/21 11:40 AM  Result Value Ref Range   Color, Urine YELLOW YELLOW   APPearance CLEAR CLEAR   Specific Gravity, Urine >1.046 (H) 1.005 - 1.030   pH 5.0 5.0 - 8.0   Glucose, UA NEGATIVE NEGATIVE mg/dL   Hgb urine dipstick NEGATIVE NEGATIVE   Bilirubin Urine NEGATIVE NEGATIVE   Ketones, ur NEGATIVE NEGATIVE mg/dL   Protein, ur NEGATIVE NEGATIVE mg/dL   Nitrite NEGATIVE NEGATIVE   Leukocytes,Ua TRACE (A) NEGATIVE   RBC / HPF 0-5 0 - 5 RBC/hpf   WBC, UA 6-10 0 - 5 WBC/hpf   Bacteria, UA RARE (A) NONE SEEN   Squamous Epithelial / LPF 0-5 0 - 5   Mucus PRESENT     Comment: Performed at [facility], [address] [place], [place] [address]  Magnesium     Status: None   Collection Time: 11/27/21  1:34 PM  Result Value Ref Range   Magnesium 2.0 1.7 - 2.4 mg/dL    Comment: Performed at [facility], [address] [place], [place] [address]  APTT     Status: None   Collection Time: 11/27/21  1:34 PM  Result Value Ref Range   aPTT 30 24 - 36 seconds    Comment: Performed at [facility], [address] [place], [place] [address]  Protime-INR     Status: Abnormal   Collection Time: 11/27/21  1:34 PM  Result Value Ref Range   Prothrombin Time 17.6 (H) 11.4 - 15.2 seconds   INR 1.5 (H) 0.8 - 1.2    Comment: (NOTE) INR goal varies based on device and disease states. Performed at [facility], [address] [place],  [place] [address]   CBC with Differential/Platelet     Status: Abnormal   Collection Time: 11/28/21  1:59 AM  Result Value Ref Range   WBC 4.7 4.0 - 10.5 K/uL   RBC 3.96 3.87 - 5.11 MIL/uL   Hemoglobin 11.9 (L) 12.0 - 15.0 g/dL   HCT 69.635.7 (L) 29.536.0 - 28.446.0 %   MCV 90.2 80.0 - 100.0 fL   MCH 30.1 26.0 - 34.0 pg   MCHC 33.3 30.0 - 36.0 g/dL   RDW 13.213.5 44.011.5 - 10.215.5 %   Platelets 196 150 - 400 K/uL   nRBC 0.0 0.0 - 0.2 %   Neutrophils Relative % 61 %   Neutro Abs 2.9 1.7 - 7.7 K/uL   Lymphocytes Relative 27 %   Lymphs Abs 1.3 0.7 - 4.0 K/uL   Monocytes Relative 10 %   Monocytes Absolute 0.5 0.1 - 1.0 K/uL   Eosinophils Relative 2 %   Eosinophils Absolute 0.1 0.0 - 0.5 K/uL   Basophils Relative 0 %   Basophils Absolute 0.0 0.0 - 0.1 K/uL   Immature Granulocytes 0 %   Abs Immature Granulocytes 0.01 0.00 - 0.07 K/uL    Comment: Performed at [facility], [address] [place], [place] [address]  Basic metabolic panel     Status: None   Collection Time: 11/28/21  1:59 AM  Result Value Ref Range   Sodium 139 135 - 145 mmol/L   Potassium 3.8 3.5 - 5.1 mmol/L   Chloride 108 98 - 111 mmol/L   CO2 22 22 - 32 mmol/L   Glucose, Bld 82 70 - 99 mg/dL    Comment: Glucose reference range applies only to samples taken after fasting for at least 8 hours.   BUN 8 6 - 20 mg/dL   Creatinine, Ser 1.74 0.44 - 1.00 mg/dL   Calcium 8.9 8.9 - 08.1 mg/dL   GFR, Estimated >44 >81 mL/min    Comment: (NOTE) Calculated using the CKD-EPI Creatinine Equation (2021)    Anion gap 9 5 - 15    Comment: Performed at [facility], [address] [place], [place] [address]  Protime-INR     Status: Abnormal   Collection Time: 11/28/21  1:59 AM  Result Value Ref Range   Prothrombin Time 17.5 (H) 11.4 - 15.2 seconds   INR 1.5 (H) 0.8 - 1.2    Comment: (NOTE) INR goal varies based on device and disease states. Performed at [facility], [address], [place], [place] [address]   APTT     Status:  None   Collection Time: 11/28/21  1:59 AM  Result Value Ref Range   aPTT 29 24 - 36 seconds    Comment: Performed at [facility], [address] [place], [place] [address]    Imaging / Studies: DG Abd Portable 1V-Small Bowel Obstruction Protocol-initial, 8 hr delay  Result Date: 11/27/2021 CLINICAL DATA:  Small-bowel obstruction, 8 hour delay image EXAM: PORTABLE ABDOMEN - 1 VIEW COMPARISON:  11/27/2021 FINDINGS: Supine frontal view of the abdomen and pelvis was obtained 8 hours after oral contrast administration. There is residual contrast within the stomach. Slow diffusion of contrast into dilated loops of small bowel within the right mid abdomen. No evidence of contrast within the colon at the time of imaging. There is excreted contrast within the urinary bladder. IMPRESSION: 1. Slow diffusion of contrast into dilated loops of small bowel as above, consistent with small-bowel obstruction. No evidence of contrast within the colon at the time of imaging. Electronically Signed   By: Sharlet Salina M.D.   On: 11/27/2021 23:23   CT ABDOMEN PELVIS W CONTRAST  Result Date: 11/27/2021 CLINICAL DATA:  Bowel obstruction suspected. Abdominal pain, bloating, nausea and vomiting. History of small-bowel obstruction. EXAM: CT ABDOMEN AND PELVIS WITH CONTRAST TECHNIQUE: Multidetector CT imaging of the abdomen and pelvis was performed using the standard protocol following bolus administration of intravenous contrast. RADIATION DOSE REDUCTION: This exam was performed according to the departmental dose-optimization program which includes automated exposure control, adjustment of the mA and/or kV according to patient size and/or use of iterative reconstruction technique. CONTRAST:  14mL OMNIPAQUE IOHEXOL 350 MG/ML SOLN COMPARISON:  CT abdomen and pelvis dated 02/27/2016 and 08/14/2015. FINDINGS: Lower chest: Tubular masslike density within the lateral aspects of the RIGHT lower lobe, measuring 1.4 cm (series 4,  image 9). Lung bases otherwise clear. Hepatobiliary: Liver is diffusely low in density suggesting fatty infiltration. Multiple stones within the nondistended gallbladder. No bile duct dilatation seen. Pancreas: Unremarkable. No pancreatic ductal dilatation or surrounding inflammatory changes. Spleen: Normal in size without focal abnormality. Adrenals/Urinary Tract: Adrenal glands appear normal. 4 mm nonobstructing RIGHT renal stone. Kidneys otherwise unremarkable without suspicious mass or hydronephrosis. Bladder appears normal. Stomach/Bowel: Chronically distended small bowel within the RIGHT upper abdomen, although at least mildly increased compared  to previous exams. However, newly dilated small bowel loops within the LEFT abdomen with transition point in the LEFT lower quadrant, with associated air-fluid levels, consistent with acute small bowel obstruction. Relatively decreased small bowel loops are seen within the LEFT lower quadrant to pelvis, but air is seen throughout the normal-caliber colon, consistent with early complete small bowel obstruction or high-grade partial small bowel obstruction. Stomach is also distended with fluid. Thickening of the walls of the small bowel loops upper quadrant is likely reactive, possibly contributory. Vascular/Lymphatic: No acute-appearing vascular abnormality is seen. No abdominal aortic aneurysm. Clustered small and moderate-sized lymph nodes within the central abdominal mesentery, likely reactive in nature. Reproductive: Presumed hysterectomy.  No adnexal mass or free fluid. Other: No free fluid or abscess collection is seen. No free intraperitoneal air. No evidence of portal venous gas or pneumatosis intestinalis. Musculoskeletal: No acute or suspicious osseous abnormality. IMPRESSION: 1. Acute high-grade partial small-bowel obstruction versus early complete small bowel obstruction within the LEFT abdomen, with transition point likely in the LEFT lower quadrant given  the relatively decompressed small bowel in the LEFT lower quadrant. No obstructing mass is seen, possibly adhesions. Chronically distended small bowel within the RIGHT upper abdomen, although at least mildly increased compared to previous exams due to the superimposed acute obstruction in the LEFT abdomen. 2. Clustered small and moderate-sized lymph nodes within the central abdominal mesentery, likely reactive in nature. 3. **An incidental finding of potential clinical significance has been found. Tubular masslike density within the lateral aspects of the RIGHT lower lobe, measuring 1.4 cm, most likely mucous plugging, alternatively incidental pulmonary pseudoaneurysm, much less likely neoplastic mass. Given that it is much less likely to be a neoplastic mass, merely recommend follow-up chest CT angiogram in 3 months for further characterization and to ensure stability. ** 4. Fatty infiltration of the liver. 5. Cholelithiasis without evidence of acute cholecystitis. 6. 4 mm nonobstructing RIGHT renal stone. Electronically Signed   By: Franki Cabot M.D.   On: 11/27/2021 09:31    Medications / Allergies: per chart  Antibiotics: Anti-infectives (From admission, onward)    None         Note: Portions of this report may have been transcribed using voice recognition software. Every effort was made to ensure accuracy; however, inadvertent computerized transcription errors may be present.   Any transcriptional errors that result from this process are unintentional.    Adin Hector, MD, FACS, MASCRS Esophageal, Gastrointestinal & Colorectal Surgery Robotic and Minimally Invasive Surgery  [facility] [place]. [address], [place], [place] [address] [phone] Fax [phone] Main  CONTACT INFORMATION:  Weekday (9AM-5PM): Call [facility] main office at [phone]  Weeknight (5PM-9AM) or Weekend/Holiday: Check [email or web address] (password "  [email or web address]") for General Surgery [facility] coverage  (Please, do not use SecureChat as it is not reliable communication to reach operating surgeons for immediate patient care given surgeries/outpatient duties/clinic/cross-coverage/off post-call which would lead to a delay in care.  Epic staff messaging available for outptient concerns, but may not be answered for 48 hours or more).     11/28/2021  10:49 AM

## 2021-11-28 NOTE — Plan of Care (Signed)

## 2021-11-29 DIAGNOSIS — K56609 Unspecified intestinal obstruction, unspecified as to partial versus complete obstruction: Secondary | ICD-10-CM

## 2021-11-29 DIAGNOSIS — R112 Nausea with vomiting, unspecified: Secondary | ICD-10-CM

## 2021-11-29 LAB — CBC
HCT: 34 % — ABNORMAL LOW (ref 36.0–46.0)
Hemoglobin: 11.4 g/dL — ABNORMAL LOW (ref 12.0–15.0)
MCH: 29.8 pg (ref 26.0–34.0)
MCHC: 33.5 g/dL (ref 30.0–36.0)
MCV: 89 fL (ref 80.0–100.0)
Platelets: 178 10*3/uL (ref 150–400)
RBC: 3.82 MIL/uL — ABNORMAL LOW (ref 3.87–5.11)
RDW: 13.3 % (ref 11.5–15.5)
WBC: 4.3 10*3/uL (ref 4.0–10.5)
nRBC: 0.5 % — ABNORMAL HIGH (ref 0.0–0.2)

## 2021-11-29 LAB — BASIC METABOLIC PANEL
Anion gap: 7 (ref 5–15)
BUN: 6 mg/dL (ref 6–20)
CO2: 24 mmol/L (ref 22–32)
Calcium: 8.6 mg/dL — ABNORMAL LOW (ref 8.9–10.3)
Chloride: 107 mmol/L (ref 98–111)
Creatinine, Ser: 0.64 mg/dL (ref 0.44–1.00)
GFR, Estimated: >60 mL/min (ref >60)
Glucose, Bld: 87 mg/dL (ref 70–99)
Potassium: 3.5 mmol/L (ref 3.5–5.1)
Sodium: 138 mmol/L (ref 135–145)

## 2021-11-29 MED ORDER — ACETAMINOPHEN 325 MG PO TABS: 650.0000 mg | ORAL_TABLET | Freq: Four times a day (QID) | ORAL | Status: DC | PRN

## 2021-11-29 MED ORDER — PHENOL 1.4 % MT LIQD: 2.0000 | OROMUCOSAL | Status: DC | PRN

## 2021-11-29 MED ORDER — MAGIC MOUTHWASH
15.0000 mL | Freq: Once | ORAL | Status: AC
  Administered 2021-11-29: 15 mL via ORAL
  Filled 2021-11-29: qty 15

## 2021-11-29 MED ORDER — MENTHOL 3 MG MT LOZG: 1.0000 | LOZENGE | OROMUCOSAL | Status: DC | PRN

## 2021-11-29 MED ORDER — KETOROLAC TROMETHAMINE 15 MG/ML IJ SOLN: 15.0000 mg | Freq: Four times a day (QID) | INTRAMUSCULAR | Status: DC | PRN

## 2021-11-29 MED ORDER — ALUM & MAG HYDROXIDE-SIMETH 200-200-20 MG/5ML PO SUSP: 30.0000 mL | Freq: Four times a day (QID) | ORAL | Status: DC | PRN

## 2021-11-29 MED ORDER — MAGIC MOUTHWASH: 15.0000 mL | Freq: Four times a day (QID) | ORAL | Status: DC | PRN

## 2021-11-29 MED ORDER — SIMETHICONE 40 MG/0.6ML PO SUSP: 80.0000 mg | Freq: Four times a day (QID) | ORAL | Status: DC | PRN

## 2021-11-29 MED ORDER — SERTRALINE HCL 100 MG PO TABS: 100.0000 mg | ORAL_TABLET | Freq: Every day | ORAL | Status: DC

## 2021-11-29 NOTE — Progress Notes (Addendum)
[name] [id number] 02/09/1961  CARE TEAM:  PCP: Pcp, No  Outpatient Care Team: Patient Care Team: Pcp, No as PCP - General (Plastic Surgery)  Inpatient Treatment Team: Treatment Team: Attending Provider: Lucious Groves, DO; Rounding Team: Edison Pace, Md, MD; Rounding Team: (Rounding), Imts - Vanita Ingles, MD; Pharmacist: Jerilynn Birkenhead, [facility]; Utilization Review: Alease Medina, RN; Technician: Adria Dill, NT; Registered Nurse: Salina April, RN; Social Worker: Vern Claude, [place]   Problem List:   Principal Problem:   Small bowel obstruction [facility]) Active Problems:   COPD (chronic obstructive pulmonary disease) ([place])   Hypertension   GERD (gastroesophageal reflux disease)   Osteoarthritis   Asthma   Chronic low back pain      * No surgery found *      Assessment  Improving  [facility] Stay = 1 days)  SBO in the setting of numerous prior abdominal surgeries.  Most likely due to adhesions. - CT 10/6 c/w dilated loops of bowel in the RUQ - appear chronic, but increased compared to scan in 2018. New dilated loops in LLQ with transition zone.  -Clinically passing gas with loose bowel movements.  X-ray this morning shows contrast in colon.  Reassuring bowel obstruction resolving.  Tolerating dysphagia 1/full liquid diet.  Advance to soft diet.  If tolerates most likely go home later today.  If feels worse, back to n.p.o. and NG tube if vomits.  Hopefully less likely now.  We will see.   Recommend fiber bowel regimen.  Doing FiberCon absorbable for now.  Can defer to switch to MiraLAX.  FEN - NPO, NG to LIWS VTE - SCD's, ok for chemical VTE ID - none indicated Admit - to medical service    Sore throat.  No major hoarseness.  Magic mouthwash x1.  Defer to primary service to rule out infectious or other concerns HTN HLD GERD COPD Depression  Disposition: Per primary service.  Hopefully home later today 10/8 if improves rapidly  Do not anticipate need  to follow-up with surgery.     I reviewed nursing notes, hospitalist notes, last 24 h vitals and pain scores, last 48 h intake and output, last 24 h labs and trends, and last 24 h imaging results. I have reviewed this patient's available data, including medical history, events of note, test results, etc as part of my evaluation.  A significant portion of that time was spent in counseling.  Care during the described time interval was provided by me.  This care required moderate level of medical decision making.  11/29/2021    Subjective: (Chief complaint)  Patient tolerated thicker liquids.  No nausea or vomiting.  Complains of sore throat.   Objective:  Vital signs:  Vitals:   11/29/21 0448 11/29/21 0500 11/29/21 0813 11/29/21 0823  BP: (!) 146/78  (!) 140/64   Pulse: 69  67 68  Resp: 19  15 16   Temp: 98 F (36.7 C)  98.2 F (36.8 C)   TempSrc: Oral  Oral   SpO2: 99%  98%   Weight:  81.3 kg      Last BM Date : 11/28/21  Intake/Output   Yesterday:  10/07 0701 - 10/08 0700 In: 3 [I.V.:3] Out: -  This shift:  No intake/output data recorded.  Bowel function:  Flatus: YES  BM:  YES  Drain: (No drain)   Physical Exam:  General: Pt awake/alert in no acute distress Eyes: PERRL, normal EOM.  Sclera clear.  No icterus  Neuro: CN II-XII intact w/o focal sensory/motor deficits. Lymph: No head/neck/groin lymphadenopathy Psych:  No delerium/psychosis/paranoia.  Oriented x 4 HENT: Normocephalic, Mucus membranes moist.  No thrush.  Occasional clearing of throat but no major hoarseness Neck: Supple, No tracheal deviation.  No obvious thyromegaly Chest: No pain to chest wall compression.  Good respiratory excursion.  No audible wheezing CV:  Pulses intact.  Regular rhythm.  No major extremity edema MS: Normal AROM mjr joints.  No obvious deformity  Abdomen: Soft.  Nondistended.  Nontender.  No evidence of peritonitis.  No incarcerated hernias.  Ext:   No deformity.  No  mjr edema.  No cyanosis Skin: No petechiae / purpurea.  No major sores.  Warm and dry    Results:   Cultures: No results found for this or any previous visit (from the past 720 hour(s)).  Labs: Results for orders placed or performed during the hospital encounter of 11/27/21 (from the past 48 hour(s))  Lactic acid, plasma     Status: None   Collection Time: 11/27/21 11:35 AM  Result Value Ref Range   Lactic Acid, Venous 0.8 0.5 - 1.9 mmol/L    Comment: Performed at [facility], [address] [place], [place] [address]  Urinalysis, Routine w reflex microscopic Urine, Clean Catch     Status: Abnormal   Collection Time: 11/27/21 11:40 AM  Result Value Ref Range   Color, Urine YELLOW YELLOW   APPearance CLEAR CLEAR   Specific Gravity, Urine >1.046 (H) 1.005 - 1.030   pH 5.0 5.0 - 8.0   Glucose, UA NEGATIVE NEGATIVE mg/dL   Hgb urine dipstick NEGATIVE NEGATIVE   Bilirubin Urine NEGATIVE NEGATIVE   Ketones, ur NEGATIVE NEGATIVE mg/dL   Protein, ur NEGATIVE NEGATIVE mg/dL   Nitrite NEGATIVE NEGATIVE   Leukocytes,Ua TRACE (A) NEGATIVE   RBC / HPF 0-5 0 - 5 RBC/hpf   WBC, UA 6-10 0 - 5 WBC/hpf   Bacteria, UA RARE (A) NONE SEEN   Squamous Epithelial / LPF 0-5 0 - 5   Mucus PRESENT     Comment: Performed at [facility], [address] [place], [place] [address]  Magnesium     Status: None   Collection Time: 11/27/21  1:34 PM  Result Value Ref Range   Magnesium 2.0 1.7 - 2.4 mg/dL    Comment: Performed at [facility], [address], [place], [place] [address]  APTT     Status: None   Collection Time: 11/27/21  1:34 PM  Result Value Ref Range   aPTT 30 24 - 36 seconds    Comment: Performed at [facility], [address], [place], [place] [address]  Protime-INR     Status: Abnormal   Collection Time: 11/27/21  1:34 PM  Result Value Ref Range   Prothrombin Time 17.6 (H) 11.4 - 15.2 seconds   INR 1.5 (H) 0.8 - 1.2    Comment: (NOTE) INR goal varies  based on device and disease states. Performed at [facility], [address] [place], [place] [address]   CBC with Differential/Platelet     Status: Abnormal   Collection Time: 11/28/21  1:59 AM  Result Value Ref Range   WBC 4.7 4.0 - 10.5 K/uL   RBC 3.96 3.87 - 5.11 MIL/uL   Hemoglobin 11.9 (L) 12.0 - 15.0 g/dL   HCT 16.6 (L) 06.3 - 01.6 %   MCV 90.2 80.0 - 100.0 fL  MCH 30.1 26.0 - 34.0 pg   MCHC 33.3 30.0 - 36.0 g/dL   RDW 13.5 11.5 - 15.5 %   Platelets 196 150 - 400 K/uL   nRBC 0.0 0.0 - 0.2 %   Neutrophils Relative % 61 %   Neutro Abs 2.9 1.7 - 7.7 K/uL   Lymphocytes Relative 27 %   Lymphs Abs 1.3 0.7 - 4.0 K/uL   Monocytes Relative 10 %   Monocytes Absolute 0.5 0.1 - 1.0 K/uL   Eosinophils Relative 2 %   Eosinophils Absolute 0.1 0.0 - 0.5 K/uL   Basophils Relative 0 %   Basophils Absolute 0.0 0.0 - 0.1 K/uL   Immature Granulocytes 0 %   Abs Immature Granulocytes 0.01 0.00 - 0.07 K/uL    Comment: Performed at [place] [address], [place], [place] [id number]  Basic metabolic panel     Status: None   Collection Time: 11/28/21  1:59 AM  Result Value Ref Range   Sodium 139 135 - 145 mmol/L   Potassium 3.8 3.5 - 5.1 mmol/L   Chloride 108 98 - 111 mmol/L   CO2 22 22 - 32 mmol/L   Glucose, Bld 82 70 - 99 mg/dL    Comment: Glucose reference range applies only to samples taken after fasting for at least 8 hours.   BUN 8 6 - 20 mg/dL   Creatinine, Ser 0.79 0.44 - 1.00 mg/dL   Calcium 8.9 8.9 - 10.3 mg/dL   GFR, Estimated >60 >60 mL/min    Comment: (NOTE) Calculated using the CKD-EPI Creatinine Equation (2021)    Anion gap 9 5 - 15    Comment: Performed at [place] [address] [place], [place] [address]  Protime-INR     Status: Abnormal   Collection Time: 11/28/21  1:59 AM  Result Value Ref Range   Prothrombin Time 17.5 (H) 11.4 - 15.2 seconds   INR 1.5 (H) 0.8 - 1.2    Comment: (NOTE) INR goal varies based on device and disease  states. Performed at [facility], [place] [address], [place], [place] [address]   APTT     Status: None   Collection Time: 11/28/21  1:59 AM  Result Value Ref Range   aPTT 29 24 - 36 seconds    Comment: Performed at [place] [address], [place], [place] [address]  CBC     Status: Abnormal   Collection Time: 11/29/21  2:55 AM  Result Value Ref Range   WBC 4.3 4.0 - 10.5 K/uL   RBC 3.82 (L) 3.87 - 5.11 MIL/uL   Hemoglobin 11.4 (L) 12.0 - 15.0 g/dL   HCT 34.0 (L) 36.0 - 46.0 %   MCV 89.0 80.0 - 100.0 fL   MCH 29.8 26.0 - 34.0 pg   MCHC 33.5 30.0 - 36.0 g/dL   RDW 13.3 11.5 - 15.5 %   Platelets 178 150 - 400 K/uL   nRBC 0.5 (H) 0.0 - 0.2 %    Comment: Performed at [facility], [place] [address], [place], [place] [id number]  Basic metabolic panel     Status: Abnormal   Collection Time: 11/29/21  2:55 AM  Result Value Ref Range   Sodium 138 135 - 145 mmol/L   Potassium 3.5 3.5 - 5.1 mmol/L   Chloride 107 98 - 111 mmol/L   CO2 24 22 - 32 mmol/L   Glucose, Bld 87 70 - 99 mg/dL  Comment: Glucose reference range applies only to samples taken after fasting for at least 8 hours.   BUN 6 6 - 20 mg/dL   Creatinine, Ser 0.64 0.44 - 1.00 mg/dL   Calcium 8.6 (L) 8.9 - 10.3 mg/dL   GFR, Estimated >60 >60 mL/min    Comment: (NOTE) Calculated using the CKD-EPI Creatinine Equation (2021)    Anion gap 7 5 - 15    Comment: Performed at [place] [address] [place], [place] [address]    Imaging / Studies: DG Abd Portable 1V  Result Date: 11/28/2021 CLINICAL DATA:  Abdominal distension. History of small-bowel obstruction. EXAM: PORTABLE ABDOMEN - 1 VIEW COMPARISON:  11/27/2021 and older exams. FINDINGS: Decreased degree of bowel dilation compared to the previous day's study. Contrast is noted within loops of bowel in the right and left abdomen and in the pelvis, consistent with colon. IMPRESSION: 1. Interval improvement. Significant decrease in bowel  dilation compared to the previous day's study. 2. Contrast visualized in the colon. Electronically Signed   By: Lajean Manes M.D.   On: 11/28/2021 11:15   DG Abd Portable 1V-Small Bowel Obstruction Protocol-initial, 8 hr delay  Result Date: 11/27/2021 CLINICAL DATA:  Small-bowel obstruction, 8 hour delay image EXAM: PORTABLE ABDOMEN - 1 VIEW COMPARISON:  11/27/2021 FINDINGS: Supine frontal view of the abdomen and pelvis was obtained 8 hours after oral contrast administration. There is residual contrast within the stomach. Slow diffusion of contrast into dilated loops of small bowel within the right mid abdomen. No evidence of contrast within the colon at the time of imaging. There is excreted contrast within the urinary bladder. IMPRESSION: 1. Slow diffusion of contrast into dilated loops of small bowel as above, consistent with small-bowel obstruction. No evidence of contrast within the colon at the time of imaging. Electronically Signed   By: Randa Ngo M.D.   On: 11/27/2021 23:23   CT ABDOMEN PELVIS W CONTRAST  Result Date: 11/27/2021 CLINICAL DATA:  Bowel obstruction suspected. Abdominal pain, bloating, nausea and vomiting. History of small-bowel obstruction. EXAM: CT ABDOMEN AND PELVIS WITH CONTRAST TECHNIQUE: Multidetector CT imaging of the abdomen and pelvis was performed using the standard protocol following bolus administration of intravenous contrast. RADIATION DOSE REDUCTION: This exam was performed according to the departmental dose-optimization program which includes automated exposure control, adjustment of the mA and/or kV according to patient size and/or use of iterative reconstruction technique. CONTRAST:  56mL OMNIPAQUE IOHEXOL 350 MG/ML SOLN COMPARISON:  CT abdomen and pelvis dated 02/27/2016 and 08/14/2015. FINDINGS: Lower chest: Tubular masslike density within the lateral aspects of the RIGHT lower lobe, measuring 1.4 cm (series 4, image 9). Lung bases otherwise clear.  Hepatobiliary: Liver is diffusely low in density suggesting fatty infiltration. Multiple stones within the nondistended gallbladder. No bile duct dilatation seen. Pancreas: Unremarkable. No pancreatic ductal dilatation or surrounding inflammatory changes. Spleen: Normal in size without focal abnormality. Adrenals/Urinary Tract: Adrenal glands appear normal. 4 mm nonobstructing RIGHT renal stone. Kidneys otherwise unremarkable without suspicious mass or hydronephrosis. Bladder appears normal. Stomach/Bowel: Chronically distended small bowel within the RIGHT upper abdomen, although at least mildly increased compared to previous exams. However, newly dilated small bowel loops within the LEFT abdomen with transition point in the LEFT lower quadrant, with associated air-fluid levels, consistent with acute small bowel obstruction. Relatively decreased small bowel loops are seen within the LEFT lower quadrant to pelvis, but air is seen throughout the normal-caliber colon, consistent with early complete small bowel obstruction or high-grade  partial small bowel obstruction. Stomach is also distended with fluid. Thickening of the walls of the small bowel loops upper quadrant is likely reactive, possibly contributory. Vascular/Lymphatic: No acute-appearing vascular abnormality is seen. No abdominal aortic aneurysm. Clustered small and moderate-sized lymph nodes within the central abdominal mesentery, likely reactive in nature. Reproductive: Presumed hysterectomy.  No adnexal mass or free fluid. Other: No free fluid or abscess collection is seen. No free intraperitoneal air. No evidence of portal venous gas or pneumatosis intestinalis. Musculoskeletal: No acute or suspicious osseous abnormality. IMPRESSION: 1. Acute high-grade partial small-bowel obstruction versus early complete small bowel obstruction within the LEFT abdomen, with transition point likely in the LEFT lower quadrant given the relatively decompressed small bowel  in the LEFT lower quadrant. No obstructing mass is seen, possibly adhesions. Chronically distended small bowel within the RIGHT upper abdomen, although at least mildly increased compared to previous exams due to the superimposed acute obstruction in the LEFT abdomen. 2. Clustered small and moderate-sized lymph nodes within the central abdominal mesentery, likely reactive in nature. 3. **An incidental finding of potential clinical significance has been found. Tubular masslike density within the lateral aspects of the RIGHT lower lobe, measuring 1.4 cm, most likely mucous plugging, alternatively incidental pulmonary pseudoaneurysm, much less likely neoplastic mass. Given that it is much less likely to be a neoplastic mass, merely recommend follow-up chest CT angiogram in 3 months for further characterization and to ensure stability. ** 4. Fatty infiltration of the liver. 5. Cholelithiasis without evidence of acute cholecystitis. 6. 4 mm nonobstructing RIGHT renal stone. Electronically Signed   By: Franki Cabot M.D.   On: 11/27/2021 09:31    Medications / Allergies: per chart  Antibiotics: Anti-infectives (From admission, onward)    None         Note: Portions of this report may have been transcribed using voice recognition software. Every effort was made to ensure accuracy; however, inadvertent computerized transcription errors may be present.   Any transcriptional errors that result from this process are unintentional.    Adin Hector, MD, FACS, MASCRS Esophageal, Gastrointestinal & Colorectal Surgery Robotic and Minimally Invasive Surgery  [facility]. [address], [place], [place] [address] [phone] Fax [phone] Main  CONTACT INFORMATION:  Weekday (9AM-5PM): Call [facility] main office at [phone]  Weeknight (5PM-9AM) or Weekend/Holiday: Check [email or web address] (password " [email or web address]") for General Surgery [facility]  coverage  (Please, do not use SecureChat as it is not reliable communication to reach operating surgeons for immediate patient care given surgeries/outpatient duties/clinic/cross-coverage/off post-call which would lead to a delay in care.  Epic staff messaging available for outptient concerns, but may not be answered for 48 hours or more).     11/29/2021  8:56 AM

## 2021-11-29 NOTE — Discharge Instructions (Signed)
EATING AFTER A SMALL BOWEL OBSTRUCTION   EAT START WITH PUREED OR SOFT FOODS Gradually transition to a high fiber diet with a fiber supplement over the next few days after discharge  WALK Walk an hour a day.  Control your pain to do that.    CONTROL PAIN Control pain so that you can walk, sleep, tolerate sneezing/coughing, go up/down stairs.  HAVE A BOWEL MOVEMENT DAILY Keep your bowels regular to avoid problems.  OK to try a laxative to override constipation.  OK to use an antidairrheal to slow down diarrhea.  Call if not better after 2 tries  CALL IF YOU HAVE PROBLEMS/CONCERNS Call if you are still struggling despite following these instructions. Call if you have concerns not answered by these instructions     After your attack of SMALL BOWEL OBSTRUCTION, expect some issues over the next few weeks.    To help you through this temporary phase, we start you out on a pureed (blenderized) diet.  Your first meal in the hospital was thin liquids.  You should have been given a pureed diet by the time you left the hospital.  We ask patients to stay on a pureed diet for the first few days to avoid anything getting "stuck."  Don't be alarmed if your ability to swallow doesn't progress according to this plan.  Everyone is different and some diets can advance more or less quickly.     Some BASIC RULES to follow are: Maintain an upright position whenever eating or drinking. Take small bites - just a teaspoon size bite at a time. Eat slowly.  It may also help to eat only one food at a time. Consider nibbling through smaller, more frequent meals & avoid the urge to eat BIG meals Do not push through feelings of fullness, nausea, or bloatedness Do not mix solid foods and liquids in the same mouthful Try not to "wash foods down" with large gulps of liquids. Avoid carbonated (bubbly/fizzy) drinks.   Avoid foods that make you feel gassy or bloated.  Start with bland foods first.  Wait on trying  greasy, fried, or spicy meals until you are tolerating more bland solids well. Expect to be more gassy/flatulent/bloated initially.  Walking will help your body manage it better. Consider using medications for bloating that contain simethicone such as  Maalox or Gas-X  Eat in a relaxed atmosphere & minimize distractions. Avoid talking while eating.   Do not use straws. Following each meal, sit in an upright position (90 degree angle) for 60 to 90 minutes.  Going for a short walk can help as well If food does stick, don't panic.  Try to relax and let the food pass on its own.  Sipping WARM LIQUID such as strong hot black tea can also help slide it down.   Be gradual in changes & use common sense:  -If you easily tolerating a certain "level" of foods, advance to the next level gradually -If you are having trouble swallowing a particular food, then avoid it.   -If food is sticking when you advance your diet, go back to thinner previous diet (the lower LEVEL) for 1-2 days.  LEVEL 1 = PUREED DIET  Do for the first FEW DAYS AFTER LEAVING THE HOSPITAL  -Foods in this group are pureed or blenderized to a smooth, mashed potato-like consistency.  -If necessary, the pureed foods can keep their shape with the addition of a thickening agent.   -Meat should be pureed to a   smooth, pasty consistency.  Hot broth or gravy may be added to the pureed meat, approximately 1 oz. of liquid per 3 oz. serving of meat. -CAUTION:  If any foods do not puree into a smooth consistency, swallowing will be more difficult.  (For example, nuts or seeds sometimes do not blend well.)  Hot Foods Cold Foods  Pureed scrambled eggs and cheese Pureed cottage cheese  Baby cereals Thickened juices and nectars  Thinned cooked cereals (no lumps) Thickened milk or eggnog  Pureed French toast or pancakes Ensure  Mashed potatoes Ice cream  Pureed parsley, au gratin, scalloped potatoes, candied sweet potatoes Fruit or Italian ice,  sherbet  Pureed buttered or alfredo noodles Plain yogurt  Pureed vegetables (no corn or peas) Instant breakfast  Pureed soups and creamed soups Smooth pudding, mousse, custard  Pureed scalloped apples Whipped gelatin  Gravies Sugar, syrup, honey, jelly  Sauces, cheese, tomato, barbecue, white, creamed Cream  Any baby food Creamer  Alcohol in moderation (not beer or champagne) Margarine  Coffee or tea Mayonnaise   Ketchup, mustard   Apple sauce   SAMPLE MENU:  PUREED DIET Breakfast Lunch Dinner  Orange juice, 1/2 cup Cream of wheat, 1/2 cup Pineapple juice, 1/2 cup Pureed turkey, barley soup, 3/4 cup Pureed Hawaiian chicken, 3 oz  Scrambled eggs, mashed or blended with cheese, 1/2 cup Tea or coffee, 1 cup  Whole milk, 1 cup  Non-dairy creamer, 2 Tbsp. Mashed potatoes, 1/2 cup Pureed cooled broccoli, 1/2 cup Apple sauce, 1/2 cup Coffee or tea Mashed potatoes, 1/2 cup Pureed spinach, 1/2 cup Frozen yogurt, 1/2 cup Tea or coffee      LEVEL 2 = SOFT DIET  After your first few days, you can advance to a soft, low residue diet.   Keep on this diet until everything goes down easily.  Hot Foods Cold Foods  White fish Cottage cheese  Stuffed fish Junior baby fruit  Baby food meals Semi thickened juices  Minced soft cooked, scrambled, poached eggs nectars  Souffle & omelets Ripe mashed bananas  Cooked cereals Canned fruit, pineapple sauce, milk  potatoes Milkshake  Buttered or Alfredo noodles Custard  Cooked cooled vegetable Puddings, including tapioca  Sherbet Yogurt  Vegetable soup or alphabet soup Fruit ice, Italian ice  Gravies Whipped gelatin  Sugar, syrup, honey, jelly Junior baby desserts  Sauces:  Cheese, creamed, barbecue, tomato, white Cream  Coffee or tea Margarine   SAMPLE MENU:  LEVEL 2 Breakfast Lunch Dinner  Orange juice, 1/2 cup Oatmeal, 1/2 cup Scrambled eggs with cheese, 1/2 cup Decaffeinated tea, 1 cup Whole milk, 1 cup Non-dairy creamer, 2 Tbsp  Pineapple juice, 1/2 cup Minced beef, 3 oz Gravy, 2 Tbsp Mashed potatoes, 1/2 cup Minced fresh broccoli, 1/2 cup Applesauce, 1/2 cup Coffee, 1 cup Turkey, barley soup, 3/4 cup Minced Hawaiian chicken, 3 oz Mashed potatoes, 1/2 cup Cooked spinach, 1/2 cup Frozen yogurt, 1/2 cup Non-dairy creamer, 2 Tbsp      LEVEL 3 = CHOPPED DIET  -After all the foods in level 2 (soft diet) are passing through well you should advance up to more chopped foods.  -It is still important to cut these foods into small pieces and eat slowly.  Hot Foods Cold Foods  Poultry Cottage cheese  Chopped Swedish meatballs Yogurt  Meat salads (ground or flaked meat) Milk  Flaked fish (tuna) Milkshakes  Poached or scrambled eggs Soft, cold, dry cereal  Souffles and omelets Fruit juices or nectars  Cooked cereals Chopped canned   fruit  Chopped French toast or pancakes Canned fruit cocktail  Noodles or pasta (no rice) Pudding, mousse, custard  Cooked vegetables (no frozen peas, corn, or mixed vegetables) Green salad  Canned small sweet peas Ice cream  Creamed soup or vegetable soup Fruit ice, Italian ice  Pureed vegetable soup or alphabet soup Non-dairy creamer  Ground scalloped apples Margarine  Gravies Mayonnaise  Sauces:  Cheese, creamed, barbecue, tomato, white Ketchup  Coffee or tea Mustard   SAMPLE MENU:  LEVEL 3 Breakfast Lunch Dinner  Orange juice, 1/2 cup Oatmeal, 1/2 cup Scrambled eggs with cheese, 1/2 cup Decaffeinated tea, 1 cup Whole milk, 1 cup Non-dairy creamer, 2 Tbsp Ketchup, 1 Tbsp Margarine, 1 tsp Salt, 1/4 tsp Sugar, 2 tsp Pineapple juice, 1/2 cup Ground beef, 3 oz Gravy, 2 Tbsp Mashed potatoes, 1/2 cup Cooked spinach, 1/2 cup Applesauce, 1/2 cup Decaffeinated coffee Whole milk Non-dairy creamer, 2 Tbsp Margarine, 1 tsp Salt, 1/4 tsp Pureed turkey, barley soup, 3/4 cup Barbecue chicken, 3 oz Mashed potatoes, 1/2 cup Ground fresh broccoli, 1/2 cup Frozen yogurt, 1/2  cup Decaffeinated tea, 1 cup Non-dairy creamer, 2 Tbsp Margarine, 1 tsp Salt, 1/4 tsp Sugar, 1 tsp    LEVEL 4:  HIGH FIBER DIET / REGULAR FOODS  -Foods in this group are soft, moist, regularly textured foods.   -This level includes meat and breads, which tend to be the hardest things to swallow.   -Eat very slowly, chew well and continue to avoid carbonated drinks. -most people are at this level in 2-4 weeks  Hot Foods Cold Foods  Baked fish or skinned Soft cheeses - cottage cheese  Souffles and omelets Cream cheese  Eggs Yogurt  Stuffed shells Milk  Spaghetti with meat sauce Milkshakes  Cooked cereal Cold dry cereals (no nuts, dried fruit, coconut)  French toast or pancakes Crackers  Buttered toast Fruit juices or nectars  Noodles or pasta (no rice) Canned fruit  Potatoes (all types) Ripe bananas  Soft, cooked vegetables (no corn, lima, or baked beans) Peeled, ripe, fresh fruit  Creamed soups or vegetable soup Cakes (no nuts, dried fruit, coconut)  Canned chicken noodle soup Plain doughnuts  Gravies Ice cream  Bacon dressing Pudding, mousse, custard  Sauces:  Cheese, creamed, barbecue, tomato, white Fruit ice, Italian ice, sherbet  Decaffeinated tea or coffee Whipped gelatin  Pork chops Regular gelatin   Canned fruited gelatin molds   Sugar, syrup, honey, jam, jelly   Cream   Non-dairy   Margarine   Oil   Mayonnaise   Ketchup   Mustard   TROUBLESHOOTING IRREGULAR BOWELS  1) Avoid extremes of bowel movements (no bad constipation/diarrhea)  2) Miralax 17gm mixed in 8oz. water or juice-daily. May use BID as needed.  3) Gas-x,Phazyme, etc. as needed for gas & bloating.  4) Soft,bland diet. No spicy,greasy,fried foods.  5) Prilosec over-the-counter as needed  6) May hold gluten/wheat products from diet to see if symptoms improve.  7) May try probiotics (Align, Activa, etc) to help calm the bowels down  7) If symptoms become worse call back immediately.    If you  have any questions please call our office at [facility]: [phone].     This information is not intended to replace advice given to you by your health care provider. Make sure you discuss any questions you have with your health care provider.      Bowel Obstruction A bowel obstruction is a blockage in the small or large bowel. The   bowel, which is also called the intestine, is a long, slender tube that connects the stomach to the anus. When a person eats and drinks, food and fluids go from the mouth to the stomach to the small bowel. This is where most of the nutrients in the food and fluids are absorbed. After the small bowel, material passes through the large bowel for further absorption until any leftover material leaves the body as stool through the anus during a bowel movement. A bowel obstruction will prevent food and fluids from passing through the bowel as they normally do during digestion. The bowel can become partially or completely blocked. If this condition is not treated, it can be dangerous because the bowel could rupture. What are the causes? Common causes of this condition include: Scar tissue (adhesions) from previous surgery or treatment with high-energy X-rays (radiation). Recent surgery. This may cause the movements of the bowel to slow down and cause food to block the intestine. Inflammatory bowel disease, such as Crohn's disease or diverticulitis. Growths or tumors. A bulging organ (hernia). Twisting of the bowel (volvulus). A foreign body. Slipping of a part of the bowel into another part (intussusception). What are the signs or symptoms? Symptoms of this condition include: Pain in the abdomen. Depending on the degree of obstruction, pain may be: Mild or severe. Dull cramping or sharp pain. In one area or in the entire abdomen. Nausea and vomiting. Vomit may be greenish or a yellow bile color. Bloating in the abdomen. Difficulty passing stool  (constipation). Lack of passing gas. Frequent belching. Diarrhea. This may occur if the obstruction is partial and runny stool is able to leak around the obstruction. How is this diagnosed? This condition may be diagnosed based on: A physical exam. Medical history. Imaging tests of the abdomen or pelvis, such as X-ray or CT scan. Blood or urine tests. How is this treated? Treatment for this condition depends on the cause and severity of the problem. Treatment may include: Fluids and pain medicines that are given through an IV. Your health care provider may instruct you not to eat or drink if you have nausea or vomiting. Eating a simple diet. You may be asked to consume a clear liquid diet for several days. This allows the bowel to rest. Placement of a small tube (nasogastric tube) into the stomach. This will relieve pain, discomfort, and nausea by removing blocked air and fluids from the stomach. It can also help the obstruction clear up faster. Surgery. This may be required if other treatments do not work. Surgery may be required for: Bowel obstruction from a hernia. This can be an emergency procedure. Scar tissue that causes frequent or severe obstructions. Follow these instructions at home: Medicines Take over-the-counter and prescription medicines only as told by your health care provider. If you were prescribed an antibiotic medicine, take it as told by your health care provider. Do not stop taking the antibiotic even if you start to feel better. General instructions Follow instructions from your health care provider about eating restrictions. You may need to avoid solid foods and consume only clear liquids until your condition improves. Return to your normal activities as told by your health care provider. Ask your health care provider what activities are safe for you. Avoid sitting for a long time without moving. Get up to take short walks every 1-2 hours. This is important to improve  blood flow and breathing. Ask for help if you feel weak or unsteady. Keep all follow-up visits   as told by your health care provider. This is important. How is this prevented? After having a bowel obstruction, you are more likely to have another. You may do the following things to prevent another obstruction: If you have a long-term (chronic) disease, pay attention to your symptoms and contact your health care provider if you have questions or concerns. Avoid becoming constipated. To prevent or treat constipation, your health care provider may recommend that you: Drink enough fluid to keep your urine pale yellow. Take over-the-counter or prescription medicines. Eat foods that are high in fiber, such as beans, whole grains, and fresh fruits and vegetables. Limit foods that are high in fat and processed sugars, such as fried or sweet foods. Stay active. Exercise for 30 minutes or more, 5 or more days each week. Ask your health care provider which exercises are safe for you. Avoid stress. Find ways to reduce stress, such as meditation, exercise, or taking time for activities that relax you. Instead of eating three large meals each day, eat three small meals with three small snacks. Work with a dietitian to make a healthy meal plan that works for you. Do not use any products that contain nicotine or tobacco, such as cigarettes and e-cigarettes. If you need help quitting, ask your health care provider. Contact a health care provider if you: Have a fever. Have chills. Get help right away if you: Have increased pain or cramping. Vomit blood. Have uncontrolled vomiting or nausea. Cannot drink fluids because of vomiting or pain. Become confused. Begin feeling very thirsty (dehydrated). Have severe bloating. Feel extremely weak or you faint. Summary A bowel obstruction is a blockage in the small or large bowel. A bowel obstruction will prevent food and fluids from passing through the bowel as they  normally do during digestion. Treatment for this condition depends on the cause and severity of the problem. It may include fluids and pain medicines through an IV, a simple diet, a nasogastric tube, or surgery. Follow instructions from your health care provider about eating restrictions. You may need to avoid solid foods and consume only clear liquids until your condition improves.   

## 2021-11-29 NOTE — Progress Notes (Signed)
HD#1 Subjective:   Summary: 61 year old female with COPD, hypertension, GERD, osteoarthritis, history of hysterectomy and multiple recurrent SBO's who presents with left lower quadrant abdominal pain and vomiting.  Overnight Events: NAEO.  Patient has been tolerating her diet well. Endorses 2 bowel movements today. No nausea or emesis. Denies any abdominal pain at this time.  Objective:  Vital signs in last 24 hours: Vitals:   11/28/21 2022 11/28/21 2055 11/29/21 0448 11/29/21 0500  BP: (!) 167/89 (!) 149/76 (!) 146/78   Pulse: (!) 102 100 69   Resp: 17 20 19    Temp: 98.4 F (36.9 C) 98.6 F (37 C) 98 F (36.7 C)   TempSrc: Oral Oral Oral   SpO2: 100% 100% 99%   Weight:    81.3 kg   Supplemental O2: Room Air SpO2: 99 %   Physical Exam:  Constitutional: well-appearing female sitting in hospital bed, in no acute distress HENT: normocephalic atraumatic, mucous membranes moist Eyes: conjunctiva non-erythematous Neck: supple Pulmonary/Chest: normal work of breathing on room air Abdominal: soft, non-tender, non-distended, normal bowel sounds MSK: normal bulk and tone Neurological: alert & oriented x 3 Skin: warm and dry Psych: Normal mood and affect.  Filed Weights   11/28/21 0500 11/29/21 0500  Weight: 81.5 kg 81.3 kg     Intake/Output Summary (Last 24 hours) at 11/29/2021 0649 Last data filed at 11/28/2021 2048 Gross per 24 hour  Intake 3 ml  Output --  Net 3 ml    Net IO Since Admission: 1,355.73 mL [11/29/21 0649]  Pertinent Labs:    Latest Ref Rng & Units 11/29/2021    2:55 AM 11/28/2021    1:59 AM 11/27/2021    4:46 AM  CBC  WBC 4.0 - 10.5 K/uL 4.3  4.7  6.0   Hemoglobin 12.0 - 15.0 g/dL 11.4  11.9  13.4   Hematocrit 36.0 - 46.0 % 34.0  35.7  40.8   Platelets 150 - 400 K/uL 178  196  268        Latest Ref Rng & Units 11/29/2021    2:55 AM 11/28/2021    1:59 AM 11/27/2021    4:46 AM  CMP  Glucose 70 - 99 mg/dL 87  82  105   BUN 6 - 20 mg/dL 6   8  14    Creatinine 0.44 - 1.00 mg/dL 0.64  0.79  0.76   Sodium 135 - 145 mmol/L 138  139  139   Potassium 3.5 - 5.1 mmol/L 3.5  3.8  3.8   Chloride 98 - 111 mmol/L 107  108  103   CO2 22 - 32 mmol/L 24  22  25    Calcium 8.9 - 10.3 mg/dL 8.6  8.9  10.1   Total Protein 6.5 - 8.1 g/dL   6.8   Total Bilirubin 0.3 - 1.2 mg/dL   0.7   Alkaline Phos 38 - 126 U/L   77   AST 15 - 41 U/L   33   ALT 0 - 44 U/L   25     Imaging: DG Abd Portable 1V  Result Date: 11/28/2021 CLINICAL DATA:  Abdominal distension. History of small-bowel obstruction. EXAM: PORTABLE ABDOMEN - 1 VIEW COMPARISON:  11/27/2021 and older exams. FINDINGS: Decreased degree of bowel dilation compared to the previous day's study. Contrast is noted within loops of bowel in the right and left abdomen and in the pelvis, consistent with colon. IMPRESSION: 1. Interval improvement. Significant decrease in bowel dilation compared  to the previous day's study. 2. Contrast visualized in the colon. Electronically Signed   By: Amie Portland M.D.   On: 11/28/2021 11:15    Assessment/Plan:   Principal Problem:   Small bowel obstruction (HCC) Active Problems:   COPD (chronic obstructive pulmonary disease) (HCC)   Hypertension   GERD (gastroesophageal reflux disease)   Osteoarthritis   Asthma   Chronic low back pain   Patient Summary: Mechanical SBO likely 2/2 adhesions Hx of SBO Hx of multiple prior abdominal surgeries requiring partial small bowel resection Labs and vitals stable. Advancing to soft diet today. Patient with much improvement in symptoms on rounds this morning. Denies nausea, vomiting, abdominal pain. Discharge this afternoon if able to tolerate po diet. -appreciate surgery recommendations -SBO protocol -dilaudid 0.5mg  q4 prn for severe pain, dilaudid 1mg  q4 prn for breakthrough pain (can transition to PO pain management once diet is started) -IV compazine prn for N/V -lovenox for dvt ppx -trend CBC   Incidental  finding of tubular mass-like density in RLL 1.4cm tubular mass-like density in the RLL, most likely mucus plugging vs pulmonary pseudoaneurysm. Low concern for neoplastic mass. Per radiology, recommend f/u with CT chest angiogram in 3 months for further characterization and to ensure stability. -CT chest angiogram in 3 months as outpatient   Chronic low back pain 2/2 OA Long-standing low back pain 2/2 osteoarthritis. She manages this with tylenol at home.  -pain management as above   COPD Asthma Doing well from a respiratory perspective at this time. On home COPD regimen. -continue albuterol inhaler q6 prn -breo ellipta daily -ipratropium qid -spiriva daily   GERD Will restart home omeprazole 40mg  po daily at discharge.   HTN On norvasc 10mg  daily, lisinopril 20mg  daily, and triamterene-HCTZ 37.5-25mg  daily at home. Had been holding while NPO. Blood pressures 140s systolic today. -restart at discharge   Depression Anxiety On zoloft 100mg  daily and trazodone 100mg  qhs prn for sleep. -will restart zoloft at discharge   Dispo: Admit patient to Observation with expected length of stay less than 2 midnights.  MD Internal Medicine Resident PGY-1 Please contact the on call pager after 5 pm and on weekends at [phone].

## 2021-11-29 NOTE — Plan of Care (Signed)

## 2021-11-29 NOTE — Discharge Summary (Signed)
   Name: [name] MRN: [id number] DOB: [birth year] 61 y.o. PCP: Pcp, No  Date of Admission: 11/27/2021  4:30 AM Date of Discharge: No discharge date for patient encounter. Attending Physician: Lucious Groves, DO  Discharge Diagnosis: 1. Principal Problem:   Small bowel obstruction (HCC) Active Problems:   COPD (chronic obstructive pulmonary disease) (HCC)   Hypertension   GERD (gastroesophageal reflux disease)   Osteoarthritis   Asthma   Chronic low back pain   Nausea vomiting and diarrhea  ***  Discharge Medications: Allergies as of 11/29/2021       Reactions   Bee Venom Anaphylaxis, Itching   Peanuts [peanut Oil] Anaphylaxis, Itching   Shellfish Allergy Anaphylaxis, Itching   Aspirin Itching   Corn-containing Products Itching   Ibuprofen Itching   Latex Itching   Morphine And Related Itching   Zofran [ondansetron Hcl] Itching, Rash     Med Rec must be completed prior to using this [place]***       Disposition and follow-up:   [name] was discharged from [facility] in Good condition.  At the hospital follow up visit please address:  1.  Partial SBO: Symptoms much improved after bowel rest and fluids. Able to tolerate diet by discharge. Please follow up symptoms to ensure of nausea, vomiting, and abdominal pain have not returned.   2.  Labs / imaging needed at time of follow-up: none  3.  Pending labs/ test needing follow-up: none  Follow-up Appointments:   Hospital Course by problem list: 1. Partial SBO: Patient presented with reports of 2-3 days of abdominal pain at LLQ and inability to eat without vomiting and liquids stools. Patient has a history of multiple prior abdominal surgeries including an abdominal hysterectomy in 3500 complicated by small bowel prolapse through vagina requiring an ex-lap with partial small bowel resection. She also has a history of SBO in 2002 requiring ex-lap with small bowel resection x2  complicated by abdominal compartment syndrome requiring laparotomy. Found to have a SBO on CT imaging with a transition point likely in the LLQ. Passing flatus and clinically stable without fever, tachycardia, leukocytosis, or peritoneal signs. Surgery recommended NGT decompression and SBO protocol. Patient refused NGT but nonetheless improve but HD2. Advanced to softs by discharge without nausea, emesis, or abdominal pain.  ***  Discharge Exam:   BP (!) 140/64 (BP Location: Left Arm)   Pulse 68   Temp 98.2 F (36.8 C) (Oral)   Resp 16   Wt 81.3 kg   SpO2 98%   BMI 32.78 kg/m  Discharge exam: ***  Pertinent Labs, Studies, and Procedures:  ***  Discharge Instructions:   Signed: Linward Natal, MD 11/29/2021, 12:30 PM   Pager: [phone]

## 2021-11-30 LAB — BASIC METABOLIC PANEL
Anion gap: 9 (ref 5–15)
BUN: 7 mg/dL (ref 6–20)
CO2: 27 mmol/L (ref 22–32)
Calcium: 9.2 mg/dL (ref 8.9–10.3)
Chloride: 104 mmol/L (ref 98–111)
Creatinine, Ser: 0.73 mg/dL (ref 0.44–1.00)
GFR, Estimated: >60 mL/min (ref >60)
Glucose, Bld: 108 mg/dL — ABNORMAL HIGH (ref 70–99)
Potassium: 3.5 mmol/L (ref 3.5–5.1)
Sodium: 140 mmol/L (ref 135–145)

## 2021-11-30 LAB — CBC
HCT: 34 % — ABNORMAL LOW (ref 36.0–46.0)
Hemoglobin: 11.4 g/dL — ABNORMAL LOW (ref 12.0–15.0)
MCH: 29.5 pg (ref 26.0–34.0)
MCHC: 33.5 g/dL (ref 30.0–36.0)
MCV: 87.9 fL (ref 80.0–100.0)
Platelets: 177 10*3/uL (ref 150–400)
RBC: 3.87 MIL/uL (ref 3.87–5.11)
RDW: 13.2 % (ref 11.5–15.5)
WBC: 4.5 10*3/uL (ref 4.0–10.5)
nRBC: 0 % (ref 0.0–0.2)

## 2021-11-30 MED ORDER — AMLODIPINE BESYLATE 10 MG PO TABS
10.0000 mg | ORAL_TABLET | Freq: Every day | ORAL | Status: DC
  Administered 2021-11-30 – 2021-12-01 (×2): 10 mg via ORAL
  Filled 2021-11-30 (×2): qty 1

## 2021-11-30 NOTE — Progress Notes (Signed)
Patient was re-evaluated after eating lunch/dinner. She states that she continues to have some nausea with eating and she noted that she only ate a few small bites of her meal.   She will stay one more night to see if her nausea improves and her oral intake improves.

## 2021-11-30 NOTE — Progress Notes (Addendum)
[facility] Progress Note     Subjective: CC:  Overall feels better. Decreased abd distention. Tolerating PO. Endorses nausea, controlled with medications, no vomiting. She is having flatus and reports another watery BM at 0300. Feels hungry.  Objective: Vital signs in last 24 hours: Temp:  [97.8 F (36.6 C)-98.4 F (36.9 C)] 97.8 F (36.6 C) (10/09 0757) Pulse Rate:  [66-87] 76 (10/09 0757) Resp:  [16-17] 16 (10/09 0757) BP: (138-152)/(74-85) 152/85 (10/09 0757) SpO2:  [100 %] 100 % (10/09 0757) Weight:  [80 kg] 80 kg (10/09 0343) Last BM Date : 11/28/21  Intake/Output from previous day: No intake/output data recorded. Intake/Output this shift: No intake/output data recorded.  PE: Gen:  Alert, NAD, pleasant and cooperative, walking in her room Pulm:  Normal effort Abd: Soft, non-tender, mild upper abd distention - improved compared to prior.  Skin: warm and dry, no rashes  Psych: A&Ox3   Lab Results:  Recent Labs    11/29/21 0255 11/30/21 0042  WBC 4.3 4.5  HGB 11.4* 11.4*  HCT 34.0* 34.0*  PLT 178 177   BMET Recent Labs    11/29/21 0255 11/30/21 0042  NA 138 140  K 3.5 3.5  CL 107 104  CO2 24 27  GLUCOSE 87 108*  BUN 6 7  CREATININE 0.64 0.73  CALCIUM 8.6* 9.2   PT/INR Recent Labs    11/27/21 1334 11/28/21 0159  LABPROT 17.6* 17.5*  INR 1.5* 1.5*   CMP     Component Value Date/Time   NA 140 11/30/2021 0042   K 3.5 11/30/2021 0042   CL 104 11/30/2021 0042   CO2 27 11/30/2021 0042   GLUCOSE 108 (H) 11/30/2021 0042   BUN 7 11/30/2021 0042   CREATININE 0.73 11/30/2021 0042   CALCIUM 9.2 11/30/2021 0042   PROT 6.8 11/27/2021 0446   ALBUMIN 4.1 11/27/2021 0446   AST 33 11/27/2021 0446   ALT 25 11/27/2021 0446   ALKPHOS 77 11/27/2021 0446   BILITOT 0.7 11/27/2021 0446   GFRNONAA >60 11/30/2021 0042   GFRAA >60 02/27/2016 1811   Lipase     Component Value Date/Time   LIPASE 28 11/27/2021 0446        Studies/Results: DG Abd Portable 1V  Result Date: 11/28/2021 CLINICAL DATA:  Abdominal distension. History of small-bowel obstruction. EXAM: PORTABLE ABDOMEN - 1 VIEW COMPARISON:  11/27/2021 and older exams. FINDINGS: Decreased degree of bowel dilation compared to the previous day's study. Contrast is noted within loops of bowel in the right and left abdomen and in the pelvis, consistent with colon. IMPRESSION: 1. Interval improvement. Significant decrease in bowel dilation compared to the previous day's study. 2. Contrast visualized in the colon. Electronically Signed   By: Lajean Manes M.D.   On: 11/28/2021 11:15    Anti-infectives: Anti-infectives (From admission, onward)    None        Assessment/Plan  pSBO in the setting of numerous prior abdominal surgeries.  Most likely due to adhesions. - CT 10/6 c/w dilated loops of bowel in the RUQ - appear chronic, but increased compared to scan in 2018. New dilated loops in LLQ with transition zone.  -Clinically passing gas with loose bowel movements.  X-ray 10/7 shows contrast in colon.    - tolerating soft diet. No acute surgical needs. Stable for D/C from [facility] standpoint if tolerates SOFT diet for breakfast and lunch.    LOS: 2 days   I reviewed nursing notes, hospitalist notes, last 24 h  vitals and pain scores, last 48 h intake and output, last 24 h labs and trends, and last 24 h imaging results.   Obie Dredge, PA-C [facility] Please see Amion for pager number during day hours 7:00am-4:30pm   I personally saw the patient and performed a substantive portion of this encounter, including a complete performance of at least one of the key components (MDM, Hx and/or Exam), in conjunction with the Advanced Practice Provider Obie Dredge, PA-C.  Bowel function resumed.  May be discharged per primary team. Will sign off.  Imogene Burn. Georgette Dover, MD, [facility]  General  Surgery   11/30/2021 11:34 AM

## 2021-11-30 NOTE — TOC Initial Note (Addendum)
Transition of Care [facility]) - Initial/Assessment Note    Patient Details  Name: [name] MRN: [id number] Date of Birth: 1961-01-05  Transition [facility] CM/SW Contact:    Marilu Favre, RN Phone Number: 11/30/2021, 1:59 PM  Clinical Narrative:                 Patient from home. Has PCP . Plotnikov  Patient has medicaid   She will ask her daughter to brought Medicaid card in this evening.   Copy will need to be sent to admitting phone [phone] fax [phone]   Expected Discharge Plan: Home/Self Care Barriers to Discharge: Continued Medical Work up   Patient Goals and CMS Choice Patient states their goals for this hospitalization and ongoing recovery are:: to return to home   Choice offered to / list presented to : Patient  Expected Discharge Plan and Services Expected Discharge Plan: Home/Self Care   Discharge Planning Services: CM Consult Post Acute Care Choice: NA Living arrangements for the past 2 months: Single Family Home                 DME Arranged: N/A         HH Arranged: NA          Prior Living Arrangements/Services Living arrangements for the past 2 months: Single Family Home Lives with:: Adult Children Patient language and need for interpreter reviewed:: Yes Do you feel safe going back to the place where you live?: Yes      Need for Family Participation in Patient Care: Yes (Comment) Care giver support system in place?: Yes (comment)   Criminal Activity/Legal Involvement Pertinent to Current Situation/Hospitalization: No - Comment as needed  Activities of Daily Living      Permission Sought/Granted   Permission granted to share information with : No              Emotional Assessment Appearance:: Appears stated age Attitude/Demeanor/Rapport: Engaged Affect (typically observed): Accepting Orientation: : Oriented to Self, Oriented to Place, Oriented to  Time, Oriented to Situation   Psych Involvement: No  (comment)  Admission diagnosis:  Small bowel obstruction (HCC) [K56.609] Nausea vomiting and diarrhea [R11.2, R19.7] Patient Active Problem List   Diagnosis Date Noted   Nausea vomiting and diarrhea    Heart murmur 11/27/2021   Chronic low back pain 11/27/2021   Encounter for imaging study to confirm nasogastric (NG) tube placement    Choledocholithiasis    Calculus of gallbladder with biliary obstruction but without cholecystitis    Small bowel obstruction (HCC)    Essential hypertension    Gastroesophageal reflux disease with esophagitis    Simple chronic bronchitis (HCC)    SBO (small bowel obstruction) ([place]) 08/14/2015   Hypokalemia 08/14/2015   Partial small bowel obstruction ([place]) 03/22/2014   Abdominal pain 03/22/2014   COPD (chronic obstructive pulmonary disease) ([place])    Hypertension    GERD (gastroesophageal reflux disease)    Hyperlipidemia    Osteoarthritis    Asthma 06/03/2011   Family history of cardiovascular disease 06/03/2011   Depressive disorder 06/03/2011   PCP:  Pcp, No Pharmacy:   [id number] [address] [place] [address] Phone: [phone] Fax: [phone]  [facility] [address] [place] [address] Phone: [phone] Fax: [phone]     Social Determinants of Health (SDOH) Interventions    Readmission Risk Interventions  No data to display

## 2021-12-01 DIAGNOSIS — K56600 Partial intestinal obstruction, unspecified as to cause: Secondary | ICD-10-CM

## 2021-12-01 MED ORDER — IPRATROPIUM BROMIDE 0.06 % NA SOLN: 2.0000 | Freq: Four times a day (QID) | NASAL | 12 refills | Status: DC

## 2021-12-02 NOTE — Progress Notes (Signed)
Patient daughter here to pick up patient. Per patient she has all paperwork and did not have any questions.

## 2022-01-10 ENCOUNTER — Encounter (HOSPITAL_COMMUNITY): Payer: Self-pay

## 2022-01-10 ENCOUNTER — Emergency Department (HOSPITAL_COMMUNITY)
Admission: EM | Admit: 2022-01-10 | Discharge: 2022-01-10 | Disposition: A | Discharge status: Home / Self Care | Payer: Self-pay | Attending: Emergency Medicine | Admitting: Emergency Medicine

## 2022-01-10 ENCOUNTER — Emergency Department (HOSPITAL_COMMUNITY): Payer: Self-pay

## 2022-01-10 ENCOUNTER — Other Ambulatory Visit: Payer: Self-pay

## 2022-01-10 DIAGNOSIS — Z91010 Allergy to peanuts: Secondary | ICD-10-CM | POA: Insufficient documentation

## 2022-01-10 DIAGNOSIS — Z91040 Latex allergy status: Secondary | ICD-10-CM | POA: Insufficient documentation

## 2022-01-10 DIAGNOSIS — M1710 Unilateral primary osteoarthritis, unspecified knee: Secondary | ICD-10-CM

## 2022-01-10 DIAGNOSIS — M1712 Unilateral primary osteoarthritis, left knee: Secondary | ICD-10-CM | POA: Insufficient documentation

## 2022-01-10 DIAGNOSIS — M25562 Pain in left knee: Secondary | ICD-10-CM

## 2022-01-10 MED ORDER — HYDROCODONE-ACETAMINOPHEN 5-325 MG PO TABS
1.0000 | ORAL_TABLET | Freq: Once | ORAL | Status: AC
  Administered 2022-01-10: 1 via ORAL
  Filled 2022-01-10: qty 1

## 2022-01-10 MED ORDER — HYDROCODONE-ACETAMINOPHEN 5-325 MG PO TABS: 1.0000 | ORAL_TABLET | Freq: Three times a day (TID) | ORAL | 0 refills | Status: DC | PRN

## 2022-01-10 NOTE — ED Notes (Signed)
Pt said to be in RR by visitor in the lobby, will recall later.

## 2022-01-10 NOTE — ED Provider Notes (Addendum)
[name] Provider Note   CSN: [id number] Arrival date & time: 01/10/22  1423     History  Chief Complaint  Patient presents with   Knee Pain    [name] is a 61 y.o. female.  HPI    61 year old female comes in with chief complaint of knee pain.  Patient complains of acute left knee pain, that started yesterday.  She has known history of arthritis in her left knee, but has been trying to avoid surgical repair or any shots to her knee for now.  She was tolerating the discomfort well until yesterday, when the knee gave out on 2 separate occasions leading her to fall on one of the occasions.  She is having pain with weightbearing.  Patient has taken extra strength Tylenol at home without significant relief.  Patient's previous knee replacement was done at [name], she prefers following up with [name] if possible.  Home Medications Prior to Admission medications   Medication Sig Start Date End Date Taking? Authorizing Provider  HYDROcodone-acetaminophen (NORCO/VICODIN) 5-325 MG tablet Take 1 tablet by mouth every 8 (eight) hours as needed. 01/10/22  Yes Derwood Kaplan, MD  albuterol (PROVENTIL HFA;VENTOLIN HFA) 108 (90 BASE) MCG/ACT inhaler Inhale 1 puff into the lungs every 6 (six) hours as needed for wheezing or shortness of breath (wheezing).     [provider]  amLODipine (NORVASC) 10 MG tablet Take 10 mg by mouth daily.    [provider]  bisacodyl (DULCOLAX) 10 MG suppository Place 1 suppository (10 mg total) rectally daily as needed for moderate constipation. Patient not taking: Reported on 02/27/2016 08/03/14   Zoe Overlie, MD  diclofenac sodium (VOLTAREN) 1 % GEL Apply 2 g topically 4 (four) times daily as needed (knee pain).     [provider]  diphenhydrAMINE (BENADRYL) 25 MG tablet Take 25 mg by mouth every 6 (six) hours as needed for allergies (allergies).    [provider]   esomeprazole (NEXIUM) 40 MG capsule Take 40 mg by mouth daily at 12 noon.    [provider]  Fluticasone-Salmeterol (ADVAIR) 500-50 MCG/DOSE AEPB Inhale 1 puff into the lungs 2 (two) times daily.    [provider]  gabapentin (NEURONTIN) 300 MG capsule Take 300 mg by mouth 3 (three) times daily.    [provider]  ipratropium (ATROVENT) 0.06 % nasal spray Place 2 sprays into the nose 4 (four) times daily. 12/01/21   Adron Bene, MD  loratadine (CLARITIN) 10 MG tablet Take 10 mg by mouth daily.    [provider]  Multiple Vitamin (MULTIVITAMIN WITH MINERALS) TABS tablet Take 1 tablet by mouth daily.    [provider]  polyethylene glycol (MIRALAX / GLYCOLAX) packet Take 17 g by mouth daily as needed for mild constipation or moderate constipation (constipation).     [provider]  promethazine (PHENERGAN) 25 MG tablet Take 25 mg by mouth every 8 (eight) hours as needed for nausea or vomiting (nausea).  08/20/13   [provider]  rosuvastatin (CRESTOR) 10 MG tablet Take 10 mg by mouth daily.    [provider]  senna (SENOKOT) 8.6 MG TABS tablet Take 1 tablet (8.6 mg total) by mouth daily. 11/22/15   Zoe Overlie, MD  sertraline (ZOLOFT) 100 MG tablet Take 100 mg by mouth daily.    [provider]  tiotropium (SPIRIVA) 18 MCG inhalation capsule Place 18 mcg into inhaler and inhale daily.  [provider]  traZODone (DESYREL) 100 MG tablet Take 100 mg by mouth at bedtime as needed for sleep.  06/09/14   [provider]      Allergies    Bee venom, Peanuts [peanut oil], Shellfish allergy, Aspirin, Corn-containing products, Ibuprofen, Latex, Morphine and related, and Zofran [ondansetron hcl]    Review of Systems   Review of Systems  Physical Exam Updated Vital Signs BP (!) 143/89   Pulse 79   Temp (!) 97.5 F (36.4 C)   Resp 17   Ht 5\' 2"  (1.575 m)   Wt 79.8 kg   SpO2 97%   BMI  32.19 kg/m  Physical Exam Vitals and nursing note reviewed.  Constitutional:      Appearance: She is well-developed.  HENT:     Head: Atraumatic.  Eyes:     Extraocular Movements: Extraocular movements intact.     Pupils: Pupils are equal, round, and reactive to light.  Cardiovascular:     Rate and Rhythm: Normal rate.  Pulmonary:     Effort: Pulmonary effort is normal.  Musculoskeletal:        General: Swelling and tenderness present.     Cervical back: Normal range of motion and neck supple.     Comments: Patient has monoarticular effusion of the left knee, no warmth to touch.  Patient able to actively flex and extend the knee, but the range of motion is compromised secondary to pain.  Tenderness to palpation all around the patella  Skin:    General: Skin is warm and dry.  Neurological:     Mental Status: She is alert and oriented to person, place, and time.     ED Results / Procedures / Treatments   Labs (all labs ordered are listed, but only abnormal results are displayed) Labs Reviewed - No data to display  EKG None  Radiology DG Knee Complete 4 Views Left  Result Date: 01/10/2022 CLINICAL DATA:  Fall with anterior and posterior left knee pain EXAM: LEFT KNEE - COMPLETE 4 VIEW COMPARISON:  None Available. FINDINGS: No evidence of fracture, dislocation, or joint effusion. Tricompartmental degenerative changes of the knee. Soft tissues are unremarkable. IMPRESSION: 1. No acute displaced fracture or dislocation. 2. Tricompartmental degenerative changes of the knee. Electronically Signed   By: 01/12/2022 M.D.   On: 01/10/2022 15:37    Procedures Procedures    Medications Ordered in ED Medications  HYDROcodone-acetaminophen (NORCO/VICODIN) 5-325 MG per tablet 1 tablet (1 tablet Oral Given 01/10/22 1542)    ED Course/ Medical Decision Making/ A&P Clinical Course as of 01/10/22 1647  Sun Jan 10, 2022  1646 DG Knee Complete 4 Views Left Xray knee interpreted  - +  arthritic changes. No fracture. Will d/c. [AN]    Clinical Course User Index [AN] Jan 12, 2022, MD                           Medical Decision Making 61 year old patient comes in with chief complaint of acute knee pain.  Patient has known history of arthritis of the knee, but the pain is new, and started after her knee gave out yesterday when she was turning.  She is noted to have significant discomfort around the knee along with swelling.  Patient able to flex and extend the knee -which is reassuring.  Differential diagnosis considered includes septic arthritis, gouty arthritis, soft tissue injury to the knee, worsening osteoarthritis  X-ray of the knee ordered.  Crutches will be provided.  Knee immobilizer will be given.  Pain control.  Will provide outpatient orthopedic follow-up.  RICE recommended.  Problems Addressed: Acute pain of left knee: acute illness or injury Arthritis of knee: chronic illness or injury with exacerbation, progression, or side effects of treatment  Amount and/or Complexity of Data Reviewed Radiology: ordered.  Risk Prescription drug management.    Final Clinical Impression(s) / ED Diagnoses Final diagnoses:  Acute pain of left knee  Arthritis of knee    Rx / DC Orders ED Discharge Orders          Ordered    HYDROcodone-acetaminophen (NORCO/VICODIN) 5-325 MG tablet  Every 8 hours PRN        01/10/22 1555              Derwood Kaplan, MD 01/10/22 1521    Derwood Kaplan, MD 01/10/22 1647

## 2022-01-10 NOTE — Discharge Instructions (Addendum)
Please use the knee immobilizer when ambulating.  Try to give your knee rest.  Ice the knee 3-4 times a day for 15 minutes. Use crutches when ambulating for extra support.  Call the orthopedic doctor for nearest appointment.

## 2022-01-10 NOTE — ED Triage Notes (Signed)
Pt arrived POV from home c/o left knee pain. Pt states she has arthritis and her knee is starting to give out and she fell yesterday but she cannot take the pain.

## 2022-03-07 ENCOUNTER — Ambulatory Visit (INDEPENDENT_AMBULATORY_CARE_PROVIDER_SITE_OTHER): Payer: Medicare Other

## 2022-03-07 ENCOUNTER — Encounter (HOSPITAL_COMMUNITY): Payer: Self-pay | Admitting: Emergency Medicine

## 2022-03-07 ENCOUNTER — Ambulatory Visit (HOSPITAL_COMMUNITY)
Admission: EM | Admit: 2022-03-07 | Discharge: 2022-03-07 | Disposition: A | Discharge status: Home / Self Care | Payer: Medicare Other | Attending: Family Medicine | Admitting: Family Medicine

## 2022-03-07 DIAGNOSIS — R079 Chest pain, unspecified: Secondary | ICD-10-CM

## 2022-03-07 DIAGNOSIS — J441 Chronic obstructive pulmonary disease with (acute) exacerbation: Secondary | ICD-10-CM

## 2022-03-07 DIAGNOSIS — Z1152 Encounter for screening for COVID-19: Secondary | ICD-10-CM | POA: Insufficient documentation

## 2022-03-07 DIAGNOSIS — R051 Acute cough: Secondary | ICD-10-CM | POA: Diagnosis present

## 2022-03-07 MED ORDER — ALBUTEROL SULFATE HFA 108 (90 BASE) MCG/ACT IN AERS: 2.0000 | INHALATION_SPRAY | RESPIRATORY_TRACT | 0 refills | Status: DC | PRN

## 2022-03-07 MED ORDER — DOXYCYCLINE HYCLATE 100 MG PO CAPS: 100.0000 mg | ORAL_CAPSULE | Freq: Two times a day (BID) | ORAL | 0 refills | Status: AC

## 2022-03-07 MED ORDER — BENZONATATE 100 MG PO CAPS: 100.0000 mg | ORAL_CAPSULE | Freq: Three times a day (TID) | ORAL | 0 refills | Status: DC | PRN

## 2022-03-07 MED ORDER — PREDNISONE 20 MG PO TABS: 40.0000 mg | ORAL_TABLET | Freq: Every day | ORAL | 0 refills | Status: AC

## 2022-03-07 NOTE — Discharge Instructions (Signed)
Your chest x-ray was clear  Albuterol inhaler--do 2 puffs every 4 hours as needed for shortness of breath or wheezing  Take prednisone 20 mg--2 daily for 5 days  Take doxycycline 100 mg --1 capsule 2 times daily for 7 days  Take benzonatate 100 mg, 1 tab every 8 hours as needed for cough.   You have been swabbed for COVID, and the test will result in the next 24 hours. Our staff will call you if positive. If the COVID test is positive, you should quarantine for 5 days from the start of your symptoms

## 2022-03-07 NOTE — ED Triage Notes (Signed)
Pt reports had dry cough for about 2 weeks. Tried Mucinex. Pain in chest with coughing.

## 2022-03-07 NOTE — ED Provider Notes (Signed)
[facility]    CSN: [id number] Arrival date & time: 03/07/22  1617      History   Chief Complaint Chief Complaint  Patient presents with   Cough    HPI [name] is a 62 y.o. female.    Cough  Here for 2-week history of congestion and cough, but now in the last 2 days she has begun having subjective fever and she is wheezing more.  She has a history of asthma and COPD.  She has had a lot of rhinorrhea and nasal congestion and postnasal drainage.  Past Medical History:  Diagnosis Date   Asthma    Cataract    Chronic bronchitis (HCC)    Chronic lower back pain    COPD (chronic obstructive pulmonary disease) (HCC)    Depression    GERD (gastroesophageal reflux disease)    Heart murmur    History of blood transfusion 2003-2004   "related to bowel obstruction OR"   Hyperlipidemia    Hypertension    Insomnia    Osteoarthritis    "lower back; both hips; right leg" (08/01/2014)   SBO (small bowel obstruction) ([place]) 10/2015    Patient Active Problem List   Diagnosis Date Noted   Nausea vomiting and diarrhea    Heart murmur 11/27/2021   Chronic low back pain 11/27/2021   Encounter for imaging study to confirm nasogastric (NG) tube placement    Choledocholithiasis    Calculus of gallbladder with biliary obstruction but without cholecystitis    Small bowel obstruction (HCC)    Essential hypertension    Gastroesophageal reflux disease with esophagitis    Simple chronic bronchitis (HCC)    SBO (small bowel obstruction) ([place]) 08/14/2015   Hypokalemia 08/14/2015   Partial small bowel obstruction ([place]) 03/22/2014   Abdominal pain 03/22/2014   COPD (chronic obstructive pulmonary disease) ([place])    Hypertension    GERD (gastroesophageal reflux disease)    Hyperlipidemia    Osteoarthritis    Asthma 06/03/2011   Family history of cardiovascular disease 06/03/2011   Depressive disorder 06/03/2011    Past Surgical History:  Procedure Laterality Date    ABDOMINAL EXPLORATION SURGERY  2004   abdominal compartment syndrome   ABDOMINAL EXPLORATION SURGERY  2003   small bowel prolapse through vagina   ABDOMINAL HYSTERECTOMY  2003   ADENOIDECTOMY  2002   BOWEL RESECTION  2003; 2004   x2 with primary anastamosis   COMBINED HYSTEROSCOPY DIAGNOSTIC / D&C  ~ 2003   [place] / UMBILICAL / VENTRAL HERNIA REPAIR  2012   [facility] : [facility], Dr. Abran Richard   TONSILLECTOMY  1977   TOTAL KNEE ARTHROPLASTY Right 03/2012   [facility]    OB History   No obstetric history on file.      Home Medications    Prior to Admission medications   Medication Sig Start Date End Date Taking? Authorizing Provider  albuterol (VENTOLIN HFA) 108 (90 Base) MCG/ACT inhaler Inhale 2 puffs into the lungs every 4 (four) hours as needed for wheezing or shortness of breath. 03/07/22  Yes Leontina Skidmore, Gwenlyn Perking, MD  benzonatate (TESSALON) 100 MG capsule Take 1 capsule (100 mg total) by mouth 3 (three) times daily as needed for cough. 03/07/22  Yes Adanely Reynoso, Gwenlyn Perking, MD  doxycycline (VIBRAMYCIN) 100 MG capsule Take 1 capsule (100 mg total) by mouth 2 (two) times daily for 7 days. 03/07/22 03/14/22 Yes Barrett Henle, MD  predniSONE (DELTASONE) 20 MG tablet Take 2 tablets (40 mg total) by mouth daily with breakfast for 5 days. 03/07/22 03/12/22 Yes Barrett Henle, MD  amLODipine (NORVASC) 10 MG tablet Take 10 mg by mouth daily.    [provider]  bisacodyl (DULCOLAX) 10 MG suppository Place 1 suppository (10 mg total) rectally daily as needed for moderate constipation. Patient not taking: Reported on 02/27/2016 08/03/14   Reyne Dumas, MD  diclofenac sodium (VOLTAREN) 1 % GEL Apply 2 g topically 4 (four) times daily as needed (knee pain).     [provider]  diphenhydrAMINE (BENADRYL) 25 MG tablet Take 25 mg by mouth every 6 (six) hours as needed for allergies (allergies).    [provider]  esomeprazole (NEXIUM) 40  MG capsule Take 40 mg by mouth daily at 12 noon.    [provider]  Fluticasone-Salmeterol (ADVAIR) 500-50 MCG/DOSE AEPB Inhale 1 puff into the lungs 2 (two) times daily.    [provider]  gabapentin (NEURONTIN) 300 MG capsule Take 300 mg by mouth 3 (three) times daily.    [provider]  HYDROcodone-acetaminophen (NORCO/VICODIN) 5-325 MG tablet Take 1 tablet by mouth every 8 (eight) hours as needed. 01/10/22   Varney Biles, MD  ipratropium (ATROVENT) 0.06 % nasal spray Place 2 sprays into the nose 4 (four) times daily. 12/01/21   Linward Natal, MD  loratadine (CLARITIN) 10 MG tablet Take 10 mg by mouth daily.    [provider]  Multiple Vitamin (MULTIVITAMIN WITH MINERALS) TABS tablet Take 1 tablet by mouth daily.    [provider]  polyethylene glycol (MIRALAX / GLYCOLAX) packet Take 17 g by mouth daily as needed for mild constipation or moderate constipation (constipation).     [provider]  promethazine (PHENERGAN) 25 MG tablet Take 25 mg by mouth every 8 (eight) hours as needed for nausea or vomiting (nausea).  08/20/13   [provider]  rosuvastatin (CRESTOR) 10 MG tablet Take 10 mg by mouth daily.    [provider]  senna (SENOKOT) 8.6 MG TABS tablet Take 1 tablet (8.6 mg total) by mouth daily. 11/22/15   Reyne Dumas, MD  sertraline (ZOLOFT) 100 MG tablet Take 100 mg by mouth daily.    [provider]  tiotropium (SPIRIVA) 18 MCG inhalation capsule Place 18 mcg into inhaler and inhale daily.    [provider]  traZODone (DESYREL) 100 MG tablet Take 100 mg by mouth at bedtime as needed for sleep.  06/09/14   [provider]    Family History Family History  Problem Relation Age of Onset   Cancer Mother    Heart attack Father    Colon cancer Maternal Grandmother    Colon cancer Paternal Grandmother     Social History Social History   Tobacco Use   Smoking status: Never    Smokeless tobacco: Never  Substance Use Topics   Alcohol use: No    Alcohol/week: 0.0 standard drinks of alcohol   Drug use: No     Allergies   Bee venom, Peanuts [peanut oil], Shellfish allergy, Aspirin, Corn-containing products, Ibuprofen, Latex, Morphine and related, and Zofran [ondansetron hcl]   Review of Systems Review of Systems  Respiratory:  Positive for cough.      Physical Exam Triage Vital Signs ED Triage Vitals [03/07/22 1743]  Enc Vitals Group     BP (!) 154/94     Pulse Rate 93     Resp 18  Temp 98.6 F (37 C)     Temp Source Oral     SpO2 98 %     Weight      Height      Head Circumference      Peak Flow      Pain Score 9     Pain Loc      Pain Edu?      Excl. in GC?    No data found.  Updated Vital Signs BP (!) 154/94 (BP Location: Left Arm)   Pulse 93   Temp 98.6 F (37 C) (Oral)   Resp 18   SpO2 98%   Visual Acuity Right Eye Distance:   Left Eye Distance:   Bilateral Distance:    Right Eye Near:   Left Eye Near:    Bilateral Near:     Physical Exam Vitals reviewed.  Constitutional:      General: She is not in acute distress.    Appearance: She is not toxic-appearing.  HENT:     Right Ear: Tympanic membrane and ear canal normal.     Left Ear: Tympanic membrane and ear canal normal.     Nose: Congestion present.     Mouth/Throat:     Mouth: Mucous membranes are moist.     Comments: There is white mucus draining in the oropharynx Eyes:     Extraocular Movements: Extraocular movements intact.     Conjunctiva/sclera: Conjunctivae normal.     Pupils: Pupils are equal, round, and reactive to light.  Cardiovascular:     Rate and Rhythm: Normal rate and regular rhythm.     Heart sounds: No murmur heard. Pulmonary:     Effort: No respiratory distress.     Breath sounds: No stridor. No wheezing, rhonchi or rales.  Musculoskeletal:     Cervical back: Neck supple.  Lymphadenopathy:     Cervical: No cervical adenopathy.   Skin:    Capillary Refill: Capillary refill takes less than 2 seconds.     Coloration: Skin is not jaundiced or pale.  Neurological:     General: No focal deficit present.     Mental Status: She is alert and oriented to person, place, and time.  Psychiatric:        Behavior: Behavior normal.      UC Treatments / Results  Labs (all labs ordered are listed, but only abnormal results are displayed) Labs Reviewed  SARS CORONAVIRUS 2 (TAT 6-24 HRS)    EKG   Radiology DG Chest 2 View  Result Date: 03/07/2022 CLINICAL DATA:  Chest radiograph 02/27/2016 EXAM: CHEST - 2 VIEW COMPARISON:  None Available. FINDINGS: The heart size and mediastinal contours are within normal limits. The lungs are clear. No pneumothorax or pleural effusion. The visualized skeletal structures are unremarkable. Chronic dilated loops of bowel are identified in the abdomen. IMPRESSION: No active cardiopulmonary disease. Electronically Signed   By: Emmaline Kluver M.D.   On: 03/07/2022 18:09    Procedures Procedures (including critical care time)  Medications Ordered in UC Medications - No data to display  Initial Impression / Assessment and Plan / UC Course  I have reviewed the triage vital signs and the nursing notes.  Pertinent labs & imaging results that were available during my care of the patient were reviewed by me and considered in my medical decision making (see chart for details).        Chest x-ray is clear.  She is treated today for acute sinusitis and  COPD exacerbation Final Clinical Impressions(s) / UC Diagnoses   Final diagnoses:  COPD exacerbation ([place])  Acute cough     Discharge Instructions      Your chest x-ray was clear  Albuterol inhaler--do 2 puffs every 4 hours as needed for shortness of breath or wheezing  Take prednisone 20 mg--2 daily for 5 days  Take doxycycline 100 mg --1 capsule 2 times daily for 7 days  Take benzonatate 100 mg, 1 tab every 8 hours as  needed for cough.   You have been swabbed for COVID, and the test will result in the next 24 hours. Our staff will call you if positive. If the COVID test is positive, you should quarantine for 5 days from the start of your symptoms      ED Prescriptions     Medication Sig Dispense Auth. Provider   albuterol (VENTOLIN HFA) 108 (90 Base) MCG/ACT inhaler Inhale 2 puffs into the lungs every 4 (four) hours as needed for wheezing or shortness of breath. 1 each Barrett Henle, MD   predniSONE (DELTASONE) 20 MG tablet Take 2 tablets (40 mg total) by mouth daily with breakfast for 5 days. 10 tablet Barrett Henle, MD   benzonatate (TESSALON) 100 MG capsule Take 1 capsule (100 mg total) by mouth 3 (three) times daily as needed for cough. 21 capsule Barrett Henle, MD   doxycycline (VIBRAMYCIN) 100 MG capsule Take 1 capsule (100 mg total) by mouth 2 (two) times daily for 7 days. 14 capsule Otilio Groleau, Gwenlyn Perking, MD      I have reviewed the PDMP during this encounter.   Barrett Henle, MD 03/07/22 [phone]

## 2022-03-08 LAB — SARS CORONAVIRUS 2 (TAT 6-24 HRS): SARS Coronavirus 2: NEGATIVE

## 2022-03-14 ENCOUNTER — Emergency Department (HOSPITAL_COMMUNITY)
Admission: EM | Admit: 2022-03-14 | Discharge: 2022-03-14 | Disposition: A | Discharge status: Home / Self Care | Payer: Medicare Other | Attending: Nurse Practitioner | Admitting: Nurse Practitioner

## 2022-03-14 ENCOUNTER — Other Ambulatory Visit: Payer: Self-pay

## 2022-03-14 ENCOUNTER — Encounter (HOSPITAL_COMMUNITY): Payer: Self-pay | Admitting: Emergency Medicine

## 2022-03-14 DIAGNOSIS — Y93G3 Activity, cooking and baking: Secondary | ICD-10-CM | POA: Diagnosis not present

## 2022-03-14 DIAGNOSIS — T2112XA Burn of first degree of abdominal wall, initial encounter: Secondary | ICD-10-CM | POA: Diagnosis present

## 2022-03-14 DIAGNOSIS — X102XXA Contact with fats and cooking oils, initial encounter: Secondary | ICD-10-CM | POA: Insufficient documentation

## 2022-03-14 DIAGNOSIS — T300 Burn of unspecified body region, unspecified degree: Secondary | ICD-10-CM

## 2022-03-14 MED ORDER — BACITRACIN ZINC 500 UNIT/GM EX OINT: 1.0000 | TOPICAL_OINTMENT | Freq: Two times a day (BID) | CUTANEOUS | 0 refills | Status: DC

## 2022-03-14 MED ORDER — ACETAMINOPHEN 325 MG PO TABS
650.0000 mg | ORAL_TABLET | Freq: Once | ORAL | Status: AC
  Administered 2022-03-14: 650 mg via ORAL
  Filled 2022-03-14: qty 2
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
## 2022-03-14 NOTE — Discharge Instructions (Addendum)
Please refer to attached instructions. Keep area clean. Avoid excessive moisture. Apply bacitracin to area two times per day

## 2022-03-18 ENCOUNTER — Ambulatory Visit (INDEPENDENT_AMBULATORY_CARE_PROVIDER_SITE_OTHER): Payer: Medicare Other

## 2022-03-18 ENCOUNTER — Encounter (HOSPITAL_COMMUNITY): Payer: Self-pay

## 2022-03-18 ENCOUNTER — Ambulatory Visit (HOSPITAL_COMMUNITY)
Admission: EM | Admit: 2022-03-18 | Discharge: 2022-03-18 | Disposition: A | Discharge status: Home / Self Care | Payer: Medicare Other | Attending: Family Medicine | Admitting: Family Medicine

## 2022-03-18 DIAGNOSIS — W19XXXA Unspecified fall, initial encounter: Secondary | ICD-10-CM

## 2022-03-18 DIAGNOSIS — M25562 Pain in left knee: Secondary | ICD-10-CM

## 2022-03-18 DIAGNOSIS — T2122XA Burn of second degree of abdominal wall, initial encounter: Secondary | ICD-10-CM

## 2022-03-18 MED ORDER — SILVER SULFADIAZINE 1 % EX CREA: 1.0000 | TOPICAL_CREAM | Freq: Every day | CUTANEOUS | 0 refills | Status: DC

## 2022-03-18 MED ORDER — HYDROCODONE-ACETAMINOPHEN 5-325 MG PO TABS: 1.0000 | ORAL_TABLET | Freq: Four times a day (QID) | ORAL | 0 refills | Status: DC | PRN
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
## 2022-03-18 NOTE — ED Triage Notes (Signed)
Chief Complaint: fall and left knee pain. Patient tripped on the wet ground. History of bone on bone arthritis in the left knee.   Onset: today   Prescriptions or OTC medications tried: No

## 2022-06-08 ENCOUNTER — Encounter (HOSPITAL_COMMUNITY): Payer: Self-pay

## 2022-06-08 ENCOUNTER — Ambulatory Visit (HOSPITAL_COMMUNITY)
Admission: EM | Admit: 2022-06-08 | Discharge: 2022-06-08 | Disposition: A | Discharge status: Home / Self Care | Payer: Medicare HMO | Attending: Emergency Medicine | Admitting: Emergency Medicine

## 2022-06-08 ENCOUNTER — Ambulatory Visit (INDEPENDENT_AMBULATORY_CARE_PROVIDER_SITE_OTHER): Payer: Medicare HMO

## 2022-06-08 DIAGNOSIS — R062 Wheezing: Secondary | ICD-10-CM

## 2022-06-08 DIAGNOSIS — J441 Chronic obstructive pulmonary disease with (acute) exacerbation: Secondary | ICD-10-CM

## 2022-06-08 DIAGNOSIS — J301 Allergic rhinitis due to pollen: Secondary | ICD-10-CM

## 2022-06-08 DIAGNOSIS — R0602 Shortness of breath: Secondary | ICD-10-CM | POA: Diagnosis not present

## 2022-06-08 MED ORDER — ALBUTEROL SULFATE HFA 108 (90 BASE) MCG/ACT IN AERS: 2.0000 | INHALATION_SPRAY | RESPIRATORY_TRACT | 0 refills | Status: AC | PRN

## 2022-06-08 MED ORDER — GUAIFENESIN ER 600 MG PO TB12: 1200.0000 mg | ORAL_TABLET | Freq: Two times a day (BID) | ORAL | 0 refills | Status: AC

## 2022-06-08 MED ORDER — FLUTICASONE PROPIONATE 50 MCG/ACT NA SUSP: 1.0000 | Freq: Every day | NASAL | 2 refills | Status: AC

## 2022-06-08 MED ORDER — PREDNISONE 10 MG PO TABS: 40.0000 mg | ORAL_TABLET | Freq: Every day | ORAL | 0 refills | Status: AC

## 2022-06-08 NOTE — ED Triage Notes (Signed)
Patient c/o a non productive cough and SOB x 4 days. Patient states she has a history of allergies and bronchitis.  Patient states she has been taking Allegra, but "not helping much."

## 2022-06-08 NOTE — Discharge Instructions (Addendum)
Your chest x-ray did not show any pneumonia or infection.  I believe you are having a COPD exacerbation.  Please take the steroids by mouth daily with breakfast for the next 5 days.  Please take Mucinex as well as drinking 64 ounces of water to help loosen your secretions.  The Flonase nasal spray can help with your congestion.  Use your albuterol inhaler as needed.  Please return to clinic or follow-up with your PCP if you have no improvement over the next 4 days or any new or worsening symptoms.

## 2022-06-08 NOTE — ED Provider Notes (Signed)
[facility]    CSN: [id number] Arrival date & time: 06/08/22  1446      History   Chief Complaint Chief Complaint  Patient presents with   Shortness of Breath   Cough    HPI [name] is a 62 y.o. female.   Patient presents to clinic for complaints of nonproductive cough, nasal congestion and shortness of breath for the last 4 days.  She reports that is hard to breathe due to the pollen.  She did start taking Allegra yesterday.  Reports some mild wheezing.  She denies fevers, hot or cold chills, nausea, vomiting or diarrhea.  She has been using her albuterol inhaler as needed.    The history is provided by the patient and medical records.  Shortness of Breath Associated symptoms: cough, sore throat and wheezing   Associated symptoms: no abdominal pain, no chest pain and no vomiting   Cough Associated symptoms: shortness of breath, sore throat and wheezing   Associated symptoms: no chest pain and no chills     Past Medical History:  Diagnosis Date   Asthma    Cataract    Chronic bronchitis    Chronic lower back pain    COPD (chronic obstructive pulmonary disease)    Depression    GERD (gastroesophageal reflux disease)    Heart murmur    History of blood transfusion 2003-2004   "related to bowel obstruction OR"   Hyperlipidemia    Hypertension    Insomnia    Osteoarthritis    "lower back; both hips; right leg" (08/01/2014)   SBO (small bowel obstruction) 10/2015    Patient Active Problem List   Diagnosis Date Noted   Nausea vomiting and diarrhea    Heart murmur 11/27/2021   Chronic low back pain 11/27/2021   Encounter for imaging study to confirm nasogastric (NG) tube placement    Choledocholithiasis    Calculus of gallbladder with biliary obstruction but without cholecystitis    Small bowel obstruction    Essential hypertension    Gastroesophageal reflux disease with esophagitis    Simple chronic bronchitis    SBO (small bowel obstruction)  08/14/2015   Hypokalemia 08/14/2015   Partial small bowel obstruction 03/22/2014   Abdominal pain 03/22/2014   COPD (chronic obstructive pulmonary disease)    Hypertension    GERD (gastroesophageal reflux disease)    Hyperlipidemia    Osteoarthritis    Asthma 06/03/2011   Family history of cardiovascular disease 06/03/2011   Depressive disorder 06/03/2011    Past Surgical History:  Procedure Laterality Date   ABDOMINAL EXPLORATION SURGERY  2004   abdominal compartment syndrome   ABDOMINAL EXPLORATION SURGERY  2003   small bowel prolapse through vagina   ABDOMINAL HYSTERECTOMY  2003   ADENOIDECTOMY  2002   BOWEL RESECTION  2003; 2004   x2 with primary anastamosis   COMBINED HYSTEROSCOPY DIAGNOSTIC / D&C  ~ 2003   HERNIA REPAIR     JOINT REPLACEMENT     LAPAROSCOPIC INCISIONAL / UMBILICAL / VENTRAL HERNIA REPAIR  2012   [facility], Dr. Carolynn Sayers   TONSILLECTOMY  1977   TOTAL KNEE ARTHROPLASTY Right 03/2012   [facility]    OB History   No obstetric history on file.      Home Medications    Prior to Admission medications   Medication Sig Start Date End Date Taking? Authorizing Provider  fluticasone (FLONASE) 50 MCG/ACT nasal spray Place 1 spray into both nostrils daily. 06/08/22  Yes Rinaldo Ratel,  [place] N, FNP  guaiFENesin (MUCINEX) 600 MG 12 hr tablet Take 2 tablets (1,200 mg total) by mouth 2 (two) times daily for 5 days. 06/08/22 06/13/22 Yes Rinaldo Ratel, [place] N, FNP  predniSONE (DELTASONE) 10 MG tablet Take 4 tablets (40 mg total) by mouth daily with breakfast for 5 days. 06/08/22 06/13/22 Yes Rinaldo Ratel, [place] N, FNP  albuterol (VENTOLIN HFA) 108 (90 Base) MCG/ACT inhaler Inhale 2 puffs into the lungs every 4 (four) hours as needed for wheezing or shortness of breath. 06/08/22   Runette Scifres, [place] N, FNP  amLODipine (NORVASC) 10 MG tablet Take 10 mg by mouth daily.    [provider]  bacitracin ointment Apply 1 Application topically 2 (two) times daily. 03/14/22   Felicie Morn, NP   diclofenac sodium (VOLTAREN) 1 % GEL Apply 2 g topically 4 (four) times daily as needed (knee pain).     [provider]  diphenhydrAMINE (BENADRYL) 25 MG tablet Take 25 mg by mouth every 6 (six) hours as needed for allergies (allergies).    [provider]  esomeprazole (NEXIUM) 40 MG capsule Take 40 mg by mouth daily at 12 noon.    [provider]  Fluticasone-Salmeterol (ADVAIR) 500-50 MCG/DOSE AEPB Inhale 1 puff into the lungs 2 (two) times daily.    [provider]  gabapentin (NEURONTIN) 300 MG capsule Take 300 mg by mouth 3 (three) times daily.    [provider]  HYDROcodone-acetaminophen (NORCO/VICODIN) 5-325 MG tablet Take 1 tablet by mouth every 6 (six) hours as needed (pain). 03/18/22   Zenia Resides, MD  ipratropium (ATROVENT) 0.06 % nasal spray Place 2 sprays into the nose 4 (four) times daily. 12/01/21   Adron Bene, MD  loratadine (CLARITIN) 10 MG tablet Take 10 mg by mouth daily.    [provider]  Multiple Vitamin (MULTIVITAMIN WITH MINERALS) TABS tablet Take 1 tablet by mouth daily.    [provider]  polyethylene glycol (MIRALAX / GLYCOLAX) packet Take 17 g by mouth daily as needed for mild constipation or moderate constipation (constipation).     [provider]  promethazine (PHENERGAN) 25 MG tablet Take 25 mg by mouth every 8 (eight) hours as needed for nausea or vomiting (nausea).  08/20/13   [provider]  rosuvastatin (CRESTOR) 10 MG tablet Take 10 mg by mouth daily.    [provider]  senna (SENOKOT) 8.6 MG TABS tablet Take 1 tablet (8.6 mg total) by mouth daily. 11/22/15   [name], MD  sertraline (ZOLOFT) 100 MG tablet Take 100 mg by mouth daily.    [provider]  silver sulfADIAZINE (SILVADENE) 1 % cream Apply 1 Application topically daily. 03/18/22   Zenia Resides, MD  tiotropium (SPIRIVA) 18 MCG inhalation capsule Place 18 mcg into inhaler and  inhale daily.    [provider]  traZODone (DESYREL) 100 MG tablet Take 100 mg by mouth at bedtime as needed for sleep.  06/09/14   [provider]    Family History Family History  Problem Relation Age of Onset   Cancer Mother    Heart attack Father    Colon cancer Maternal Grandmother    Colon cancer Paternal Grandmother     Social History Social History   Tobacco Use   Smoking status: Never   Smokeless tobacco: Never  Vaping Use   Vaping Use: Never used  Substance Use Topics   Alcohol use: No    Alcohol/week: 0.0 standard drinks of alcohol  Drug use: No     Allergies   Bee venom, Peanuts [peanut oil], Shellfish allergy, Aspirin, Corn-containing products, Ibuprofen, Latex, Morphine and related, and Zofran [ondansetron hcl]   Review of Systems Review of Systems  Constitutional:  Negative for chills and fatigue.  HENT:  Positive for congestion, sinus pressure and sore throat.   Respiratory:  Positive for cough, shortness of breath and wheezing.   Cardiovascular:  Negative for chest pain.  Gastrointestinal:  Negative for abdominal pain, diarrhea, nausea and vomiting.  Genitourinary:  Negative for dysuria.  Neurological:  Negative for syncope.     Physical Exam Triage Vital Signs ED Triage Vitals  Enc Vitals Group     BP 06/08/22 1455 129/85     Pulse Rate 06/08/22 1455 88     Resp 06/08/22 1455 16     Temp 06/08/22 1455 97.6 F (36.4 C)     Temp Source 06/08/22 1455 Oral     SpO2 06/08/22 1455 98 %     Weight --      Height --      Head Circumference --      Peak Flow --      Pain Score 06/08/22 1456 0     Pain Loc --      Pain Edu? --      Excl. in GC? --    No data found.  Updated Vital Signs BP 129/85 (BP Location: Right Arm)   Pulse 88   Temp 97.6 F (36.4 C) (Oral)   Resp 16   SpO2 98%   Visual Acuity Right Eye Distance:   Left Eye Distance:   Bilateral Distance:    Right Eye Near:   Left Eye Near:    Bilateral  Near:     Physical Exam Vitals and nursing note reviewed.  Constitutional:      General: She is not in acute distress.    Appearance: Normal appearance. She is well-developed.  HENT:     Head: Normocephalic and atraumatic.     Right Ear: External ear normal.     Left Ear: External ear normal.     Nose: Congestion and rhinorrhea present.     Mouth/Throat:     Mouth: Mucous membranes are moist.     Pharynx: Posterior oropharyngeal erythema present.  Eyes:     General: No scleral icterus.    Conjunctiva/sclera: Conjunctivae normal.  Cardiovascular:     Rate and Rhythm: Normal rate and regular rhythm.     Heart sounds: Normal heart sounds, S1 normal and S2 normal. No murmur heard. Pulmonary:     Effort: Pulmonary effort is normal. No respiratory distress.     Breath sounds: Examination of the right-upper field reveals wheezing. Examination of the left-upper field reveals wheezing. Examination of the right-middle field reveals wheezing. Examination of the left-middle field reveals wheezing. Examination of the right-lower field reveals wheezing. Examination of the left-lower field reveals wheezing. Wheezing present.     Comments: Expiratory wheezing in all lobes. Musculoskeletal:     Cervical back: Neck supple.  Skin:    General: Skin is warm and dry.     Capillary Refill: Capillary refill takes less than 2 seconds.  Neurological:     Mental Status: She is alert and oriented to person, place, and time.  Psychiatric:        Mood and Affect: Mood normal.        Behavior: Behavior normal. Behavior is cooperative.      UC Treatments /  Results  Labs (all labs ordered are listed, but only abnormal results are displayed) Labs Reviewed - No data to display  EKG   Radiology No results found.  Procedures Procedures (including critical care time)  Medications Ordered in UC Medications - No data to display  Initial Impression / Assessment and Plan / UC Course  I have reviewed  the triage vital signs and the nursing notes.  Pertinent labs & imaging results that were available during my care of the patient were reviewed by me and considered in my medical decision making (see chart for details).  Vitals in triage reviewed, patient is hemodynamically stable.  Patient reports shortness of breath, wheezing, nonproductive cough and nasal congestion for the past 4 days.  Symptoms appear to be related to allergies, suspect COPD flare.  Lungs with expiratory wheezing in all lobes, chest x-ray negative for infiltrate or infection.  Will do 40 mg of prednisone for the next 5 days as well as symptomatic management for her congestion.  Strict emergency and return precautions given, patient verbalized understanding, no questions at this time.     Final Clinical Impressions(s) / UC Diagnoses   Final diagnoses:  COPD exacerbation  Seasonal allergic rhinitis due to pollen     Discharge Instructions      Your chest x-ray did not show any pneumonia or infection.  I believe you are having a COPD exacerbation.  Please take the steroids by mouth daily with breakfast for the next 5 days.  Please take Mucinex as well as drinking 64 ounces of water to help loosen your secretions.  The Flonase nasal spray can help with your congestion.  Use your albuterol inhaler as needed.  Please return to clinic or follow-up with your PCP if you have no improvement over the next 4 days or any new or worsening symptoms.       ED Prescriptions     Medication Sig Dispense Auth. Provider   guaiFENesin (MUCINEX) 600 MG 12 hr tablet Take 2 tablets (1,200 mg total) by mouth 2 (two) times daily for 5 days. 20 tablet Rinaldo Ratel, [place] N, [place]   predniSONE (DELTASONE) 10 MG tablet Take 4 tablets (40 mg total) by mouth daily with breakfast for 5 days. 20 tablet Rinaldo Ratel, [place] N, [place]   albuterol (VENTOLIN HFA) 108 (90 Base) MCG/ACT inhaler Inhale 2 puffs into the lungs every 4 (four) hours as needed for  wheezing or shortness of breath. 1 each Laynie Espy, [place] N, FNP   fluticasone [facility]) 50 MCG/ACT nasal spray Place 1 spray into both nostrils daily. 9.9 mL Salimata Christenson, [place] N, FNP      PDMP not reviewed this encounter.   Enzio Buchler, [place] N, [place] 06/08/22 [phone]

## 2022-12-21 ENCOUNTER — Encounter (HOSPITAL_COMMUNITY): Payer: Self-pay | Admitting: Emergency Medicine

## 2022-12-21 ENCOUNTER — Encounter (HOSPITAL_COMMUNITY): Payer: Self-pay | Admitting: Pharmacy Technician

## 2022-12-21 ENCOUNTER — Emergency Department (HOSPITAL_COMMUNITY): Payer: Medicare HMO

## 2022-12-21 ENCOUNTER — Ambulatory Visit (HOSPITAL_COMMUNITY)
Admission: EM | Admit: 2022-12-21 | Discharge: 2022-12-21 | Disposition: A | Discharge status: Home / Self Care | Payer: Medicare HMO

## 2022-12-21 ENCOUNTER — Emergency Department (HOSPITAL_COMMUNITY)
Admission: EM | Admit: 2022-12-21 | Discharge: 2022-12-21 | Discharge status: Against Medical Advice | Payer: Medicare HMO | Attending: Emergency Medicine | Admitting: Emergency Medicine

## 2022-12-21 DIAGNOSIS — R519 Headache, unspecified: Secondary | ICD-10-CM | POA: Diagnosis present

## 2022-12-21 DIAGNOSIS — W01198A Fall on same level from slipping, tripping and stumbling with subsequent striking against other object, initial encounter: Secondary | ICD-10-CM | POA: Diagnosis not present

## 2022-12-21 DIAGNOSIS — Z5321 Procedure and treatment not carried out due to patient leaving prior to being seen by health care provider: Secondary | ICD-10-CM | POA: Insufficient documentation

## 2022-12-21 DIAGNOSIS — R55 Syncope and collapse: Secondary | ICD-10-CM

## 2022-12-21 NOTE — ED Notes (Signed)
Pt left due to h
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
## 2022-12-21 NOTE — ED Triage Notes (Signed)
Pt hit head she was picking up something off floor and hit front of head on glass table. Daughter told her she passed out but pt does not remember passing out. She c/o headache over left eye where she hit head

## 2022-12-21 NOTE — ED Notes (Addendum)
Patient is being discharged from the Urgent Care and sent to the Emergency Department via POV . Per Loreta Ave, MD, patient is in need of higher level of care due to Loss of consciousness. Patient is aware and verbalizes understanding of plan of care.  Vitals:   12/21/22 1554  BP: 112/76  Pulse: 87  Resp: 16  Temp: 98.5 F (36.9 C)  SpO2: 96%

## 2022-12-21 NOTE — Discharge Instructions (Signed)
She will go to the ER for evaluation

## 2022-12-21 NOTE — ED Notes (Signed)
C collar placed in triage

## 2022-12-21 NOTE — ED Provider Notes (Signed)
Patient seen briefly in triage.  Passed out today, unclear if that caused her fall, or if she fell and passed out when she hit her head on a glass table (daughter witnessed the fall).  Head is hurting a lot. Feels dizzy.  VS reassuring here.  I have asked her to proceed to the ER for evaluation and treatment we cannot provide here at an urgent care. She will proceed there   Zenia Resides, MD 12/21/22 [phone]

## 2022-12-21 NOTE — ED Notes (Signed)
Pt left due to wait time and headache pain.

## 2022-12-23 ENCOUNTER — Other Ambulatory Visit: Payer: Self-pay | Admitting: Nurse Practitioner

## 2022-12-23 ENCOUNTER — Other Ambulatory Visit: Payer: Self-pay

## 2022-12-23 DIAGNOSIS — Z1231 Encounter for screening mammogram for malignant neoplasm of breast: Secondary | ICD-10-CM

## 2023-02-08 ENCOUNTER — Other Ambulatory Visit: Payer: Self-pay | Admitting: Nurse Practitioner

## 2023-02-08 DIAGNOSIS — Z1231 Encounter for screening mammogram for malignant neoplasm of breast: Secondary | ICD-10-CM

## 2023-02-14 ENCOUNTER — Other Ambulatory Visit: Payer: Self-pay

## 2023-02-14 DIAGNOSIS — N6320 Unspecified lump in the left breast, unspecified quadrant: Secondary | ICD-10-CM

## 2023-03-08 ENCOUNTER — Other Ambulatory Visit: Payer: Self-pay | Admitting: Nurse Practitioner

## 2023-03-08 DIAGNOSIS — N6320 Unspecified lump in the left breast, unspecified quadrant: Secondary | ICD-10-CM

## 2023-03-30 ENCOUNTER — Ambulatory Visit
Admission: RE | Admit: 2023-03-30 | Discharge: 2023-03-30 | Disposition: A | Discharge status: Home / Self Care | Payer: Medicare HMO | Source: Ambulatory Visit | Attending: Nurse Practitioner

## 2023-03-30 ENCOUNTER — Ambulatory Visit
Admission: RE | Admit: 2023-03-30 | Discharge: 2023-03-30 | Disposition: A | Discharge status: Home / Self Care | Payer: 59 | Source: Ambulatory Visit | Attending: Nurse Practitioner

## 2023-03-30 DIAGNOSIS — N6320 Unspecified lump in the left breast, unspecified quadrant: Secondary | ICD-10-CM

## 2023-05-03 ENCOUNTER — Emergency Department (HOSPITAL_COMMUNITY)

## 2023-05-03 ENCOUNTER — Encounter (HOSPITAL_COMMUNITY): Payer: Self-pay | Admitting: Emergency Medicine

## 2023-05-03 ENCOUNTER — Emergency Department (HOSPITAL_COMMUNITY)
Admission: EM | Admit: 2023-05-03 | Discharge: 2023-05-04 | Disposition: A | Discharge status: Home / Self Care | Attending: Emergency Medicine | Admitting: Emergency Medicine

## 2023-05-03 ENCOUNTER — Other Ambulatory Visit: Payer: Self-pay

## 2023-05-03 DIAGNOSIS — Z91010 Allergy to peanuts: Secondary | ICD-10-CM | POA: Insufficient documentation

## 2023-05-03 DIAGNOSIS — S0990XA Unspecified injury of head, initial encounter: Secondary | ICD-10-CM | POA: Diagnosis not present

## 2023-05-03 DIAGNOSIS — R519 Headache, unspecified: Secondary | ICD-10-CM | POA: Diagnosis present

## 2023-05-03 DIAGNOSIS — W01110A Fall on same level from slipping, tripping and stumbling with subsequent striking against sharp glass, initial encounter: Secondary | ICD-10-CM | POA: Diagnosis not present

## 2023-05-03 DIAGNOSIS — M542 Cervicalgia: Secondary | ICD-10-CM | POA: Insufficient documentation

## 2023-05-03 DIAGNOSIS — I10 Essential (primary) hypertension: Secondary | ICD-10-CM | POA: Diagnosis not present

## 2023-05-03 DIAGNOSIS — Z91040 Latex allergy status: Secondary | ICD-10-CM | POA: Diagnosis not present

## 2023-05-03 DIAGNOSIS — W19XXXA Unspecified fall, initial encounter: Secondary | ICD-10-CM

## 2023-05-03 DIAGNOSIS — J449 Chronic obstructive pulmonary disease, unspecified: Secondary | ICD-10-CM | POA: Diagnosis not present

## 2023-05-03 NOTE — ED Triage Notes (Signed)
 Patient reports fall 3 days ago and hit head on glass table. + LOC. Not evaluated after fall. Complaining of headache, blurry vision, and neck pain. Denies blood thinners.

## 2023-05-03 NOTE — ED Provider Triage Note (Signed)
 Emergency Medicine Provider Triage Evaluation Note  [name] , a 63 y.o. female  was evaluated in triage.  Pt complains of head trauma.  Review of Systems  Positive:  Negative:   Physical Exam  BP (!) 142/94 (BP Location: Right Arm)   Pulse 90   Temp 98.2 F (36.8 C)   Resp 18   Ht 5\' 2"  (1.575 m)   Wt 78.5 kg   SpO2 100%   BMI 31.64 kg/m  Gen:   Awake, no distress   Resp:  Normal effort  MSK:   Moves extremities without difficulty  Other:    Medical Decision Making  Medically screening exam initiated at 9:04 PM.  Appropriate orders placed.  [name] was informed that the remainder of the evaluation will be completed by another provider, this initial triage assessment does not replace that evaluation, and the importance of remaining in the ED until their evaluation is complete.  Fell and hit head on glass table 3 days ago. Now has headache, blurry vision, and neck pain.    [name], [place] 05/03/23 2105

## 2023-05-04 DIAGNOSIS — S0990XA Unspecified injury of head, initial encounter: Secondary | ICD-10-CM | POA: Diagnosis not present

## 2023-05-04 MED ORDER — PROCHLORPERAZINE EDISYLATE 10 MG/2ML IJ SOLN
10.0000 mg | Freq: Once | INTRAMUSCULAR | Status: AC
  Administered 2023-05-04: 10 mg via INTRAMUSCULAR
  Filled 2023-05-04: qty 2

## 2023-05-04 MED ORDER — DEXAMETHASONE SODIUM PHOSPHATE 10 MG/ML IJ SOLN
10.0000 mg | Freq: Once | INTRAMUSCULAR | Status: AC
  Administered 2023-05-04: 10 mg via INTRAMUSCULAR
  Filled 2023-05-04: qty 1

## 2023-05-04 NOTE — ED Provider Notes (Signed)
 [name] EMERGENCY DEPARTMENT AT [facility] Provider Note   CSN: [id number] Arrival date & time: 05/03/23  2045     History  Chief Complaint  Patient presents with   [name] is a 63 y.o. female.  Patient presents to the emergency department complaining of headache and lightheadedness secondary to a mechanical fall.  Patient states that 3 days ago she tripped and fell hitting her head on a glass table.  She does endorse loss of consciousness at that time.  She has not been evaluated since the fall.  She does not take blood thinners.   Fall       Home Medications Prior to Admission medications   Medication Sig Start Date End Date Taking? Authorizing Provider  albuterol (VENTOLIN HFA) 108 (90 Base) MCG/ACT inhaler Inhale 2 puffs into the lungs every 4 (four) hours as needed for wheezing or shortness of breath. 06/08/22   Garrison, [place] N, FNP  amLODipine (NORVASC) 10 MG tablet Take 10 mg by mouth daily.    [provider]  bacitracin ointment Apply 1 Application topically 2 (two) times daily. 03/14/22   Felicie Morn, NP  diclofenac sodium (VOLTAREN) 1 % GEL Apply 2 g topically 4 (four) times daily as needed (knee pain).     [provider]  diphenhydrAMINE (BENADRYL) 25 MG tablet Take 25 mg by mouth every 6 (six) hours as needed for allergies (allergies).    [provider]  esomeprazole (NEXIUM) 40 MG capsule Take 40 mg by mouth daily at 12 noon.    [provider]  fluticasone (FLONASE) 50 MCG/ACT nasal spray Place 1 spray into both nostrils daily. 06/08/22   Garrison, [place] N, FNP  Fluticasone-Salmeterol (ADVAIR) 500-50 MCG/DOSE AEPB Inhale 1 puff into the lungs 2 (two) times daily.    [provider]  gabapentin (NEURONTIN) 300 MG capsule Take 300 mg by mouth 3 (three) times daily.    [provider]  HYDROcodone-acetaminophen (NORCO/VICODIN) 5-325 MG tablet Take 1 tablet by mouth every 6 (six)  hours as needed (pain). 03/18/22   Zenia Resides, MD  ipratropium (ATROVENT) 0.06 % nasal spray Place 2 sprays into the nose 4 (four) times daily. 12/01/21   Adron Bene, MD  loratadine (CLARITIN) 10 MG tablet Take 10 mg by mouth daily.    [provider]  Multiple Vitamin (MULTIVITAMIN WITH MINERALS) TABS tablet Take 1 tablet by mouth daily.    [provider]  polyethylene glycol (MIRALAX / GLYCOLAX) packet Take 17 g by mouth daily as needed for mild constipation or moderate constipation (constipation).     [provider]  promethazine (PHENERGAN) 25 MG tablet Take 25 mg by mouth every 8 (eight) hours as needed for nausea or vomiting (nausea).  08/20/13   [provider]  rosuvastatin (CRESTOR) 10 MG tablet Take 10 mg by mouth daily.    [provider]  senna (SENOKOT) 8.6 MG TABS tablet Take 1 tablet (8.6 mg total) by mouth daily. 11/22/15   [name], MD  sertraline (ZOLOFT) 100 MG tablet Take 100 mg by mouth daily.    [provider]  silver sulfADIAZINE (SILVADENE) 1 % cream Apply 1 Application topically daily. 03/18/22   Zenia Resides, MD  tiotropium (SPIRIVA) 18 MCG inhalation capsule Place 18 mcg into inhaler and inhale daily.    [provider]  traZODone (DESYREL) 100 MG tablet Take 100 mg by mouth at bedtime as needed for sleep.  06/09/14   [provider]      Allergies    Bee venom, Peanuts [peanut oil], Shellfish allergy, Aspirin, Corn-containing products, Ibuprofen, Latex, Morphine and codeine, and Zofran [ondansetron hcl]    Review of Systems   Review of Systems  Physical Exam Updated Vital Signs BP (!) 138/93 (BP Location: Right Arm)   Pulse 72   Temp 97.8 F (36.6 C) (Oral)   Resp 18   Ht 5\' 2"  (1.575 m)   Wt 78.5 kg   SpO2 99%   BMI 31.64 kg/m  Physical Exam Vitals and nursing note reviewed.  HENT:     Head: Normocephalic and atraumatic.  Eyes:     Pupils: Pupils are equal,  round, and reactive to light.  Pulmonary:     Effort: Pulmonary effort is normal. No respiratory distress.  Musculoskeletal:        General: No signs of injury.     Cervical back: Normal range of motion.  Skin:    General: Skin is dry.  Neurological:     General: No focal deficit present.     Mental Status: She is alert.     Cranial Nerves: No cranial nerve deficit.  Psychiatric:        Speech: Speech normal.        Behavior: Behavior normal.     ED Results / Procedures / Treatments   Labs (all labs ordered are listed, but only abnormal results are displayed) Labs Reviewed - No data to display  EKG None  Radiology CT Head Wo Contrast Result Date: 05/03/2023 CLINICAL DATA:  Polytrauma, blunt.  Fall EXAM: CT HEAD WITHOUT CONTRAST CT CERVICAL SPINE WITHOUT CONTRAST TECHNIQUE: Multidetector CT imaging of the head and cervical spine was performed following the standard protocol without intravenous contrast. Multiplanar CT image reconstructions of the cervical spine were also generated. RADIATION DOSE REDUCTION: This exam was performed according to the departmental dose-optimization program which includes automated exposure control, adjustment of the mA and/or kV according to patient size and/or use of iterative reconstruction technique. COMPARISON:  CT head and C-spine 12/21/2022 FINDINGS: CT HEAD FINDINGS Brain: No evidence of large-territorial acute infarction. No parenchymal hemorrhage. No mass lesion. No extra-axial collection. No mass effect or midline shift. No hydrocephalus. Basilar cisterns are patent. Vascular: No hyperdense vessel. Skull: No acute fracture or focal lesion. Sinuses/Orbits: Paranasal sinuses and mastoid air cells are clear. The orbits are unremarkable. Other: None. CT CERVICAL SPINE FINDINGS Alignment: Normal. Skull base and vertebrae: Multilevel degenerative changes of the spine most prominent at the C4-C5 level. No acute fracture. No aggressive appearing focal  osseous lesion or focal pathologic process. Soft tissues and spinal canal: No prevertebral fluid or swelling. No visible canal hematoma. Upper chest: Unremarkable. Other: Atherosclerotic plaque of the aorta. IMPRESSION: 1. No acute intracranial abnormality. 2. No acute displaced fracture or traumatic listhesis of the cervical spine. 3.  Aortic Atherosclerosis (ICD10-I70.0). Electronically Signed   By: Tish Frederickson M.D.   On: 05/03/2023 22:37   CT Cervical Spine Wo Contrast Result Date: 05/03/2023 CLINICAL DATA:  Polytrauma, blunt.  Fall EXAM: CT HEAD WITHOUT CONTRAST CT CERVICAL SPINE WITHOUT CONTRAST TECHNIQUE: Multidetector CT imaging of the head and cervical spine was performed following the standard protocol without intravenous contrast. Multiplanar CT image reconstructions of the cervical spine were also generated. RADIATION DOSE REDUCTION: This exam was performed according to the departmental dose-optimization program which includes automated exposure control, adjustment of the mA and/or kV according to patient size and/or use  of iterative reconstruction technique. COMPARISON:  CT head and C-spine 12/21/2022 FINDINGS: CT HEAD FINDINGS Brain: No evidence of large-territorial acute infarction. No parenchymal hemorrhage. No mass lesion. No extra-axial collection. No mass effect or midline shift. No hydrocephalus. Basilar cisterns are patent. Vascular: No hyperdense vessel. Skull: No acute fracture or focal lesion. Sinuses/Orbits: Paranasal sinuses and mastoid air cells are clear. The orbits are unremarkable. Other: None. CT CERVICAL SPINE FINDINGS Alignment: Normal. Skull base and vertebrae: Multilevel degenerative changes of the spine most prominent at the C4-C5 level. No acute fracture. No aggressive appearing focal osseous lesion or focal pathologic process. Soft tissues and spinal canal: No prevertebral fluid or swelling. No visible canal hematoma. Upper chest: Unremarkable. Other: Atherosclerotic  plaque of the aorta. IMPRESSION: 1. No acute intracranial abnormality. 2. No acute displaced fracture or traumatic listhesis of the cervical spine. 3.  Aortic Atherosclerosis (ICD10-I70.0). Electronically Signed   By: Tish Frederickson M.D.   On: 05/03/2023 22:37    Procedures Procedures    Medications Ordered in ED Medications  dexamethasone (DECADRON) injection 10 mg (10 mg Intramuscular Given 05/04/23 0403)  prochlorperazine (COMPAZINE) injection 10 mg (10 mg Intramuscular Given 05/04/23 0402)    ED Course/ Medical Decision Making/ A&P                                 Medical Decision Making Risk Prescription drug management.   This patient presents to the ED for concern of headache, this involves an extensive number of treatment options, and is a complaint that carries with it a high risk of complications and morbidity.  The differential diagnosis includes intracranial abnormality, fracture, dislocation, migraine, tension headache, concussion, others   Co morbidities that complicate the patient evaluation  History of COPD, hypertension, SBO    Imaging Studies ordered:  I ordered imaging studies including CT head and cervical spine without contrast I independently visualized and interpreted imaging which showed no acute findings I agree with the radiologist interpretation   Problem List / ED Course / Critical interventions / Medication management   I ordered medication including Decadron and Compazine for headache Reevaluation of the patient after these medicines showed that the patient improved I have reviewed the patients home medicines and have made adjustments as needed   Social Determinants of Health:  Social determinants of health were considered but were not a significant factor during this encounter   Test / Admission - Considered:  Patient with no acute findings on imaging.  Grossly unremarkable neuroexam.  Symptoms most consistent with a mild concussion.   Plan to discharge home with recommendations for over-the-counter pain medication and follow-up as needed with concussion clinic.         Final Clinical Impression(s) / ED Diagnoses Final diagnoses:  Fall, initial encounter  Minor head injury, initial encounter    Rx / DC Orders ED Discharge Orders     None         Pamala Duffel 05/04/23 0645    Maia Plan, MD 05/05/23 [phone]

## 2023-05-04 NOTE — Discharge Instructions (Signed)
You have had a head injury which does not appear to require admission at this time. A concussion is a state of changed mental ability from trauma. ° °SEEK IMMEDIATE MEDICAL ATTENTION IF: °There is confusion or drowsiness (although children frequently become drowsy after injury).  °You cannot awaken the injured person.  °There is nausea (feeling sick to your stomach) or continued, forceful vomiting.  °You notice dizziness or unsteadiness which is getting worse, or inability to walk.  °You have convulsions or unconsciousness.  °You experience severe, persistent headaches not relieved by Tylenol. (Do not take aspirin as this impairs clotting abilities). Take other pain medications only as directed.  °You cannot use arms or legs normally.  °There are changes in pupil sizes. (This is the black center in the colored part of the eye)  °There is clear or bloody discharge from the nose or ears.  °Change in speech, vision, swallowing, or understanding.  °Localized weakness, numbness, tingling, or change in bowel or bladder control. ° °

## 2023-08-09 ENCOUNTER — Encounter (HOSPITAL_COMMUNITY): Payer: Self-pay

## 2023-08-09 ENCOUNTER — Ambulatory Visit (INDEPENDENT_AMBULATORY_CARE_PROVIDER_SITE_OTHER)

## 2023-08-09 ENCOUNTER — Ambulatory Visit (HOSPITAL_COMMUNITY): Admission: EM | Admit: 2023-08-09 | Discharge: 2023-08-09 | Disposition: A | Discharge status: Home / Self Care

## 2023-08-09 DIAGNOSIS — S8002XA Contusion of left knee, initial encounter: Secondary | ICD-10-CM | POA: Diagnosis not present

## 2023-08-09 MED ORDER — PREDNISONE 20 MG PO TABS: 40.0000 mg | ORAL_TABLET | Freq: Every day | ORAL | 0 refills | Status: AC

## 2023-08-09 NOTE — ED Provider Notes (Signed)
 [facility]  Note:  This document was prepared using Dragon voice recognition software and may include unintentional dictation errors.  MRN: [id number] DOB: Jul 07, 1960  Subjective:   Zoe Tijerino Strickland is a 63 y.o. female presenting for left anterior knee pain after a fall on her left knee earlier today.  Patient reports 10 out of 10 left knee pain after her fall.  Patient has not taken any over-the-counter medication to treat symptoms other than her prescribed gabapentin .  Patient reports that she has history of severe osteoarthritis but states that pain is more severe than her normal arthritis pain.  No current facility-administered medications for this encounter.  Current Outpatient Medications:    predniSONE  (DELTASONE ) 20 MG tablet, Take 2 tablets (40 mg total) by mouth daily for 5 days., Disp: 10 tablet, Rfl: 0   albuterol  (VENTOLIN  HFA) 108 (90 Base) MCG/ACT inhaler, Inhale 2 puffs into the lungs every 4 (four) hours as needed for wheezing or shortness of breath., Disp: 1 each, Rfl: 0   amLODipine  (NORVASC ) 10 MG tablet, Take 10 mg by mouth daily., Disp: , Rfl:    bacitracin  ointment, Apply 1 Application topically 2 (two) times daily., Disp: 120 g, Rfl: 0   diclofenac  sodium (VOLTAREN ) 1 % GEL, Apply 2 g topically 4 (four) times daily as needed (knee pain). , Disp: , Rfl:    diphenhydrAMINE  (BENADRYL ) 25 MG tablet, Take 25 mg by mouth every 6 (six) hours as needed for allergies (allergies)., Disp: , Rfl:    esomeprazole (NEXIUM) 40 MG capsule, Take 40 mg by mouth daily at 12 noon., Disp: , Rfl:    fluticasone  (FLONASE ) 50 MCG/ACT nasal spray, Place 1 spray into both nostrils daily., Disp: 9.9 mL, Rfl: 2   Fluticasone -Salmeterol (ADVAIR) 500-50 MCG/DOSE AEPB, Inhale 1 puff into the lungs 2 (two) times daily., Disp: , Rfl:    gabapentin  (NEURONTIN ) 300 MG capsule, Take 300 mg by mouth 3 (three) times daily., Disp: , Rfl:    HYDROcodone -acetaminophen  (NORCO/VICODIN) 5-325 MG  tablet, Take 1 tablet by mouth every 6 (six) hours as needed (pain)., Disp: 12 tablet, Rfl: 0   ipratropium (ATROVENT ) 0.06 % nasal spray, Place 2 sprays into the nose 4 (four) times daily., Disp: 15 mL, Rfl: 12   loratadine  (CLARITIN ) 10 MG tablet, Take 10 mg by mouth daily., Disp: , Rfl:    Multiple Vitamin (MULTIVITAMIN WITH MINERALS) TABS tablet, Take 1 tablet by mouth daily., Disp: , Rfl:    polyethylene glycol (MIRALAX  / GLYCOLAX ) packet, Take 17 g by mouth daily as needed for mild constipation or moderate constipation (constipation). , Disp: , Rfl:    promethazine  (PHENERGAN ) 25 MG tablet, Take 25 mg by mouth every 8 (eight) hours as needed for nausea or vomiting (nausea). , Disp: , Rfl:    rosuvastatin  (CRESTOR ) 10 MG tablet, Take 10 mg by mouth daily., Disp: , Rfl:    senna (SENOKOT) 8.6 MG TABS tablet, Take 1 tablet (8.6 mg total) by mouth daily., Disp: 120 each, Rfl: 0   sertraline  (ZOLOFT ) 100 MG tablet, Take 100 mg by mouth daily., Disp: , Rfl:    silver  sulfADIAZINE  (SILVADENE ) 1 % cream, Apply 1 Application topically daily., Disp: 50 g, Rfl: 0   tiotropium (SPIRIVA ) 18 MCG inhalation capsule, Place 18 mcg into inhaler and inhale daily., Disp: , Rfl:    traZODone  (DESYREL ) 100 MG tablet, Take 100 mg by mouth at bedtime as needed for sleep. , Disp: , Rfl: 1   Allergies  Allergen Reactions   Bee Venom Anaphylaxis and Itching   Peanuts [Peanut Oil] Anaphylaxis and Itching   Shellfish Allergy Anaphylaxis and Itching   Aspirin Itching   Corn-Containing Products Itching   Ibuprofen Itching   Latex Itching   Morphine  And Codeine Itching   Zofran  [Ondansetron  Hcl] Itching and Rash    Past Medical History:  Diagnosis Date   Asthma    Cataract    Chronic bronchitis (HCC)    Chronic lower back pain    COPD (chronic obstructive pulmonary disease) (HCC)    Depression    GERD (gastroesophageal reflux disease)    Heart murmur    History of blood transfusion 2003-2004   related to  bowel obstruction OR   Hyperlipidemia    Hypertension    Insomnia    Osteoarthritis    lower back; both hips; right leg (08/01/2014)   SBO (small bowel obstruction) (HCC) 10/2015     Past Surgical History:  Procedure Laterality Date   ABDOMINAL EXPLORATION SURGERY  2004   abdominal compartment syndrome   ABDOMINAL EXPLORATION SURGERY  2003   small bowel prolapse through vagina   ABDOMINAL HYSTERECTOMY  2003   ADENOIDECTOMY  2002   BOWEL RESECTION  2003; 2004   x2 with primary anastamosis   COMBINED HYSTEROSCOPY DIAGNOSTIC / D&C  ~ 2003   HERNIA REPAIR     JOINT REPLACEMENT     LAPAROSCOPIC INCISIONAL / UMBILICAL / VENTRAL HERNIA REPAIR  2012   [facility], Dr. Yancey Helena   TONSILLECTOMY  1977   TOTAL KNEE ARTHROPLASTY Right 03/2012   [facility]    Family History  Problem Relation Age of Onset   Breast cancer Mother    Cancer Mother    Heart attack Father    Colon cancer Maternal Grandmother    Colon cancer Paternal Grandmother     Social History   Tobacco Use   Smoking status: Never   Smokeless tobacco: Never  Vaping Use   Vaping status: Never Used  Substance Use Topics   Alcohol use: No    Alcohol/week: 0.0 standard drinks of alcohol   Drug use: No    ROS Refer to HPI for ROS details.  Objective:   Vitals: BP 117/80 (BP Location: Right Arm)   Pulse 77   Temp 98.6 F (37 C) (Oral)   Resp 15   SpO2 95%   Physical Exam Vitals and nursing note reviewed.  Constitutional:      General: She is not in acute distress.    Appearance: Normal appearance. She is well-developed. She is not ill-appearing or toxic-appearing.  HENT:     Head: Normocephalic and atraumatic.   Cardiovascular:     Rate and Rhythm: Normal rate.  Pulmonary:     Effort: Pulmonary effort is normal. No respiratory distress.   Musculoskeletal:     Left knee: Swelling and bony tenderness present. No deformity or ecchymosis. Normal range of motion. Tenderness present over the medial joint line.  Normal patellar mobility. Normal pulse.   Skin:    General: Skin is warm and dry.   Neurological:     General: No focal deficit present.     Mental Status: She is alert and oriented to person, place, and time.   Psychiatric:        Mood and Affect: Mood normal.        Behavior: Behavior normal.     Procedures  No results found for this or any previous visit (from the past 24  hours).  No results found.   Assessment and Plan :     Discharge Instructions       1. Contusion of left knee, initial encounter (Primary) - DG Knee Complete 4 Views Left x-ray performed in UC shows no acute fracture or dislocation of the left knee secondary to fall.  Final radiologist read still pending if there is any abnormality noted by radiologist I will call you and provide appropriate guidance for follow-up therapy. - predniSONE  (DELTASONE ) 20 MG tablet; Take 2 tablets (40 mg total) by mouth daily for 5 days for left knee inflammation and pain secondary to fall and osteoarthritis. - Continue to take gabapentin  as prescribed for neurologic pain - Apply ice 2-3 times a day for 10 to 15 minutes at a time for inflammation and pain - Keep leg elevated when resting to minimize any increase in inflammation -Continue to monitor symptoms for any change in severity if there is any escalation of current symptoms or development of new symptoms follow-up in ER for further evaluation and management.     Narvel Kozub B Miquel Stacks   Maze Corniel, [place], [place] 08/09/23 1514

## 2023-08-09 NOTE — Discharge Instructions (Addendum)
  1. Contusion of left knee, initial encounter (Primary) - DG Knee Complete 4 Views Left x-ray performed in UC shows no acute fracture or dislocation of the left knee secondary to fall.  Final radiologist read still pending if there is any abnormality noted by radiologist I will call you and provide appropriate guidance for follow-up therapy. - predniSONE  (DELTASONE ) 20 MG tablet; Take 2 tablets (40 mg total) by mouth daily for 5 days for left knee inflammation and pain secondary to fall and osteoarthritis. - Continue to take gabapentin  as prescribed for neurologic pain - Apply ice 2-3 times a day for 10 to 15 minutes at a time for inflammation and pain - Keep leg elevated when resting to minimize any increase in inflammation -Continue to monitor symptoms for any change in severity if there is any escalation of current symptoms or development of new symptoms follow-up in ER for further evaluation and management.

## 2023-08-09 NOTE — ED Triage Notes (Signed)
 Pt present with lt knee pain after a fall today. Pt states she fell directly on the lt knee. C/o 10/10 pain and has fell before and has knee pain.

## 2023-08-16 ENCOUNTER — Emergency Department (HOSPITAL_COMMUNITY)
Admission: EM | Admit: 2023-08-16 | Discharge: 2023-08-16 | Discharge status: Against Medical Advice | Attending: Emergency Medicine | Admitting: Emergency Medicine

## 2023-08-16 ENCOUNTER — Emergency Department (HOSPITAL_COMMUNITY)

## 2023-08-16 ENCOUNTER — Other Ambulatory Visit: Payer: Self-pay

## 2023-08-16 ENCOUNTER — Encounter (HOSPITAL_COMMUNITY): Payer: Self-pay

## 2023-08-16 DIAGNOSIS — M25552 Pain in left hip: Secondary | ICD-10-CM | POA: Diagnosis not present

## 2023-08-16 DIAGNOSIS — W010XXA Fall on same level from slipping, tripping and stumbling without subsequent striking against object, initial encounter: Secondary | ICD-10-CM | POA: Insufficient documentation

## 2023-08-16 DIAGNOSIS — M25562 Pain in left knee: Secondary | ICD-10-CM | POA: Insufficient documentation

## 2023-08-16 DIAGNOSIS — Z5321 Procedure and treatment not carried out due to patient leaving prior to being seen by health care provider: Secondary | ICD-10-CM | POA: Insufficient documentation

## 2023-08-16 NOTE — ED Provider Triage Note (Signed)
 Emergency Medicine Provider Triage Evaluation Note  VONNIE SPAGNOLO , a 63 y.o. female  was evaluated in triage.  Pt complains of fall that occurred earlier today.  She states she tripped.  No head injury or loss of consciousness.  Pain over the left knee, left femur..  Review of Systems  Positive: As above Negative: As above  Physical Exam  BP 135/79 (BP Location: Right Arm)   Pulse 77   Temp 98.2 F (36.8 C)   Resp 18   Ht 5' 2 (1.575 m)   Wt 78.5 kg   SpO2 97%   BMI 31.64 kg/m  Gen:   Awake, no distress   Resp:  Normal effort  MSK:   Moves extremities without difficulty  Other:    Medical Decision Making  Medically screening exam initiated at 3:31 PM.  Appropriate orders placed.  Carlene Bickley Stanek was informed that the remainder of the evaluation will be completed by another provider, this initial triage assessment does not replace that evaluation, and the importance of remaining in the ED until their evaluation is complete.     Hildegard Loge, PA-C 08/16/23 1531

## 2023-08-16 NOTE — ED Triage Notes (Signed)
 Patient reports she fell this morning landing on left knee.  Denies hitting head

## 2023-08-16 NOTE — ED Notes (Signed)
 Pt gave me her stickers and said she was leaving

## 2023-10-01 ENCOUNTER — Emergency Department (HOSPITAL_COMMUNITY)
Admission: EM | Admit: 2023-10-01 | Discharge: 2023-10-02 | Disposition: A | Discharge status: Home / Self Care | Attending: Emergency Medicine | Admitting: Emergency Medicine

## 2023-10-01 ENCOUNTER — Other Ambulatory Visit: Payer: Self-pay

## 2023-10-01 ENCOUNTER — Encounter (HOSPITAL_COMMUNITY): Payer: Self-pay | Admitting: *Deleted

## 2023-10-01 ENCOUNTER — Emergency Department (HOSPITAL_COMMUNITY)

## 2023-10-01 DIAGNOSIS — Z91040 Latex allergy status: Secondary | ICD-10-CM | POA: Insufficient documentation

## 2023-10-01 DIAGNOSIS — Z91010 Allergy to peanuts: Secondary | ICD-10-CM | POA: Diagnosis not present

## 2023-10-01 DIAGNOSIS — E860 Dehydration: Secondary | ICD-10-CM | POA: Diagnosis not present

## 2023-10-01 DIAGNOSIS — R1030 Lower abdominal pain, unspecified: Secondary | ICD-10-CM | POA: Diagnosis present

## 2023-10-01 DIAGNOSIS — R1032 Left lower quadrant pain: Secondary | ICD-10-CM | POA: Insufficient documentation

## 2023-10-01 LAB — COMPREHENSIVE METABOLIC PANEL WITH GFR
ALT: 8 U/L (ref 0–44)
AST: 28 U/L (ref 15–41)
Albumin: 3.8 g/dL (ref 3.5–5.0)
Alkaline Phosphatase: 57 U/L (ref 38–126)
Anion gap: 10 (ref 5–15)
BUN: 10 mg/dL (ref 8–23)
CO2: 26 mmol/L (ref 22–32)
Calcium: 9.2 mg/dL (ref 8.9–10.3)
Chloride: 104 mmol/L (ref 98–111)
Creatinine, Ser: 0.94 mg/dL (ref 0.44–1.00)
GFR, Estimated: >60 mL/min
Glucose, Bld: 86 mg/dL (ref 70–99)
Potassium: 3.8 mmol/L (ref 3.5–5.1)
Sodium: 140 mmol/L (ref 135–145)
Total Bilirubin: 0.9 mg/dL (ref 0.0–1.2)
Total Protein: 6.7 g/dL (ref 6.5–8.1)

## 2023-10-01 LAB — URINALYSIS, ROUTINE W REFLEX MICROSCOPIC
Bacteria, UA: NONE SEEN
Bilirubin Urine: NEGATIVE
Glucose, UA: NEGATIVE mg/dL
Hgb urine dipstick: NEGATIVE
Ketones, ur: NEGATIVE mg/dL
Leukocytes,Ua: NEGATIVE
Nitrite: NEGATIVE
Protein, ur: NEGATIVE mg/dL
Specific Gravity, Urine: 1.01 (ref 1.005–1.030)
pH: 5 (ref 5.0–8.0)

## 2023-10-01 LAB — CBC
HCT: 36.3 % (ref 36.0–46.0)
Hemoglobin: 11.8 g/dL — ABNORMAL LOW (ref 12.0–15.0)
MCH: 29.6 pg (ref 26.0–34.0)
MCHC: 32.5 g/dL (ref 30.0–36.0)
MCV: 91.2 fL (ref 80.0–100.0)
Platelets: 228 K/uL (ref 150–400)
RBC: 3.98 MIL/uL (ref 3.87–5.11)
RDW: 13.9 % (ref 11.5–15.5)
WBC: 5.6 K/uL (ref 4.0–10.5)
nRBC: 0 % (ref 0.0–0.2)

## 2023-10-01 LAB — LIPASE, BLOOD: Lipase: 28 U/L (ref 11–51)

## 2023-10-01 MED ORDER — IOHEXOL 350 MG/ML SOLN
75.0000 mL | Freq: Once | INTRAVENOUS | Status: AC | PRN
  Administered 2023-10-01: 75 mL via INTRAVENOUS

## 2023-10-01 MED ORDER — OXYCODONE-ACETAMINOPHEN 5-325 MG PO TABS
1.0000 | ORAL_TABLET | Freq: Once | ORAL | Status: AC
  Administered 2023-10-01: 1 via ORAL

## 2023-10-01 MED ORDER — SODIUM CHLORIDE 0.9 % IV BOLUS
1000.0000 mL | Freq: Once | INTRAVENOUS | Status: AC
  Administered 2023-10-01: 1000 mL via INTRAVENOUS

## 2023-10-01 MED ORDER — OXYCODONE-ACETAMINOPHEN 5-325 MG PO TABS
ORAL_TABLET | ORAL | Status: AC
  Filled 2023-10-01: qty 1

## 2023-10-01 NOTE — ED Triage Notes (Signed)
 Pt has said that she does not have a percecet allergy  I have called pharmacy and they agreed that the pt does not have an allergy to this and that I can go ahead and give the medicine

## 2023-10-01 NOTE — ED Provider Notes (Signed)
  Provider Note MRN:  [id number]  Arrival date & time: 10/02/23    ED Course and Medical Decision Making  Assumed care of patient at sign-out or upon transfer.  History of SBO presenting with similar symptoms awaiting CT.  1 AM update: Patient doing well on reassessment, workup reassuring with no evidence of SBO or other emergent process.  Appropriate for discharge.  Procedures  Final Clinical Impressions(s) / ED Diagnoses     ICD-10-CM   1. Left lower quadrant abdominal pain  R10.32       ED Discharge Orders     None         Discharge Instructions      You were evaluated in the Emergency Department and after careful evaluation, we did not find any emergent condition requiring admission or further testing in the hospital.  Your exam/testing today is overall reassuring.  Recommend follow-up with your regular doctor if your symptoms continue.  Recommend restricting your diet for the next 2 days to rest your bowels.  Please return to the Emergency Department if you experience any worsening of your condition.   Thank you for allowing us  to be a part of your care.      Ozell HERO. Theadore, MD [facility] Emergency Medicine [facility] [email or web address]    Theadore Ozell HERO, MD 10/02/23 [phone]

## 2023-10-01 NOTE — ED Triage Notes (Signed)
 The pt is c/o abd pain with nausea vomiting and diarrhea for 3 days

## 2023-10-01 NOTE — ED Provider Notes (Signed)
 [name] Provider Note   CSN: [id number] Arrival date & time: 10/01/23  8076     Patient presents with: Abdominal Pain   [name] is a 63 y.o. female history of previous small bowel obstruction status post hysterectomy, here presenting with abdominal pain and distention.  Patient states that she has multiple history of SBO's.  She had a hysterectomy that complicated by small bowel prolapse and then required small bowel resection and then subsequently has several admissions for small bowel obstruction.  She has lower abdominal pain for the last 2 to 3 days.  Also noticed abdominal distention.  She also noticed that she is vomiting up everything.  She still passing gas.  {Add pertinent medical, surgical, social history, OB history to YEP:67052} The history is provided by the patient.       Prior to Admission medications   Medication Sig Start Date End Date Taking? Authorizing Provider  albuterol  (VENTOLIN  HFA) 108 (90 Base) MCG/ACT inhaler Inhale 2 puffs into the lungs every 4 (four) hours as needed for wheezing or shortness of breath. 06/08/22   Dreama, [place]  N, FNP  amLODipine  (NORVASC ) 10 MG tablet Take 10 mg by mouth daily.    [provider]  bacitracin  ointment Apply 1 Application topically 2 (two) times daily. 03/14/22   Claudene Lenis, NP  diclofenac  sodium (VOLTAREN ) 1 % GEL Apply 2 g topically 4 (four) times daily as needed (knee pain).     [provider]  diphenhydrAMINE  (BENADRYL ) 25 MG tablet Take 25 mg by mouth every 6 (six) hours as needed for allergies (allergies).    [provider]  esomeprazole (NEXIUM) 40 MG capsule Take 40 mg by mouth daily at 12 noon.    [provider]  fluticasone  (FLONASE ) 50 MCG/ACT nasal spray Place 1 spray into both nostrils daily. 06/08/22   Dreama, [place]  N, FNP  Fluticasone -Salmeterol (ADVAIR) 500-50 MCG/DOSE AEPB Inhale 1 puff into the lungs 2 (two)  times daily.    [provider]  gabapentin  (NEURONTIN ) 300 MG capsule Take 300 mg by mouth 3 (three) times daily.    [provider]  HYDROcodone -acetaminophen  (NORCO/VICODIN) 5-325 MG tablet Take 1 tablet by mouth every 6 (six) hours as needed (pain). 03/18/22   Banister, Pamela K, MD  ipratropium (ATROVENT ) 0.06 % nasal spray Place 2 sprays into the nose 4 (four) times daily. 12/01/21   Teresa Carrier, MD  loratadine  (CLARITIN ) 10 MG tablet Take 10 mg by mouth daily.    [provider]  Multiple Vitamin (MULTIVITAMIN WITH MINERALS) TABS tablet Take 1 tablet by mouth daily.    [provider]  polyethylene glycol (MIRALAX  / GLYCOLAX ) packet Take 17 g by mouth daily as needed for mild constipation or moderate constipation (constipation).     [provider]  promethazine  (PHENERGAN ) 25 MG tablet Take 25 mg by mouth every 8 (eight) hours as needed for nausea or vomiting (nausea).  08/20/13   [provider]  rosuvastatin  (CRESTOR ) 10 MG tablet Take 10 mg by mouth daily.    [provider]  senna (SENOKOT) 8.6 MG TABS tablet Take 1 tablet (8.6 mg total) by mouth daily. 11/22/15   Abrol, Nayana, MD  sertraline  (ZOLOFT ) 100 MG tablet Take 100 mg by mouth daily.    [provider]  silver  sulfADIAZINE  (SILVADENE ) 1 % cream Apply 1 Application topically daily. 03/18/22   Banister, Pamela K, MD  tiotropium (SPIRIVA ) 18 MCG inhalation capsule  Place 18 mcg into inhaler and inhale daily.    [provider]  traZODone  (DESYREL ) 100 MG tablet Take 100 mg by mouth at bedtime as needed for sleep.  06/09/14   [provider]    Allergies: Bee venom, Peanuts [peanut oil], Shellfish allergy, Aspirin, Corn-containing products, Ibuprofen, Latex, Morphine  and codeine, and Zofran  [ondansetron  hcl]    Review of Systems  Gastrointestinal:  Positive for abdominal pain and vomiting.  All other systems reviewed and are  negative.   Updated Vital Signs BP (!) 126/93   Pulse 68   Temp 98.6 F (37 C)   Resp 16   Ht 5' 2 (1.575 m)   Wt 78.5 kg   SpO2 100%   BMI 31.65 kg/m   Physical Exam Vitals and nursing note reviewed.  Constitutional:      Comments: Uncomfortable and dehydrated  HENT:     Head: Normocephalic.     Mouth/Throat:     Pharynx: Oropharynx is clear.  Eyes:     Extraocular Movements: Extraocular movements intact.     Pupils: Pupils are equal, round, and reactive to light.  Cardiovascular:     Rate and Rhythm: Normal rate and regular rhythm.     Heart sounds: Normal heart sounds.  Pulmonary:     Effort: Pulmonary effort is normal.     Breath sounds: Normal breath sounds.  Abdominal:     Comments: Slightly distended.  Patient has lower abdominal scar that is well-healed.  Patient has left lower quadrant tenderness with no obvious rebound  Skin:    General: Skin is warm.     Capillary Refill: Capillary refill takes less than 2 seconds.  Neurological:     General: No focal deficit present.     Mental Status: She is oriented to person, place, and time.  Psychiatric:        Mood and Affect: Mood normal.        Behavior: Behavior normal.     (all labs ordered are listed, but only abnormal results are displayed) Labs Reviewed  CBC - Abnormal; Notable for the following components:      Result Value   Hemoglobin 11.8 (*)    All other components within normal limits  URINALYSIS, ROUTINE W REFLEX MICROSCOPIC - Abnormal; Notable for the following components:   Color, Urine STRAW (*)    All other components within normal limits  LIPASE, BLOOD  COMPREHENSIVE METABOLIC PANEL WITH GFR    EKG: None  Radiology: No results found.  {Document cardiac monitor, telemetry assessment procedure when appropriate:32947} Procedures   Medications Ordered in the ED  oxyCODONE -acetaminophen  (PERCOCET/ROXICET) 5-325 MG per tablet 1 tablet (1 tablet Oral Given 10/01/23 2004)  sodium  chloride 0.9 % bolus 1,000 mL (1,000 mLs Intravenous New Bag/Given 10/01/23 2142)      {Click here for ABCD2, HEART and other calculators REFRESH Note before signing:1}                              Medical Decision Making [name] is a 63 y.o. female here presenting with abdominal distention and vomiting.  Concern for possible small bowel obstruction.  Patient has a history of recurrent small bowel obstruction.  Plan to get CBC CMP and UA and CT abdomen pelvis.   Amount and/or Complexity of Data Reviewed Radiology: ordered.     Final diagnoses:  None    ED Discharge Orders  None

## 2023-10-02 NOTE — Discharge Instructions (Signed)
 You were evaluated in the Emergency Department and after careful evaluation, we did not find any emergent condition requiring admission or further testing in the hospital.  Your exam/testing today is overall reassuring.  Recommend follow-up with your regular doctor if your symptoms continue.  Recommend restricting your diet for the next 2 days to rest your bowels.  Please return to the Emergency Department if you experience any worsening of your condition.   Thank you for allowing us  to be a part of your care.

## 2023-11-22 ENCOUNTER — Emergency Department (HOSPITAL_COMMUNITY)

## 2023-11-22 ENCOUNTER — Encounter (HOSPITAL_COMMUNITY): Payer: Self-pay | Admitting: Emergency Medicine

## 2023-11-22 ENCOUNTER — Other Ambulatory Visit: Payer: Self-pay

## 2023-11-22 ENCOUNTER — Inpatient Hospital Stay (HOSPITAL_COMMUNITY)
Admission: EM | Admit: 2023-11-22 | Discharge: 2023-11-25 | DRG: 389 | Disposition: A | Discharge status: Home / Self Care | Attending: Internal Medicine | Admitting: Internal Medicine

## 2023-11-22 DIAGNOSIS — Z885 Allergy status to narcotic agent status: Secondary | ICD-10-CM

## 2023-11-22 DIAGNOSIS — Z79899 Other long term (current) drug therapy: Secondary | ICD-10-CM

## 2023-11-22 DIAGNOSIS — Z8379 Family history of other diseases of the digestive system: Secondary | ICD-10-CM

## 2023-11-22 DIAGNOSIS — E222 Syndrome of inappropriate secretion of antidiuretic hormone: Secondary | ICD-10-CM | POA: Diagnosis present

## 2023-11-22 DIAGNOSIS — Z8249 Family history of ischemic heart disease and other diseases of the circulatory system: Secondary | ICD-10-CM

## 2023-11-22 DIAGNOSIS — Z91013 Allergy to seafood: Secondary | ICD-10-CM

## 2023-11-22 DIAGNOSIS — I10 Essential (primary) hypertension: Secondary | ICD-10-CM | POA: Diagnosis present

## 2023-11-22 DIAGNOSIS — Z7951 Long term (current) use of inhaled steroids: Secondary | ICD-10-CM

## 2023-11-22 DIAGNOSIS — R1032 Left lower quadrant pain: Secondary | ICD-10-CM | POA: Diagnosis present

## 2023-11-22 DIAGNOSIS — M549 Dorsalgia, unspecified: Secondary | ICD-10-CM | POA: Diagnosis present

## 2023-11-22 DIAGNOSIS — Z8719 Personal history of other diseases of the digestive system: Secondary | ICD-10-CM

## 2023-11-22 DIAGNOSIS — Z9049 Acquired absence of other specified parts of digestive tract: Secondary | ICD-10-CM

## 2023-11-22 DIAGNOSIS — Z91040 Latex allergy status: Secondary | ICD-10-CM

## 2023-11-22 DIAGNOSIS — N179 Acute kidney failure, unspecified: Secondary | ICD-10-CM | POA: Diagnosis present

## 2023-11-22 DIAGNOSIS — E871 Hypo-osmolality and hyponatremia: Principal | ICD-10-CM | POA: Insufficient documentation

## 2023-11-22 DIAGNOSIS — E860 Dehydration: Secondary | ICD-10-CM | POA: Diagnosis present

## 2023-11-22 DIAGNOSIS — E785 Hyperlipidemia, unspecified: Secondary | ICD-10-CM | POA: Diagnosis present

## 2023-11-22 DIAGNOSIS — F411 Generalized anxiety disorder: Secondary | ICD-10-CM | POA: Diagnosis present

## 2023-11-22 DIAGNOSIS — E872 Acidosis, unspecified: Secondary | ICD-10-CM | POA: Diagnosis present

## 2023-11-22 DIAGNOSIS — Z888 Allergy status to other drugs, medicaments and biological substances status: Secondary | ICD-10-CM

## 2023-11-22 DIAGNOSIS — R109 Unspecified abdominal pain: Secondary | ICD-10-CM | POA: Diagnosis not present

## 2023-11-22 DIAGNOSIS — G8929 Other chronic pain: Secondary | ICD-10-CM | POA: Diagnosis present

## 2023-11-22 DIAGNOSIS — E876 Hypokalemia: Secondary | ICD-10-CM | POA: Diagnosis present

## 2023-11-22 DIAGNOSIS — Z886 Allergy status to analgesic agent status: Secondary | ICD-10-CM

## 2023-11-22 DIAGNOSIS — K5641 Fecal impaction: Secondary | ICD-10-CM | POA: Diagnosis not present

## 2023-11-22 DIAGNOSIS — M545 Low back pain, unspecified: Secondary | ICD-10-CM | POA: Diagnosis present

## 2023-11-22 DIAGNOSIS — Z803 Family history of malignant neoplasm of breast: Secondary | ICD-10-CM

## 2023-11-22 DIAGNOSIS — T43225A Adverse effect of selective serotonin reuptake inhibitors, initial encounter: Secondary | ICD-10-CM | POA: Diagnosis present

## 2023-11-22 DIAGNOSIS — J449 Chronic obstructive pulmonary disease, unspecified: Secondary | ICD-10-CM | POA: Diagnosis present

## 2023-11-22 DIAGNOSIS — F39 Unspecified mood [affective] disorder: Secondary | ICD-10-CM | POA: Diagnosis present

## 2023-11-22 DIAGNOSIS — Z8709 Personal history of other diseases of the respiratory system: Secondary | ICD-10-CM

## 2023-11-22 DIAGNOSIS — Z90710 Acquired absence of both cervix and uterus: Secondary | ICD-10-CM | POA: Insufficient documentation

## 2023-11-22 DIAGNOSIS — Z91030 Bee allergy status: Secondary | ICD-10-CM

## 2023-11-22 DIAGNOSIS — Z800 Family history of malignant neoplasm of digestive organs: Secondary | ICD-10-CM

## 2023-11-22 DIAGNOSIS — K219 Gastro-esophageal reflux disease without esophagitis: Secondary | ICD-10-CM | POA: Diagnosis present

## 2023-11-22 DIAGNOSIS — Z96651 Presence of right artificial knee joint: Secondary | ICD-10-CM | POA: Diagnosis present

## 2023-11-22 LAB — CBC
HCT: 45.3 % (ref 36.0–46.0)
Hemoglobin: 15.6 g/dL — ABNORMAL HIGH (ref 12.0–15.0)
MCH: 28.7 pg (ref 26.0–34.0)
MCHC: 34.4 g/dL (ref 30.0–36.0)
MCV: 83.4 fL (ref 80.0–100.0)
Platelets: 397 K/uL (ref 150–400)
RBC: 5.43 MIL/uL — ABNORMAL HIGH (ref 3.87–5.11)
RDW: 12.4 % (ref 11.5–15.5)
WBC: 6.4 K/uL (ref 4.0–10.5)
nRBC: 0.3 % — ABNORMAL HIGH (ref 0.0–0.2)

## 2023-11-22 LAB — COMPREHENSIVE METABOLIC PANEL WITH GFR
ALT: 17 U/L (ref 0–44)
AST: 31 U/L (ref 15–41)
Albumin: 3.2 g/dL — ABNORMAL LOW (ref 3.5–5.0)
Alkaline Phosphatase: 98 U/L (ref 38–126)
Anion gap: 19 — ABNORMAL HIGH (ref 5–15)
BUN: 20 mg/dL (ref 8–23)
CO2: 33 mmol/L — ABNORMAL HIGH (ref 22–32)
Calcium: 9.7 mg/dL (ref 8.9–10.3)
Chloride: 74 mmol/L — ABNORMAL LOW (ref 98–111)
Creatinine, Ser: 1.4 mg/dL — ABNORMAL HIGH (ref 0.44–1.00)
GFR, Estimated: 43 mL/min — ABNORMAL LOW (ref >60)
Glucose, Bld: 125 mg/dL — ABNORMAL HIGH (ref 70–99)
Potassium: 2.8 mmol/L — ABNORMAL LOW (ref 3.5–5.1)
Sodium: 126 mmol/L — ABNORMAL LOW (ref 135–145)
Total Bilirubin: 1.3 mg/dL — ABNORMAL HIGH (ref 0.0–1.2)
Total Protein: 6.8 g/dL (ref 6.5–8.1)

## 2023-11-22 LAB — I-STAT CHEM 8, ED
BUN: 21 mg/dL (ref 8–23)
Calcium, Ion: 1 mmol/L — ABNORMAL LOW (ref 1.15–1.40)
Chloride: 76 mmol/L — ABNORMAL LOW (ref 98–111)
Creatinine, Ser: 1.6 mg/dL — ABNORMAL HIGH (ref 0.44–1.00)
Glucose, Bld: 124 mg/dL — ABNORMAL HIGH (ref 70–99)
HCT: 47 % — ABNORMAL HIGH (ref 36.0–46.0)
Hemoglobin: 16 g/dL — ABNORMAL HIGH (ref 12.0–15.0)
Potassium: 2.4 mmol/L — CL (ref 3.5–5.1)
Sodium: 125 mmol/L — ABNORMAL LOW (ref 135–145)
TCO2: 37 mmol/L — ABNORMAL HIGH (ref 22–32)

## 2023-11-22 LAB — MAGNESIUM: Magnesium: 1.5 mg/dL — ABNORMAL LOW (ref 1.7–2.4)

## 2023-11-22 LAB — LIPASE, BLOOD: Lipase: 34 U/L (ref 11–51)

## 2023-11-22 MED ORDER — POTASSIUM CHLORIDE CRYS ER 20 MEQ PO TBCR
40.0000 meq | EXTENDED_RELEASE_TABLET | Freq: Once | ORAL | Status: AC
  Administered 2023-11-22: 40 meq via ORAL
  Filled 2023-11-22: qty 2

## 2023-11-22 MED ORDER — LACTATED RINGERS IV BOLUS
1000.0000 mL | Freq: Once | INTRAVENOUS | Status: AC
Start: 2023-11-22 — End: 2023-11-23
  Administered 2023-11-22: 1000 mL via INTRAVENOUS

## 2023-11-22 MED ORDER — POTASSIUM CHLORIDE 10 MEQ/100ML IV SOLN: 10.0000 meq | INTRAVENOUS | Status: DC

## 2023-11-22 MED ORDER — IOHEXOL 350 MG/ML SOLN
75.0000 mL | Freq: Once | INTRAVENOUS | Status: AC | PRN
  Administered 2023-11-22: 75 mL via INTRAVENOUS

## 2023-11-22 MED ORDER — OXYCODONE-ACETAMINOPHEN 5-325 MG PO TABS
1.0000 | ORAL_TABLET | Freq: Once | ORAL | Status: AC
  Administered 2023-11-22: 1 via ORAL
  Filled 2023-11-22: qty 1

## 2023-11-22 MED ORDER — HYDROMORPHONE HCL 1 MG/ML IJ SOLN
0.5000 mg | Freq: Once | INTRAMUSCULAR | Status: AC
  Administered 2023-11-23: 0.5 mg via INTRAVENOUS
  Filled 2023-11-22: qty 1

## 2023-11-22 MED ORDER — MAGNESIUM SULFATE IN D5W 1-5 GM/100ML-% IV SOLN
1.0000 g | Freq: Once | INTRAVENOUS | Status: AC
  Administered 2023-11-23: 1 g via INTRAVENOUS
  Filled 2023-11-22: qty 100

## 2023-11-22 NOTE — ED Triage Notes (Signed)
 BIB [facility] from home r/t LUQ pain with nausea but without diarrhea and vomiting. Pain started approx 3 hours ago. Pt given 4 mg Zofran  IM via EMS. Pt tachycardic. Pt was released from jail yuesterday.   BP 142/91 HR 114 Spo2 96% RA CBG 220

## 2023-11-22 NOTE — ED Provider Notes (Signed)
 [place] [facility]PARTMENT AT [facility] Provider Note   CSN: [id number] Arrival date & time: 11/22/23  2052     Patient presents with: Abdominal Pain   [name] is a 63 y.o. female.  {Add pertinent medical, surgical, social history, OB history to HPI:32947} This is a 63 year old female presenting emergency department for generalized abdominal pain.  Reports pain for the past 2 days acutely worsened today.  Was nauseated and had vomiting earlier.  Decreased p.o. intake secondary to her pain as she notes it feels similar to prior SBO that she has had in the past.  Has not had a bowel movement today, she is unsure when her last bowel movement was, does think she is passing gas.  No fevers or chills.   Abdominal Pain      Prior to Admission medications   Medication Sig Start Date End Date Taking? Authorizing Provider  albuterol  (VENTOLIN  HFA) 108 (90 Base) MCG/ACT inhaler Inhale 2 puffs into the lungs every 4 (four) hours as needed for wheezing or shortness of breath. 06/08/22   Dreama, Georgia  N, FNP  amLODipine  (NORVASC ) 10 MG tablet Take 10 mg by mouth daily.    [provider]  bacitracin  ointment Apply 1 Application topically 2 (two) times daily. 03/14/22   Claudene Lenis, NP  diclofenac  sodium (VOLTAREN ) 1 % GEL Apply 2 g topically 4 (four) times daily as needed (knee pain).     [provider]  diphenhydrAMINE  (BENADRYL ) 25 MG tablet Take 25 mg by mouth every 6 (six) hours as needed for allergies (allergies).    [provider]  esomeprazole (NEXIUM) 40 MG capsule Take 40 mg by mouth daily at 12 noon.    [provider]  fluticasone  (FLONASE ) 50 MCG/ACT nasal spray Place 1 spray into both nostrils daily. 06/08/22   Dreama, Georgia  N, FNP  Fluticasone -Salmeterol (ADVAIR) 500-50 MCG/DOSE AEPB Inhale 1 puff into the lungs 2 (two) times daily.    [provider]  gabapentin  (NEURONTIN ) 300 MG capsule Take 300 mg by  mouth 3 (three) times daily.    [provider]  HYDROcodone -acetaminophen  (NORCO/VICODIN) 5-325 MG tablet Take 1 tablet by mouth every 6 (six) hours as needed (pain). 03/18/22   Banister, Pamela K, MD  ipratropium (ATROVENT ) 0.06 % nasal spray Place 2 sprays into the nose 4 (four) times daily. 12/01/21   Teresa Carrier, MD  loratadine  (CLARITIN ) 10 MG tablet Take 10 mg by mouth daily.    [provider]  Multiple Vitamin (MULTIVITAMIN WITH MINERALS) TABS tablet Take 1 tablet by mouth daily.    [provider]  polyethylene glycol (MIRALAX  / GLYCOLAX ) packet Take 17 g by mouth daily as needed for mild constipation or moderate constipation (constipation).     [provider]  promethazine  (PHENERGAN ) 25 MG tablet Take 25 mg by mouth every 8 (eight) hours as needed for nausea or vomiting (nausea).  08/20/13   [provider]  rosuvastatin  (CRESTOR ) 10 MG tablet Take 10 mg by mouth daily.    [provider]  senna (SENOKOT) 8.6 MG TABS tablet Take 1 tablet (8.6 mg total) by mouth daily. 11/22/15   Abrol, Nayana, MD  sertraline  (ZOLOFT ) 100 MG tablet Take 100 mg by mouth daily.    [provider]  silver  sulfADIAZINE  (SILVADENE ) 1 % cream Apply 1 Application topically daily. 03/18/22   Banister, Pamela K, MD  tiotropium (SPIRIVA ) 18 MCG inhalation capsule Place 18 mcg into inhaler and inhale daily.  [provider]  traZODone  (DESYREL ) 100 MG tablet Take 100 mg by mouth at bedtime as needed for sleep.  06/09/14   [provider]    Allergies: Bee venom, Peanuts [peanut oil], Shellfish allergy, Aspirin, Corn-containing products, Ibuprofen, Latex, Morphine  and codeine, and Zofran  [ondansetron  hcl]    Review of Systems  Gastrointestinal:  Positive for abdominal pain.    Updated Vital Signs BP (!) 139/125   Pulse (!) 103   Temp 98.3 F (36.8 C)   Resp 13   Ht 5' 2 (1.575 m)   Wt 60.8 kg   SpO2 98%   BMI 24.51 kg/m    Physical Exam Vitals and nursing note reviewed.  Constitutional:      General: She is not in acute distress.    Appearance: She is obese. She is not toxic-appearing.  Cardiovascular:     Rate and Rhythm: Regular rhythm. Tachycardia present.  Abdominal:     General: Abdomen is flat.     Palpations: Abdomen is soft.     Tenderness: There is generalized abdominal tenderness.  Skin:    General: Skin is warm.     Capillary Refill: Capillary refill takes less than 2 seconds.  Neurological:     Mental Status: She is alert and oriented to person, place, and time.  Psychiatric:        Mood and Affect: Mood normal.        Behavior: Behavior normal.     (all labs ordered are listed, but only abnormal results are displayed) Labs Reviewed  CBC - Abnormal; Notable for the following components:      Result Value   RBC 5.43 (*)    Hemoglobin 15.6 (*)    nRBC 0.3 (*)    All other components within normal limits  COMPREHENSIVE METABOLIC PANEL WITH GFR - Abnormal; Notable for the following components:   Sodium 126 (*)    Potassium 2.8 (*)    Chloride 74 (*)    CO2 33 (*)    Glucose, Bld 125 (*)    Creatinine, Ser 1.40 (*)    Albumin 3.2 (*)    Total Bilirubin 1.3 (*)    GFR, Estimated 43 (*)    Anion gap 19 (*)    All other components within normal limits  I-STAT CHEM 8, ED - Abnormal; Notable for the following components:   Sodium 125 (*)    Potassium 2.4 (*)    Chloride 76 (*)    Creatinine, Ser 1.60 (*)    Glucose, Bld 124 (*)    Calcium , Ion 1.00 (*)    TCO2 37 (*)    Hemoglobin 16.0 (*)    HCT 47.0 (*)    All other components within normal limits  LIPASE, BLOOD  URINALYSIS, ROUTINE W REFLEX MICROSCOPIC  MAGNESIUM   SODIUM, URINE, RANDOM  OSMOLALITY, URINE    EKG: None  Radiology: DG Chest Portable 1 View Result Date: 11/22/2023 EXAM: 1 VIEW XRAY OF THE CHEST 11/22/2023 09:14:00 PM COMPARISON: Comparison with x-ray 06/08/2022. CLINICAL HISTORY: chest pain. Chest  pain chest pain. Chest pain FINDINGS: LUNGS AND PLEURA: No focal pulmonary opacity. No pulmonary edema. No pleural effusion. No pneumothorax. HEART AND MEDIASTINUM: No acute abnormality of the cardiac and mediastinal silhouettes. BONES AND SOFT TISSUES: No acute osseous abnormality. IMPRESSION: 1. No acute abnormalities. Electronically signed by: Norman Gatlin MD 11/22/2023 09:17 PM EDT RP Workstation: [id number]    {Document cardiac monitor, telemetry assessment procedure when appropriate:32947} Procedures   Medications Ordered  in the ED  potassium chloride  SA (KLOR-CON  M) CR tablet 40 mEq (has no administration in time range)  oxyCODONE -acetaminophen  (PERCOCET/ROXICET) 5-325 MG per tablet 1 tablet (1 tablet Oral Given 11/22/23 2133)  lactated ringers  bolus 1,000 mL (1,000 mLs Intravenous New Bag/Given 11/22/23 2133)    Clinical Course as of 11/22/23 2341  Tue Nov 22, 2023  2214 Comprehensive metabolic panel with GFR(!) Patient with evidence of severe dehydration with a sodium of 126, potassium of 2.8 and chloride of 74.  Appears to have an elevated creatinine. [TY]  2214 CBC(!) No leukocytosis to suggest systemic infection. [TY]  2301 CT ABDOMEN PELVIS W CONTRAST Large dilated loop of bowel on my independent review of images.  Concerning for SBO.  She is receiving IV fluids.  Will add on a lactic [TY]  2302 Continues to have pain.  Will give dose of IV Dilaudid  [TY]  2304 Last admitted in 2023 for SBO and per chart review: history of multiple prior abdominal surgeries including an abdominal hysterectomy in 2003 complicated by small bowel prolapse through vagina requiring an ex-lap with partial small bowel resection. She also has a history of SBO in 2002 requiring ex-lap with small bowel resection x2 complicated by abdominal compartment syndrome requiring laparotomy. [TY]    Clinical Course User Index [TY] Neysa Caron PARAS, DO   {Click here for ABCD2, HEART and other calculators REFRESH  Note before signing:1}                              Medical Decision Making This is a 63 year old female presenting emergency department for abdominal pain.  She is afebrile, mildly elevated heart rate, but normotensive.  Maintaining oxygen saturation on room air.  Largely reassuring abdominal exam, soft mild diffuse tenderness.  Complex past medical history to include does have a history of SBO, prior hysterectomy, COPD, hypertension, GERD.  Concern for possible SBO today given her diffuse pain, lack of bowel movements.  Will get broad screening labs and plan for CT scan.  Received Zofran  per EMS reports and has had improvement of her nausea.  Will give Percocet for pain.  See ED course for further MDM final disposition  Amount and/or Complexity of Data Reviewed Labs: ordered. Decision-making details documented in ED Course. Radiology: ordered.  Risk Prescription drug management.    {Document critical care time when appropriate  Document review of labs and clinical decision tools ie CHADS2VASC2, etc  Document your independent review of radiology images and any outside records  Document your discussion with family members, caretakers and with consultants  Document social determinants of health affecting pt's care  Document your decision making why or why not admission, treatments were needed:32947:::1}   Final diagnoses:  None    ED Discharge Orders     None

## 2023-11-23 ENCOUNTER — Encounter (HOSPITAL_COMMUNITY): Payer: Self-pay | Admitting: Internal Medicine

## 2023-11-23 DIAGNOSIS — Z96651 Presence of right artificial knee joint: Secondary | ICD-10-CM | POA: Diagnosis present

## 2023-11-23 DIAGNOSIS — Z886 Allergy status to analgesic agent status: Secondary | ICD-10-CM | POA: Diagnosis not present

## 2023-11-23 DIAGNOSIS — E872 Acidosis, unspecified: Secondary | ICD-10-CM | POA: Diagnosis present

## 2023-11-23 DIAGNOSIS — Z91013 Allergy to seafood: Secondary | ICD-10-CM | POA: Diagnosis not present

## 2023-11-23 DIAGNOSIS — E7849 Other hyperlipidemia: Secondary | ICD-10-CM

## 2023-11-23 DIAGNOSIS — Z90710 Acquired absence of both cervix and uterus: Secondary | ICD-10-CM | POA: Insufficient documentation

## 2023-11-23 DIAGNOSIS — F411 Generalized anxiety disorder: Secondary | ICD-10-CM | POA: Diagnosis present

## 2023-11-23 DIAGNOSIS — Z8379 Family history of other diseases of the digestive system: Secondary | ICD-10-CM

## 2023-11-23 DIAGNOSIS — Z8709 Personal history of other diseases of the respiratory system: Secondary | ICD-10-CM

## 2023-11-23 DIAGNOSIS — R1032 Left lower quadrant pain: Secondary | ICD-10-CM | POA: Diagnosis not present

## 2023-11-23 DIAGNOSIS — E871 Hypo-osmolality and hyponatremia: Secondary | ICD-10-CM | POA: Diagnosis not present

## 2023-11-23 DIAGNOSIS — Z91030 Bee allergy status: Secondary | ICD-10-CM | POA: Diagnosis not present

## 2023-11-23 DIAGNOSIS — E860 Dehydration: Secondary | ICD-10-CM | POA: Diagnosis present

## 2023-11-23 DIAGNOSIS — E785 Hyperlipidemia, unspecified: Secondary | ICD-10-CM | POA: Diagnosis present

## 2023-11-23 DIAGNOSIS — Z8719 Personal history of other diseases of the digestive system: Secondary | ICD-10-CM

## 2023-11-23 DIAGNOSIS — Z8249 Family history of ischemic heart disease and other diseases of the circulatory system: Secondary | ICD-10-CM | POA: Diagnosis not present

## 2023-11-23 DIAGNOSIS — M549 Dorsalgia, unspecified: Secondary | ICD-10-CM | POA: Diagnosis present

## 2023-11-23 DIAGNOSIS — I10 Essential (primary) hypertension: Secondary | ICD-10-CM | POA: Diagnosis present

## 2023-11-23 DIAGNOSIS — Z885 Allergy status to narcotic agent status: Secondary | ICD-10-CM | POA: Diagnosis not present

## 2023-11-23 DIAGNOSIS — N179 Acute kidney failure, unspecified: Secondary | ICD-10-CM | POA: Diagnosis present

## 2023-11-23 DIAGNOSIS — F39 Unspecified mood [affective] disorder: Secondary | ICD-10-CM | POA: Diagnosis present

## 2023-11-23 DIAGNOSIS — T43225A Adverse effect of selective serotonin reuptake inhibitors, initial encounter: Secondary | ICD-10-CM | POA: Diagnosis present

## 2023-11-23 DIAGNOSIS — E876 Hypokalemia: Secondary | ICD-10-CM | POA: Diagnosis present

## 2023-11-23 DIAGNOSIS — Z7951 Long term (current) use of inhaled steroids: Secondary | ICD-10-CM | POA: Diagnosis not present

## 2023-11-23 DIAGNOSIS — Z91040 Latex allergy status: Secondary | ICD-10-CM | POA: Diagnosis not present

## 2023-11-23 DIAGNOSIS — G8929 Other chronic pain: Secondary | ICD-10-CM | POA: Diagnosis present

## 2023-11-23 DIAGNOSIS — K219 Gastro-esophageal reflux disease without esophagitis: Secondary | ICD-10-CM | POA: Diagnosis present

## 2023-11-23 DIAGNOSIS — J449 Chronic obstructive pulmonary disease, unspecified: Secondary | ICD-10-CM | POA: Diagnosis present

## 2023-11-23 DIAGNOSIS — E222 Syndrome of inappropriate secretion of antidiuretic hormone: Secondary | ICD-10-CM | POA: Diagnosis present

## 2023-11-23 DIAGNOSIS — R109 Unspecified abdominal pain: Secondary | ICD-10-CM | POA: Diagnosis present

## 2023-11-23 DIAGNOSIS — K5641 Fecal impaction: Secondary | ICD-10-CM | POA: Diagnosis present

## 2023-11-23 LAB — I-STAT CG4 LACTIC ACID, ED: Lactic Acid, Venous: 2.6 mmol/L (ref 0.5–1.9)

## 2023-11-23 LAB — LACTIC ACID, PLASMA
Lactic Acid, Venous: 1.6 mmol/L (ref 0.5–1.9)
Lactic Acid, Venous: 2.3 mmol/L (ref 0.5–1.9)

## 2023-11-23 LAB — CBC
HCT: 36.2 % (ref 36.0–46.0)
Hemoglobin: 12.3 g/dL (ref 12.0–15.0)
MCH: 28.5 pg (ref 26.0–34.0)
MCHC: 34 g/dL (ref 30.0–36.0)
MCV: 83.8 fL (ref 80.0–100.0)
Platelets: 309 K/uL (ref 150–400)
RBC: 4.32 MIL/uL (ref 3.87–5.11)
RDW: 12.5 % (ref 11.5–15.5)
WBC: 6.3 K/uL (ref 4.0–10.5)
nRBC: 0 % (ref 0.0–0.2)

## 2023-11-23 LAB — COMPREHENSIVE METABOLIC PANEL WITH GFR
ALT: 12 U/L (ref 0–44)
AST: 22 U/L (ref 15–41)
Albumin: 2.4 g/dL — ABNORMAL LOW (ref 3.5–5.0)
Alkaline Phosphatase: 74 U/L (ref 38–126)
Anion gap: 17 — ABNORMAL HIGH (ref 5–15)
BUN: 15 mg/dL (ref 8–23)
CO2: 26 mmol/L (ref 22–32)
Calcium: 8.4 mg/dL — ABNORMAL LOW (ref 8.9–10.3)
Chloride: 82 mmol/L — ABNORMAL LOW (ref 98–111)
Creatinine, Ser: 0.82 mg/dL (ref 0.44–1.00)
GFR, Estimated: >60 mL/min (ref >60)
Glucose, Bld: 107 mg/dL — ABNORMAL HIGH (ref 70–99)
Potassium: 3.2 mmol/L — ABNORMAL LOW (ref 3.5–5.1)
Sodium: 125 mmol/L — ABNORMAL LOW (ref 135–145)
Total Bilirubin: 1 mg/dL (ref 0.0–1.2)
Total Protein: 5.7 g/dL — ABNORMAL LOW (ref 6.5–8.1)

## 2023-11-23 LAB — BASIC METABOLIC PANEL WITH GFR
Anion gap: 12 (ref 5–15)
Anion gap: 13 (ref 5–15)
BUN: 16 mg/dL (ref 8–23)
BUN: 14 mg/dL (ref 8–23)
CO2: 28 mmol/L (ref 22–32)
CO2: 29 mmol/L (ref 22–32)
Calcium: 8.3 mg/dL — ABNORMAL LOW (ref 8.9–10.3)
Calcium: 8.3 mg/dL — ABNORMAL LOW (ref 8.9–10.3)
Chloride: 83 mmol/L — ABNORMAL LOW (ref 98–111)
Chloride: 85 mmol/L — ABNORMAL LOW (ref 98–111)
Creatinine, Ser: 0.82 mg/dL (ref 0.44–1.00)
Creatinine, Ser: 0.84 mg/dL (ref 0.44–1.00)
GFR, Estimated: >60 mL/min (ref >60)
GFR, Estimated: >60 mL/min
Glucose, Bld: 107 mg/dL — ABNORMAL HIGH (ref 70–99)
Glucose, Bld: 97 mg/dL (ref 70–99)
Potassium: 3.2 mmol/L — ABNORMAL LOW (ref 3.5–5.1)
Potassium: 3.7 mmol/L (ref 3.5–5.1)
Sodium: 123 mmol/L — ABNORMAL LOW (ref 135–145)
Sodium: 127 mmol/L — ABNORMAL LOW (ref 135–145)

## 2023-11-23 LAB — OSMOLALITY: Osmolality: 263 mosm/kg — ABNORMAL LOW (ref 275–295)

## 2023-11-23 LAB — MAGNESIUM: Magnesium: 1.7 mg/dL (ref 1.7–2.4)

## 2023-11-23 LAB — OSMOLALITY, URINE: Osmolality, Ur: 598 mosm/kg (ref 300–900)

## 2023-11-23 LAB — SODIUM, URINE, RANDOM: Sodium, Ur: <30 mmol/L

## 2023-11-23 MED ORDER — SODIUM CHLORIDE 0.9 % IV SOLN: INTRAVENOUS | Status: DC

## 2023-11-23 MED ORDER — METOPROLOL SUCCINATE ER 25 MG PO TB24
25.0000 mg | ORAL_TABLET | Freq: Every day | ORAL | Status: DC
  Administered 2023-11-24 – 2023-11-25 (×2): 25 mg via ORAL
  Filled 2023-11-23 (×3): qty 1

## 2023-11-23 MED ORDER — POTASSIUM CHLORIDE CRYS ER 20 MEQ PO TBCR
40.0000 meq | EXTENDED_RELEASE_TABLET | Freq: Once | ORAL | Status: AC
  Administered 2023-11-23: 40 meq via ORAL
  Filled 2023-11-23: qty 2

## 2023-11-23 MED ORDER — SERTRALINE HCL 100 MG PO TABS
100.0000 mg | ORAL_TABLET | Freq: Every day | ORAL | Status: DC
  Administered 2023-11-23 – 2023-11-24 (×2): 100 mg via ORAL
  Filled 2023-11-23 (×2): qty 1

## 2023-11-23 MED ORDER — SODIUM CHLORIDE 0.9 % IV SOLN: 250.0000 mL | INTRAVENOUS | Status: AC | PRN

## 2023-11-23 MED ORDER — PANTOPRAZOLE SODIUM 40 MG PO TBEC
40.0000 mg | DELAYED_RELEASE_TABLET | Freq: Every day | ORAL | Status: DC
  Administered 2023-11-23 – 2023-11-25 (×3): 40 mg via ORAL
  Filled 2023-11-23 (×3): qty 1

## 2023-11-23 MED ORDER — BISACODYL 10 MG RE SUPP: 10.0000 mg | Freq: Once | RECTAL | Status: DC

## 2023-11-23 MED ORDER — POLYETHYLENE GLYCOL 3350 17 G PO PACK
17.0000 g | PACK | Freq: Every day | ORAL | Status: DC
  Administered 2023-11-23: 17 g via ORAL
  Filled 2023-11-23: qty 1

## 2023-11-23 MED ORDER — FLEET ENEMA RE ENEM: 1.0000 | ENEMA | Freq: Once | RECTAL | Status: DC

## 2023-11-23 MED ORDER — SODIUM CHLORIDE 0.9 % IV BOLUS: 1000.0000 mL | INTRAVENOUS | Status: DC

## 2023-11-23 MED ORDER — PROCHLORPERAZINE EDISYLATE 10 MG/2ML IJ SOLN
10.0000 mg | Freq: Once | INTRAMUSCULAR | Status: AC
  Administered 2023-11-23: 10 mg via INTRAVENOUS
  Filled 2023-11-23: qty 2

## 2023-11-23 MED ORDER — PROMETHAZINE (PHENERGAN) 6.25MG IN NS 50ML IVPB
6.2500 mg | Freq: Once | INTRAVENOUS | Status: AC
  Administered 2023-11-23: 6.25 mg via INTRAVENOUS
  Filled 2023-11-23: qty 6.25

## 2023-11-23 MED ORDER — DOCUSATE SODIUM 50 MG/5ML PO LIQD
100.0000 mg | Freq: Two times a day (BID) | ORAL | Status: DC
  Administered 2023-11-23 (×3): 100 mg via ORAL
  Filled 2023-11-23 (×3): qty 10

## 2023-11-23 MED ORDER — POLYETHYLENE GLYCOL 3350 17 G PO PACK
17.0000 g | PACK | Freq: Two times a day (BID) | ORAL | Status: DC
  Administered 2023-11-23 (×2): 17 g via ORAL
  Filled 2023-11-23 (×4): qty 1

## 2023-11-23 MED ORDER — BISACODYL 5 MG PO TBEC
10.0000 mg | DELAYED_RELEASE_TABLET | Freq: Every day | ORAL | Status: DC
  Administered 2023-11-23: 10 mg via ORAL
  Filled 2023-11-23 (×3): qty 2

## 2023-11-23 MED ORDER — SODIUM CHLORIDE 0.9% FLUSH
3.0000 mL | Freq: Two times a day (BID) | INTRAVENOUS | Status: DC
  Administered 2023-11-23 – 2023-11-24 (×3): 3 mL via INTRAVENOUS

## 2023-11-23 MED ORDER — POTASSIUM CHLORIDE 10 MEQ/100ML IV SOLN
10.0000 meq | INTRAVENOUS | Status: AC
  Administered 2023-11-23 (×5): 10 meq via INTRAVENOUS
  Filled 2023-11-23 (×5): qty 100

## 2023-11-23 MED ORDER — POTASSIUM CHLORIDE IN NACL 40-0.9 MEQ/L-% IV SOLN
INTRAVENOUS | Status: DC
  Filled 2023-11-23 (×2): qty 1000

## 2023-11-23 MED ORDER — ENOXAPARIN SODIUM 40 MG/0.4ML IJ SOSY
40.0000 mg | PREFILLED_SYRINGE | INTRAMUSCULAR | Status: DC
Start: 2023-11-23 — End: 2023-11-25
  Administered 2023-11-23 – 2023-11-24 (×2): 40 mg via SUBCUTANEOUS
  Filled 2023-11-23 (×2): qty 0.4

## 2023-11-23 MED ORDER — ROSUVASTATIN CALCIUM 5 MG PO TABS
10.0000 mg | ORAL_TABLET | Freq: Every day | ORAL | Status: DC
  Administered 2023-11-23 – 2023-11-25 (×3): 10 mg via ORAL
  Filled 2023-11-23 (×3): qty 2

## 2023-11-23 MED ORDER — METOPROLOL SUCCINATE ER 25 MG PO TB24: 25.0000 mg | ORAL_TABLET | Freq: Every day | ORAL | Status: DC

## 2023-11-23 MED ORDER — IPRATROPIUM-ALBUTEROL 0.5-2.5 (3) MG/3ML IN SOLN: 3.0000 mL | Freq: Four times a day (QID) | RESPIRATORY_TRACT | Status: DC | PRN

## 2023-11-23 MED ORDER — FLUTICASONE FUROATE-VILANTEROL 100-25 MCG/ACT IN AEPB
1.0000 | INHALATION_SPRAY | Freq: Every day | RESPIRATORY_TRACT | Status: DC
Start: 2023-11-23 — End: 2023-11-25
  Administered 2023-11-25: 1 via RESPIRATORY_TRACT
  Filled 2023-11-23 (×3): qty 28

## 2023-11-23 MED ORDER — SODIUM CHLORIDE 0.9% FLUSH
3.0000 mL | Freq: Two times a day (BID) | INTRAVENOUS | Status: DC
  Administered 2023-11-23 (×2): 3 mL via INTRAVENOUS

## 2023-11-23 MED ORDER — SODIUM CHLORIDE 0.9% FLUSH: 3.0000 mL | INTRAVENOUS | Status: DC | PRN

## 2023-11-23 MED ORDER — HYDROMORPHONE HCL 1 MG/ML IJ SOLN: 0.5000 mg | INTRAMUSCULAR | Status: DC | PRN

## 2023-11-23 MED ORDER — AMLODIPINE BESYLATE 5 MG PO TABS: 5.0000 mg | ORAL_TABLET | Freq: Every day | ORAL | Status: DC

## 2023-11-23 NOTE — H&P (Addendum)
 History and Physical    [name] FMW:[id number] DOB: 1960/05/01 DOA: 11/22/2023  PCP: Delores Rojelio Caldron, NP   Patient coming from: Home   Chief Complaint:  Chief Complaint  Patient presents with   Abdominal Pain   ED TRIAGE note:BIB [facility] from home r/t LUQ pain with nausea but without diarrhea and vomiting. Pain started approx 3 hours ago. Pt given 4 mg Zofran  IM via EMS. Pt tachycardic. Pt was released from jail yuesterday.    BP 142/91 HR 114 Spo2 96% RA CBG 220  HPI:  [name] is a 63 y.o. female with medical history significant of COPD/asthma, essential hypertension, GERD, chronic lower back pain secondary to osteoarthritis, history of abdominal hysterectomy complicated by small bowel prolapse through the vagina requiring ex lap with partial small bowel resection 2003, history of SBO 2002 required ex lap with small bowel resection secondary to complicated abdominal compartment syndrome, history of laparoscopic ventral hernia repair in 2012 at [facility] in [place] presented to emergency department complaining of left lower quadrant abdominal pain with associated nausea.Patient denies any fever, chill, vomiting and diarrhea.  Patient reported intermittent crampy left lower quadrant abdominal pain.  Unable to pass flatus for last 24 hours and reported no bowel movement for last 3 days.  Reported feeling nauseated no vomiting.  However she is able to eat small meal without any vomiting.  Patient does not have any other complaint at this time.  ED Course:  At presentation to ED patient is hemodynamically stable.  Afebrile.  Lab work, elevated lactic acid 2.6 trending down to 2.3. CBC unremarkable no evidence of leukocytosis stable H&H normal platelet count. CMP showing low sodium 126, low potassium 2.8, low chloride 74, elevated creatinine 1.4, low albumin 3.2, elevated bilirubin 1.3, low GFR 43 and elevated anion gap 19.  Low mag level 1.5.   CT abdomen pelvis  showing: IMPRESSION: 1. Chronically dilated fluid-filled small bowel within the anterior abdomen and pelvis as was seen on previous exams. No convincing evidence for acute bowel obstruction. 2. Gallstones. 3. Aortic atherosclerosis.  Chest x-ray unremarkable.  In the ED patient received 1 L of LR bolus, IV potassium 10 mEq x 2, oral potassium 40, mag sulfate 1 g oral oxycodone  40 and IV Dilaudid .  Patient has small bowel obstruction with similar CT finding in the past.  Consulted general surgery Dr. Polly first to review the imaging.     Hospitalist has been consulted for further evaluation management of acute on chronic abdominal pain, hypokalemia, hyponatremia, hypomagnesemia, AKI and lactic acidosis.  Update, discussed case with general surgery Dr. Polly.  Reviewed the imaging and per general surgery bowel pattern looks about the same dating back to 2018 so there is at least some chronic component. Her stomach is decompressed so there is no requirement for NGT.  Patient also has  fair amount of stool in her colon so recommended to try bowel regimen oral and pararectal to keep up with bowel movement.per general surgery clear liquid diet would would be fine if patient can tolerate.  General surgery will see patient early in the morning.     Significant labs in the ED: Lab Orders         Lipase, blood         CBC         Urinalysis, Routine w reflex microscopic -Urine, Clean Catch         Comprehensive metabolic panel with GFR  Magnesium          Sodium, urine, random         Osmolality, urine         Lactic acid, plasma         Basic metabolic panel         Osmolality, urine         Sodium, urine, random         Osmolality         HIV Antibody (routine testing w rflx)         CBC         Comprehensive metabolic panel         Lactic acid, plasma         Magnesium          I-stat chem 8, ED (not at [facility], [facility] or [facility])       Review of Systems:  Review of Systems   Constitutional:  Negative for chills, fever, malaise/fatigue and weight loss.  Respiratory:  Negative for cough.   Cardiovascular:  Negative for chest pain and palpitations.  Gastrointestinal:  Positive for abdominal pain, constipation and nausea. Negative for blood in stool, diarrhea, heartburn, melena and vomiting.  Neurological:  Negative for dizziness.  Psychiatric/Behavioral:  The patient is not nervous/anxious.     Past Medical History:  Diagnosis Date   Asthma    Cataract    Chronic bronchitis (HCC)    Chronic lower back pain    COPD (chronic obstructive pulmonary disease) (HCC)    Depression    GERD (gastroesophageal reflux disease)    Heart murmur    History of blood transfusion 2003-2004   related to bowel obstruction OR   Hyperlipidemia    Hypertension    Insomnia    Osteoarthritis    lower back; both hips; right leg (08/01/2014)   SBO (small bowel obstruction) (HCC) 10/2015    Past Surgical History:  Procedure Laterality Date   ABDOMINAL EXPLORATION SURGERY  2004   abdominal compartment syndrome   ABDOMINAL EXPLORATION SURGERY  2003   small bowel prolapse through vagina   ABDOMINAL HYSTERECTOMY  2003   ADENOIDECTOMY  2002   BOWEL RESECTION  2003; 2004   x2 with primary anastamosis   COMBINED HYSTEROSCOPY DIAGNOSTIC / D&C  ~ 2003   HERNIA REPAIR     JOINT REPLACEMENT     LAPAROSCOPIC INCISIONAL / UMBILICAL / VENTRAL HERNIA REPAIR  2012   [facility], Dr. Adelia   TONSILLECTOMY  1977   TOTAL KNEE ARTHROPLASTY Right 03/2012   [facility]     reports that she has never smoked. She has never used smokeless tobacco. She reports that she does not drink alcohol and does not use drugs.  Allergies  Allergen Reactions   Bee Venom Anaphylaxis and Itching   Peanuts [Peanut Oil] Anaphylaxis and Itching   Shellfish Allergy Anaphylaxis and Itching   Aspirin Itching    Bronchospasm    Corn-Containing Products Itching   Ibuprofen Itching   Latex Itching   Morphine  And  Codeine Itching   Zofran  [Ondansetron  Hcl] Itching and Rash    Family History  Problem Relation Age of Onset   Breast cancer Mother    Cancer Mother    Heart attack Father    Colon cancer Maternal Grandmother    Colon cancer Paternal Grandmother     Prior to Admission medications   Medication Sig Start Date End Date Taking? Authorizing Provider  albuterol  (VENTOLIN  HFA) 108 (90 Base) MCG/ACT inhaler  Inhale 2 puffs into the lungs every 4 (four) hours as needed for wheezing or shortness of breath. 06/08/22  Yes Garrison, [place]  N, FNP  amLODipine  (NORVASC ) 5 MG tablet Take 5 mg by mouth daily. 11/01/23  Yes [provider]  diclofenac  sodium (VOLTAREN ) 1 % GEL Apply 2 g topically 4 (four) times daily as needed (knee pain).    Yes [provider]  diphenhydrAMINE  (BENADRYL ) 25 MG tablet Take 25 mg by mouth every 6 (six) hours as needed for allergies (allergies).   Yes [provider]  esomeprazole (NEXIUM) 40 MG capsule Take 40 mg by mouth daily at 12 noon.   Yes [provider]  fluticasone  (FLONASE ) 50 MCG/ACT nasal spray Place 1 spray into both nostrils daily. 06/08/22  Yes Garrison, [place]  N, FNP  fluticasone -salmeterol (ADVAIR) 100-50 MCG/ACT AEPB Inhale 1 puff into the lungs 2 (two) times daily. 10/31/23  Yes [provider]  furosemide (LASIX) 20 MG tablet Take 20 mg by mouth daily. 10/31/23  Yes [provider]  gabapentin  (NEURONTIN ) 300 MG capsule Take 300 mg by mouth 3 (three) times daily.   Yes [provider]  lactulose (CHRONULAC) 10 GM/15ML solution Take 20 g by mouth daily. 11/07/23  Yes [provider]  losartan (COZAAR) 50 MG tablet Take 50 mg by mouth daily. 10/31/23  Yes [provider]  metoprolol succinate (TOPROL-XL) 25 MG 24 hr tablet Take 25 mg by mouth daily. 11/02/23  Yes [provider]  Multiple Vitamin (MULTIVITAMIN WITH MINERALS) TABS tablet Take 1 tablet by mouth daily.   Yes  [provider]  polyethylene glycol (MIRALAX  / GLYCOLAX ) packet Take 17 g by mouth daily as needed for mild constipation or moderate constipation (constipation).    Yes [provider]  potassium chloride  (KLOR-CON  M) 10 MEQ tablet Take 10 mEq by mouth daily. 10/31/23  Yes [provider]  promethazine  (PHENERGAN ) 25 MG tablet Take 25 mg by mouth every 8 (eight) hours as needed for nausea or vomiting (nausea).  08/20/13  Yes [provider]  rosuvastatin  (CRESTOR ) 10 MG tablet Take 10 mg by mouth daily.   Yes [provider]  senna (SENOKOT) 8.6 MG TABS tablet Take 1 tablet (8.6 mg total) by mouth daily. Patient taking differently: Take 1 tablet by mouth daily as needed for mild constipation. 11/22/15  Yes Abrol, Nayana, MD  sertraline  (ZOLOFT ) 100 MG tablet Take 100 mg by mouth daily.   Yes [provider]  SUMAtriptan (IMITREX) 25 MG tablet Take 25 mg by mouth every 2 (two) hours as needed for migraine or headache. 10/31/23  Yes [provider]     Physical Exam: Vitals:   11/23/23 0041 11/23/23 0114 11/23/23 0200 11/23/23 0206  BP:  119/71 110/76   Pulse:  (!) 105 95   Resp:  16 13   Temp: 98.3 F (36.8 C) 98.1 F (36.7 C)  98.1 F (36.7 C)  TempSrc: Oral Oral  Oral  SpO2:  97% 100%   Weight:      Height:        Physical Exam Vitals and nursing note reviewed.  Constitutional:      Appearance: She is ill-appearing.  Cardiovascular:     Rate and Rhythm: Regular rhythm. Tachycardia present.  Abdominal:     General: Abdomen is flat. Bowel sounds are normal. There is no distension.     Palpations: Abdomen is soft. There is no mass.     Tenderness: There is abdominal  tenderness in the left lower quadrant. There is no guarding or rebound.     Hernia: No hernia is present.  Skin:    Capillary Refill: Capillary refill takes less than 2 seconds.  Neurological:     Mental Status: She is alert and oriented to person, place,  and time.  Psychiatric:        Mood and Affect: Mood normal.      Labs on Admission: I have personally reviewed following labs and imaging studies  CBC: Recent Labs  Lab 11/22/23 2113 11/22/23 2141  WBC 6.4  --   HGB 15.6* 16.0*  HCT 45.3 47.0*  MCV 83.4  --   PLT 397  --    Basic Metabolic Panel: Recent Labs  Lab 11/22/23 2113 11/22/23 2141  NA 126* 125*  K 2.8* 2.4*  CL 74* 76*  CO2 33*  --   GLUCOSE 125* 124*  BUN 20 21  CREATININE 1.40* 1.60*  CALCIUM  9.7  --   MG 1.5*  --    GFR: Estimated Creatinine Clearance: 31.3 mL/min (A) (by C-G formula based on SCr of 1.6 mg/dL (H)). Liver Function Tests: Recent Labs  Lab 11/22/23 2113  AST 31  ALT 17  ALKPHOS 98  BILITOT 1.3*  PROT 6.8  ALBUMIN 3.2*   Recent Labs  Lab 11/22/23 2113  LIPASE 34   No results for input(s): AMMONIA in the last 168 hours. Coagulation Profile: No results for input(s): INR, PROTIME in the last 168 hours. Cardiac Enzymes: No results for input(s): CKTOTAL, CKMB, CKMBINDEX, TROPONINI, TROPONINIHS in the last 168 hours. BNP (last 3 results) No results for input(s): BNP in the last 8760 hours. HbA1C: No results for input(s): HGBA1C in the last 72 hours. CBG: No results for input(s): GLUCAP in the last 168 hours. Lipid Profile: No results for input(s): CHOL, HDL, LDLCALC, TRIG, CHOLHDL, LDLDIRECT in the last 72 hours. Thyroid Function Tests: No results for input(s): TSH, T4TOTAL, FREET4, T3FREE, THYROIDAB in the last 72 hours. Anemia Panel: No results for input(s): VITAMINB12, FOLATE, FERRITIN, TIBC, IRON, RETICCTPCT in the last 72 hours. Urine analysis:    Component Value Date/Time   COLORURINE STRAW (A) 10/01/2023 2000   APPEARANCEUR CLEAR 10/01/2023 2000   LABSPEC 1.010 10/01/2023 2000   PHURINE 5.0 10/01/2023 2000   GLUCOSEU NEGATIVE 10/01/2023 2000   HGBUR NEGATIVE 10/01/2023 2000   BILIRUBINUR NEGATIVE 10/01/2023  2000   KETONESUR NEGATIVE 10/01/2023 2000   PROTEINUR NEGATIVE 10/01/2023 2000   UROBILINOGEN 1.0 07/31/2014 0000   NITRITE NEGATIVE 10/01/2023 2000   LEUKOCYTESUR NEGATIVE 10/01/2023 2000    Radiological Exams on Admission: I have personally reviewed images CT ABDOMEN PELVIS W CONTRAST Result Date: 11/22/2023 CLINICAL DATA:  Possible bowel obstruction left upper quadrant pain with nausea EXAM: CT ABDOMEN AND PELVIS WITH CONTRAST TECHNIQUE: Multidetector CT imaging of the abdomen and pelvis was performed using the standard protocol following bolus administration of intravenous contrast. RADIATION DOSE REDUCTION: This exam was performed according to the departmental dose-optimization program which includes automated exposure control, adjustment of the mA and/or kV according to patient size and/or use of iterative reconstruction technique. CONTRAST:  75mL OMNIPAQUE  IOHEXOL  350 MG/ML SOLN COMPARISON:  CT 10/01/2023, 11/27/2021, 02/27/2016 FINDINGS: Lower chest: Lung bases demonstrate no acute airspace disease. Hepatobiliary: Gallstones. No focal hepatic abnormality or biliary dilatation. Pancreas: Unremarkable. No pancreatic ductal dilatation or surrounding inflammatory changes. Spleen: Normal in size without focal abnormality. Adrenals/Urinary Tract: Adrenal glands within normal limits. Kidneys show no hydronephrosis. Poor excretion  of contrast on delayed views. The bladder is unremarkable. Stomach/Bowel: Stomach decompressed. Chronically dilated fluid-filled small bowel within the anterior abdomen and pelvis as was seen on previous exam. Elsewhere, no dilated bowel or bowel wall thickening. Moderate stool burden. Moderate rectal feces. Vascular/Lymphatic: Aortic atherosclerosis. No enlarged abdominal or pelvic lymph nodes. Reproductive: No adnexal mass Other: Negative for pelvic effusion or free air. Musculoskeletal: No acute or suspicious osseous abnormality IMPRESSION: 1. Chronically dilated fluid-filled  small bowel within the anterior abdomen and pelvis as was seen on previous exams. No convincing evidence for acute bowel obstruction. 2. Gallstones. 3. Aortic atherosclerosis. Aortic Atherosclerosis (ICD10-I70.0). Electronically Signed   By: Luke Bun M.D.   On: 11/22/2023 23:54   DG Chest Portable 1 View Result Date: 11/22/2023 EXAM: 1 VIEW XRAY OF THE CHEST 11/22/2023 09:14:00 PM COMPARISON: Comparison with x-ray 06/08/2022. CLINICAL HISTORY: chest pain. Chest pain chest pain. Chest pain FINDINGS: LUNGS AND PLEURA: No focal pulmonary opacity. No pulmonary edema. No pleural effusion. No pneumothorax. HEART AND MEDIASTINUM: No acute abnormality of the cardiac and mediastinal silhouettes. BONES AND SOFT TISSUES: No acute osseous abnormality. IMPRESSION: 1. No acute abnormalities. Electronically signed by: Norman Gatlin MD 11/22/2023 09:17 PM EDT RP Workstation: [id number]     EKG: My personal interpretation of EKG shows: Sinus tachycardia heart rate 113, left atrial enlargement.  Nonspecific T wave abnormality.    Assessment/Plan: Principal Problem:   Left lower quadrant abdominal pain Active Problems:   Hypokalemia   GERD (gastroesophageal reflux disease)   Hyperlipidemia   Essential hypertension   Chronic lower back pain   Hyponatremia   Hypomagnesemia   Lactic acidosis   History of COPD   History of small bowel obstruction   History of hysterectomy   Generalized anxiety disorder    Assessment and Plan: Left lower quadrant abdominal pain History of recurrent SBO History of extensive abdominal surgery Constipation with moderate stool burden History of small bowel prolapse through vaginal requiring ex lap with partial small bowel resection 2003 History of SBO in 2002 required ex lap with small bowel resection secondary to complicated abdominal compartment syndrome History of laparoscopic ventral hernia repair 2012 -Patient has extensive abdominal surgery.  Presented to  emergency department complaining of left lower quadrant abdominal pain with associated nausea. Patient reported unable to pass gas for last 24 hours and did not have any bowel movement for last 3 days. - Hemodynamically stable.  CBC unremarkable.  CMP showing hyponatremia, hypokalemia, evidence of AKI.  Initial lactic acid 2.6 trended down to 2.3 after 1 L of LR bolus in the ED. - CT abdomen pelvis showing Chronically dilated fluid-filled small bowel within the anterior abdomen and pelvis as was seen on previous exams. No convincing evidence for acute bowel obstruction. -Physical exam no acute abdominal sign, rebound tenderness or rigidity.  Except patient has left lower quadrant abdominal pain. -Consulted and discussed case with general surgery Dr. Polly.  Reviewed the imaging and per general surgery bowel pattern looks about the same dating back to 2018 so there is at least some chronic component. Her stomach is decompressed so there is no requirement for NGT.  Patient also has  fair amount of stool in her colon so recommended to try bowel regimen oral and pararectal to keep up with bowel movement.per general surgery clear liquid diet would would be fine if patient can tolerate.  General surgery will see patient early in the morning. -Continue aggressive bowel regimen to relieve constipation.  Continue lactulose daily, docusate twice  daily and giving sodium phosphate  enema once. -Continue clear liquid diet. -Continue serial abdominal exam every 4 hours and if patient develop any acute abdominal sign need to obtain stat CT abdomen. -Continue to replete electrolytes which will improve bowel motility as well. Update, patient declining Fleet enema.  Continue MiraLAX  and docusate.   Hyponatremia -Patient reported that she has drinking excessive water and ice chips at home.  However physical exam patient is euvolemic. - Serum sodium 125.  In the ED patient received 1 L of LR bolus. - Starting  maintenance fluid NS 75 cc/h.  Goal to improve serum sodium level 8 to 10 mEq over the course of next 24 hours - Checking urine sodium, urine osmolarity and serum osmolarity -Continue to check BMP every 6 hours.  Hypokalemia -Low potassium 2.4.  In the ED patient received IV KCl 20 mEq oral oral KCl 40.  Initial potassium was 2.8 trended down to 2.4.  Giving further IV KCl total 60 mEq.  Hypomagnesemia -Low mag 1.5.  Repleted with IV mag sulfate.  Acute kidney injury -Elevated creatinine 1.6.  Prerenal acute kidney injury in the setting of poor oral intake from left lower quadrant abdominal pain. -Continue maintenance fluid NS 75 cc/h.  Monitor urine output.  Avoid nephrotoxic agent.  Monitor renal function   Elevated lactic acidosis -Initial lactate 2.6 improved to two 2.3.  At this time there is no concern sepsis and mesenteric ischemia. -Type B lactic acidosis in the setting of dehydration and AKI. Continue to trend  History of COPD and asthma both? -Stable.  Continue Breo Ellipta  daily and continue DuoNeb as needed  History of GERD -Continue oral Protonix    Essential hypertension History of chronic sinus tachycardia - Continue Toprol-XL 25 mg daily.  Given blood pressure is borderline soft holding oral amlodipine .  Hyperlipidemia -Continue Crestor   Generalized anxiety disorder -Continue Zoloft    DVT prophylaxis:  Lovenox  Code Status:  Full Code Diet: Clear liquid diet.  Advance diet as patient tolerates  Disposition Plan: Continue serial abdominal exam if patient develops any acute abdominal send need to obtain serial CT abdomen Consults: General Surgery Admission status:   Inpatient, progressive unit  Severity of Illness: The appropriate patient status for this patient is INPATIENT. Inpatient status is judged to be reasonable and necessary in order to provide the required intensity of service to ensure the patient's safety. The patient's presenting symptoms,  physical exam findings, and initial radiographic and laboratory data in the context of their chronic comorbidities is felt to place them at high risk for further clinical deterioration. Furthermore, it is not anticipated that the patient will be medically stable for discharge from the hospital within 2 midnights of admission.   * I certify that at the point of admission it is my clinical judgment that the patient will require inpatient hospital care spanning beyond 2 midnights from the point of admission due to high intensity of service, high risk for further deterioration and high frequency of surveillance required.DEWAINE    Danitra Payano, MD [facility]  How to contact the [facility] Attending or Consulting provider 7A - 7P or covering provider during after hours 7P -7A, for this patient.  Check the care team in [facility] and look for a) attending/consulting [facility] provider listed and b) the [facility] team listed Log into [email or web address] and use [place]'s universal password to access. If you do not have the password, please contact the hospital operator. Locate the [facility] provider you are looking for under [facility]  and page to a number that you can be directly reached. If you still have difficulty reaching the provider, please page the [facility] (Director on Call) for the Hospitalists listed on amion for assistance.  11/23/2023, 2:15 AM

## 2023-11-23 NOTE — ED Notes (Signed)
 Patient used bedside toilet and we wasn't able to get a clean urine. Patient had a bowel movement also.

## 2023-11-23 NOTE — Progress Notes (Signed)
 63 year old female with a history of COPD/asthma GERD chronic low back pain hysterectomy hypertension abdominal hysterectomy complicated with small bowel prolapsed vagina requiring exploratory laparotomy partial SBO resection in 2003 recurrent small bowel obstruction and complicated abdominal compartment syndrome ventral hernia repair released from jail recently admitted with nausea and left upper quadrant pain.  Denied vomiting or diarrhea.  She was found to have chronically dilated fluid-filled small bowel within the anterior abdomen and pelvis no convincing evidence of acute bowel obstruction, she has gallstones and atherosclerosis in the aorta.  Labs were significant for hyponatremia severe hypokalemia.  Seen by surgery.  Potassium being repleted aggressively.  Stat labs for this morning ordered for repeat potassium check.  Continue IV fluids with potassium.  Started Dulcolax senna and MiraLAX  and Dulcolax suppository.  Follow-up labs in AM.

## 2023-11-23 NOTE — ED Notes (Signed)
 Unable to draw labs x 5 attempts. Md aware NNO

## 2023-11-23 NOTE — ED Notes (Signed)
 I tried to get patient blood I did not have any success.

## 2023-11-23 NOTE — Consult Note (Signed)
 [name] 02/29/1960  [id number].    Requesting MD: Will, MD Chief Complaint/Reason for Consult: abdominal pain   HPI:  [name] is a 63 y/o F with PMH COPD, asthma, HTN, GERD, arthritis, and multiple abdominal surgeries who presents with LUQ abdominal pain. She tells me that the pain started about one day ago. And described as pain at my left ribcage. Associated symptoms include decreased flatus and constipation. She tells me that she is passing some gas but has been less in the last 5-6 days. She tells me that her last substantial BM was 6 days ago. At baseline she takes miralax  BID to avoid constipation. She reports chronic nausea, for which she takes phenergan . She denies, fever, chills, vomiting, melena, or hematochezia.   Multiple past abdominal surgeries: abdominal hysterectomy 2003 resulting in small bowel prolapse through vagina resulting in ex lap, SBR. SBO in 2002 >> ex lap SBR x2, abdominal compartment syndrome >> laparotomy, hx EC fistula - now healed, hx laparoscopic ventral hernia repair 2012 at [facility] in [place]   ROS: Review of Systems  All other systems reviewed and are negative.   Family History  Problem Relation Age of Onset   Breast cancer Mother    Cancer Mother    Heart attack Father    Colon cancer Maternal Grandmother    Colon cancer Paternal Grandmother     Past Medical History:  Diagnosis Date   Asthma    Cataract    Chronic bronchitis (HCC)    Chronic lower back pain    COPD (chronic obstructive pulmonary disease) (HCC)    Depression    GERD (gastroesophageal reflux disease)    Heart murmur    History of blood transfusion 2003-2004   related to bowel obstruction OR   Hyperlipidemia    Hypertension    Insomnia    Osteoarthritis    lower back; both hips; right leg (08/01/2014)   SBO (small bowel obstruction) (HCC) 10/2015    Past Surgical History:  Procedure Laterality Date   ABDOMINAL EXPLORATION SURGERY  2004    abdominal compartment syndrome   ABDOMINAL EXPLORATION SURGERY  2003   small bowel prolapse through vagina   ABDOMINAL HYSTERECTOMY  2003   ADENOIDECTOMY  2002   BOWEL RESECTION  2003; 2004   x2 with primary anastamosis   COMBINED HYSTEROSCOPY DIAGNOSTIC / D&C  ~ 2003   HERNIA REPAIR     JOINT REPLACEMENT     LAPAROSCOPIC INCISIONAL / UMBILICAL / VENTRAL HERNIA REPAIR  2012   [facility], Dr. Adelia   TONSILLECTOMY  1977   TOTAL KNEE ARTHROPLASTY Right 03/2012   [facility]    Social History:  reports that she has never smoked. She has never used smokeless tobacco. She reports that she does not drink alcohol and does not use drugs.  Allergies:  Allergies  Allergen Reactions   Bee Venom Anaphylaxis and Itching   Peanuts [Peanut Oil] Anaphylaxis and Itching   Shellfish Allergy Anaphylaxis and Itching   Aspirin Itching    Bronchospasm    Corn-Containing Products Itching   Ibuprofen Itching   Latex Itching   Morphine  And Codeine Itching   Zofran  [Ondansetron  Hcl] Itching and Rash    (Not in a hospital admission)    Physical Exam: Blood pressure 113/85, pulse 85, temperature 98.8 F (37.1 C), resp. rate 15, height 5' 2 (1.575 m), weight 60.8 kg, SpO2 100%. General: Pleasant black female, laying on hospital bed, appears stated age, NAD. HEENT:  head -normocephalic, atraumatic; Eyes: PERRLA, no conjunctival injection Neck- Trachea is midline CV- RRR, normal S1/S2, no M/R/G Pulm- breathing is non-labored.  Abd- soft, overall non-tender, mild LUQ tenderness without guarding or rebound tenderness  GU- deferred  MSK- UE/LE symmetrical, no cyanosis, clubbing, or edema. Neuro- non-focal exam  Psych- Alert and Oriented x3 with appropriate affect Skin: warm and dry, no rashes or lesions   Results for orders placed or performed during the hospital encounter of 11/22/23 (from the past 48 hours)  Lipase, blood     Status: None   Collection Time: 11/22/23  9:13 PM  Result Value Ref Range    Lipase 34 11 - 51 U/L    Comment: Performed at [facility], [address], [place], [place] [address]  CBC     Status: Abnormal   Collection Time: 11/22/23  9:13 PM  Result Value Ref Range   WBC 6.4 4.0 - 10.5 K/uL   RBC 5.43 (H) 3.87 - 5.11 MIL/uL   Hemoglobin 15.6 (H) 12.0 - 15.0 g/dL   HCT 54.6 63.9 - 53.9 %   MCV 83.4 80.0 - 100.0 fL   MCH 28.7 26.0 - 34.0 pg   MCHC 34.4 30.0 - 36.0 g/dL   RDW 87.5 88.4 - 84.4 %   Platelets 397 150 - 400 K/uL   nRBC 0.3 (H) 0.0 - 0.2 %    Comment: Performed at [facility], [address] [place], [place] [address]  Comprehensive metabolic panel with GFR     Status: Abnormal   Collection Time: 11/22/23  9:13 PM  Result Value Ref Range   Sodium 126 (L) 135 - 145 mmol/L   Potassium 2.8 (L) 3.5 - 5.1 mmol/L   Chloride 74 (L) 98 - 111 mmol/L   CO2 33 (H) 22 - 32 mmol/L   Glucose, Bld 125 (H) 70 - 99 mg/dL    Comment: Glucose reference range applies only to samples taken after fasting for at least 8 hours.   BUN 20 8 - 23 mg/dL   Creatinine, Ser 8.59 (H) 0.44 - 1.00 mg/dL   Calcium  9.7 8.9 - 10.3 mg/dL   Total Protein 6.8 6.5 - 8.1 g/dL   Albumin 3.2 (L) 3.5 - 5.0 g/dL   AST 31 15 - 41 U/L   ALT 17 0 - 44 U/L   Alkaline Phosphatase 98 38 - 126 U/L   Total Bilirubin 1.3 (H) 0.0 - 1.2 mg/dL   GFR, Estimated 43 (L) >60 mL/min    Comment: (NOTE) Calculated using the CKD-EPI Creatinine Equation (2021)    Anion gap 19 (H) 5 - 15    Comment: ELECTROLYTES REPEATED TO VERIFY Performed at [facility], [address], [place], [place] [address]   Magnesium      Status: Abnormal   Collection Time: 11/22/23  9:13 PM  Result Value Ref Range   Magnesium  1.5 (L) 1.7 - 2.4 mg/dL    Comment: Performed at [facility], [address], [place], [place] [address]  I-stat chem 8, ED (not at [facility], [facility] or [facility])     Status: Abnormal   Collection Time: 11/22/23  9:41 PM  Result Value Ref Range   Sodium 125 (L) 135 - 145  mmol/L   Potassium 2.4 (LL) 3.5 - 5.1 mmol/L   Chloride 76 (L) 98 - 111 mmol/L   BUN 21 8 - 23 mg/dL   Creatinine, Ser 8.39 (H) 0.44 - 1.00 mg/dL  Glucose, Bld 124 (H) 70 - 99 mg/dL    Comment: Glucose reference range applies only to samples taken after fasting for at least 8 hours.   Calcium , Ion 1.00 (L) 1.15 - 1.40 mmol/L   TCO2 37 (H) 22 - 32 mmol/L   Hemoglobin 16.0 (H) 12.0 - 15.0 g/dL   HCT 52.9 (H) 63.9 - 53.9 %   Comment NOTIFIED PHYSICIAN   I-Stat CG4 Lactic Acid     Status: Abnormal   Collection Time: 11/23/23 12:18 AM  Result Value Ref Range   Lactic Acid, Venous 2.6 (HH) 0.5 - 1.9 mmol/L   Comment NOTIFIED PHYSICIAN   Lactic acid, plasma     Status: Abnormal   Collection Time: 11/23/23 12:20 AM  Result Value Ref Range   Lactic Acid, Venous 2.3 (HH) 0.5 - 1.9 mmol/L    Comment: CRITICAL RESULT CALLED TO, READ BACK BY AND VERIFIED WITH RN, RO G. 11/23/23 0043 MAULES Performed at [facility], [address] [place], [place] [address]   Lactic acid, plasma     Status: None   Collection Time: 11/23/23  3:00 AM  Result Value Ref Range   Lactic Acid, Venous 1.6 0.5 - 1.9 mmol/L    Comment: Performed at [facility], [address] [place], [place] [address]   CT ABDOMEN PELVIS W CONTRAST Result Date: 11/22/2023 CLINICAL DATA:  Possible bowel obstruction left upper quadrant pain with nausea EXAM: CT ABDOMEN AND PELVIS WITH CONTRAST TECHNIQUE: Multidetector CT imaging of the abdomen and pelvis was performed using the standard protocol following bolus administration of intravenous contrast. RADIATION DOSE REDUCTION: This exam was performed according to the departmental dose-optimization program which includes automated exposure control, adjustment of the mA and/or kV according to patient size and/or use of iterative reconstruction technique. CONTRAST:  75mL OMNIPAQUE  IOHEXOL  350 MG/ML SOLN COMPARISON:  CT 10/01/2023, 11/27/2021, 02/27/2016 FINDINGS: Lower chest: Lung  bases demonstrate no acute airspace disease. Hepatobiliary: Gallstones. No focal hepatic abnormality or biliary dilatation. Pancreas: Unremarkable. No pancreatic ductal dilatation or surrounding inflammatory changes. Spleen: Normal in size without focal abnormality. Adrenals/Urinary Tract: Adrenal glands within normal limits. Kidneys show no hydronephrosis. Poor excretion of contrast on delayed views. The bladder is unremarkable. Stomach/Bowel: Stomach decompressed. Chronically dilated fluid-filled small bowel within the anterior abdomen and pelvis as was seen on previous exam. Elsewhere, no dilated bowel or bowel wall thickening. Moderate stool burden. Moderate rectal feces. Vascular/Lymphatic: Aortic atherosclerosis. No enlarged abdominal or pelvic lymph nodes. Reproductive: No adnexal mass Other: Negative for pelvic effusion or free air. Musculoskeletal: No acute or suspicious osseous abnormality IMPRESSION: 1. Chronically dilated fluid-filled small bowel within the anterior abdomen and pelvis as was seen on previous exams. No convincing evidence for acute bowel obstruction. 2. Gallstones. 3. Aortic atherosclerosis. Aortic Atherosclerosis (ICD10-I70.0). Electronically Signed   By: Luke Bun M.D.   On: 11/22/2023 23:54   DG Chest Portable 1 View Result Date: 11/22/2023 EXAM: 1 VIEW XRAY OF THE CHEST 11/22/2023 09:14:00 PM COMPARISON: Comparison with x-ray 06/08/2022. CLINICAL HISTORY: chest pain. Chest pain chest pain. Chest pain FINDINGS: LUNGS AND PLEURA: No focal pulmonary opacity. No pulmonary edema. No pleural effusion. No pneumothorax. HEART AND MEDIASTINUM: No acute abnormality of the cardiac and mediastinal silhouettes. BONES AND SOFT TISSUES: No acute osseous abnormality. IMPRESSION: 1. No acute abnormalities. Electronically signed by: Norman Gatlin MD 11/22/2023 09:17 PM EDT RP Workstation: [id number]      Assessment/Plan 63 y/o F with complex surgical history as above who  presents with LUQ  pain. She is hemodynamically stable, without leukocytosis. She has no clinical evidence of bowel obstruction. CT scan of the abdomen and pelvic shows chronically dilated small bowel (present on previous CT scans) without inflammatory changes and moderate stool burden. No acute surgical needs. Consider GI consult if you need assistance with bowel regimen or have concern for underlying intestinal dysmotility. CCS will sign off. Call as needed.     I reviewed nursing notes, ED provider notes, hospitalist notes, last 24 h vitals and pain scores, last 48 h intake and output, last 24 h labs and trends, and last 24 h imaging results.  Zoe GORMAN Pringle, PA-C [facility] 11/23/2023, 10:40 AM Please see Amion for pager number during day hours 7:00am-4:30pm or 7:00am -11:30am on weekends

## 2023-11-23 NOTE — Plan of Care (Signed)

## 2023-11-24 DIAGNOSIS — I10 Essential (primary) hypertension: Secondary | ICD-10-CM | POA: Diagnosis not present

## 2023-11-24 DIAGNOSIS — R1032 Left lower quadrant pain: Secondary | ICD-10-CM | POA: Diagnosis not present

## 2023-11-24 DIAGNOSIS — K219 Gastro-esophageal reflux disease without esophagitis: Secondary | ICD-10-CM

## 2023-11-24 DIAGNOSIS — E876 Hypokalemia: Secondary | ICD-10-CM | POA: Diagnosis not present

## 2023-11-24 LAB — BASIC METABOLIC PANEL WITH GFR
Anion gap: 11 (ref 5–15)
Anion gap: 13 (ref 5–15)
BUN: 13 mg/dL (ref 8–23)
BUN: 13 mg/dL (ref 8–23)
CO2: 25 mmol/L (ref 22–32)
CO2: 29 mmol/L (ref 22–32)
Calcium: 8 mg/dL — ABNORMAL LOW (ref 8.9–10.3)
Calcium: 8.4 mg/dL — ABNORMAL LOW (ref 8.9–10.3)
Chloride: 85 mmol/L — ABNORMAL LOW (ref 98–111)
Chloride: 91 mmol/L — ABNORMAL LOW (ref 98–111)
Creatinine, Ser: 0.72 mg/dL (ref 0.44–1.00)
Creatinine, Ser: 0.78 mg/dL (ref 0.44–1.00)
GFR, Estimated: >60 mL/min (ref >60)
GFR, Estimated: >60 mL/min (ref >60)
Glucose, Bld: 76 mg/dL (ref 70–99)
Glucose, Bld: 96 mg/dL (ref 70–99)
Potassium: 3.7 mmol/L (ref 3.5–5.1)
Potassium: 3.7 mmol/L (ref 3.5–5.1)
Sodium: 127 mmol/L — ABNORMAL LOW (ref 135–145)
Sodium: 127 mmol/L — ABNORMAL LOW (ref 135–145)

## 2023-11-24 LAB — CBC
HCT: 34.3 % — ABNORMAL LOW (ref 36.0–46.0)
Hemoglobin: 11.5 g/dL — ABNORMAL LOW (ref 12.0–15.0)
MCH: 28.5 pg (ref 26.0–34.0)
MCHC: 33.5 g/dL (ref 30.0–36.0)
MCV: 85.1 fL (ref 80.0–100.0)
Platelets: 253 K/uL (ref 150–400)
RBC: 4.03 MIL/uL (ref 3.87–5.11)
RDW: 12.6 % (ref 11.5–15.5)
WBC: 5.1 K/uL (ref 4.0–10.5)
nRBC: 0 % (ref 0.0–0.2)

## 2023-11-24 MED ORDER — SENNOSIDES-DOCUSATE SODIUM 8.6-50 MG PO TABS: 2.0000 | ORAL_TABLET | Freq: Every day | ORAL | Status: DC

## 2023-11-24 MED ORDER — POTASSIUM CHLORIDE CRYS ER 20 MEQ PO TBCR
40.0000 meq | EXTENDED_RELEASE_TABLET | Freq: Once | ORAL | Status: AC
  Administered 2023-11-24: 40 meq via ORAL
  Filled 2023-11-24: qty 2

## 2023-11-24 MED ORDER — SENNOSIDES-DOCUSATE SODIUM 8.6-50 MG PO TABS
2.0000 | ORAL_TABLET | Freq: Two times a day (BID) | ORAL | Status: DC
  Filled 2023-11-24: qty 2

## 2023-11-24 MED ORDER — FUROSEMIDE 40 MG PO TABS
40.0000 mg | ORAL_TABLET | Freq: Every day | ORAL | Status: DC
Start: 2023-11-24 — End: 2023-11-25
  Administered 2023-11-24 – 2023-11-25 (×2): 40 mg via ORAL
  Filled 2023-11-24 (×2): qty 1

## 2023-11-24 MED ORDER — SERTRALINE HCL 50 MG PO TABS
50.0000 mg | ORAL_TABLET | Freq: Every day | ORAL | Status: DC
  Administered 2023-11-25: 50 mg via ORAL
  Filled 2023-11-24: qty 1

## 2023-11-24 NOTE — Evaluation (Signed)
 Physical Therapy Evaluation Patient Details Name: [name] MRN: [id number] DOB: 10/18/1960 Today's Date: 11/24/2023  History of Present Illness  Pt is a 63 y.o. female who presented from home with c/o L lower quadrant abdominal pain. Imaging revealed chronically dilated small bowel without inflammatory changes and moderate stool burden, no evidence of obstruction. PMH: COPD, asthma, HTN, GERD, arthritis, and multiple abdominal surgeries   Clinical Impression  Pt admitted with above diagnosis. PTA pt lived at home with family, mod I mobility/ADLs with SPC. Pt currently with functional limitations due to the deficits listed below (see PT Problem List). On eval, pt demo mod I bed mobility. Supervision transfers and amb 30' with RW. Pt will benefit from acute skilled PT to increase their independence and safety with mobility to allow discharge. PT to follow acutely. No follow up services indicated.         If plan is discharge home, recommend the following: Assist for transportation;Assistance with cooking/housework;Help with stairs or ramp for entrance   Can travel by private vehicle        Equipment Recommendations Rolling walker (2 wheels)  Recommendations for Other Services       Functional Status Assessment Patient has had a recent decline in their functional status and demonstrates the ability to make significant improvements in function in a reasonable and predictable amount of time.     Precautions / Restrictions Precautions Precautions: Fall      Mobility  Bed Mobility Overal bed mobility: Modified Independent             General bed mobility comments: +rail, increased time    Transfers Overall transfer level: Needs assistance Equipment used: Rolling walker (2 wheels) Transfers: Sit to/from Stand Sit to Stand: Supervision           General transfer comment: increased time    Ambulation/Gait Ambulation/Gait assistance: Supervision Gait Distance  (Feet): 30 Feet Assistive device: Rolling walker (2 wheels) Gait Pattern/deviations: Step-through pattern, Decreased stride length, Trunk flexed Gait velocity: decreased Gait velocity interpretation: <1.31 ft/sec, indicative of household ambulator   General Gait Details: Pt declining hallway amb. Steady gait with RW.  Stairs            Wheelchair Mobility     Tilt Bed    Modified Rankin (Stroke Patients Only)       Balance Overall balance assessment: Mild deficits observed, not formally tested                                           Pertinent Vitals/Pain Pain Assessment Pain Assessment: Faces Faces Pain Scale: Hurts a little bit Pain Location: abdomen Pain Descriptors / Indicators: Sore Pain Intervention(s): Monitored during session, Repositioned    Home Living Family/patient expects to be discharged to:: Private residence Living Arrangements: Children Available Help at Discharge: Family;Available 24 hours/day Type of Home: House Home Access: Stairs to enter Entrance Stairs-Rails: Doctor, general practice of Steps: 2   Home Layout: One level Home Equipment: Cane - single point;Shower seat      Prior Function Prior Level of Function : Independent/Modified Independent             Mobility Comments: cane for amb       Extremity/Trunk Assessment   Upper Extremity Assessment Upper Extremity Assessment: Overall WFL for tasks assessed    Lower Extremity Assessment Lower Extremity Assessment: Overall WFL for  tasks assessed    Cervical / Trunk Assessment Cervical / Trunk Assessment: Kyphotic  Communication   Communication Communication: No apparent difficulties    Cognition Arousal: Alert Behavior During Therapy: WFL for tasks assessed/performed   PT - Cognitive impairments: No apparent impairments                                 Cueing Cueing Techniques: Verbal cues     General Comments General  comments (skin integrity, edema, etc.): VSS on RA    Exercises     Assessment/Plan    PT Assessment Patient needs continued PT services  PT Problem List Pain;Decreased activity tolerance;Decreased balance;Decreased mobility;Decreased knowledge of use of DME       PT Treatment Interventions DME instruction;Therapeutic exercise;Gait training;Balance training;Stair training;Functional mobility training;Therapeutic activities;Patient/family education    PT Goals (Current goals can be found in the Care Plan section)  Acute Rehab PT Goals Patient Stated Goal: home PT Goal Formulation: With patient Time For Goal Achievement: 12/08/23 Potential to Achieve Goals: Good    Frequency Min 2X/week     Co-evaluation               AM-PAC PT 6 Clicks Mobility  Outcome Measure Help needed turning from your back to your side while in a flat bed without using bedrails?: None Help needed moving from lying on your back to sitting on the side of a flat bed without using bedrails?: None Help needed moving to and from a bed to a chair (including a wheelchair)?: A Little Help needed standing up from a chair using your arms (e.g., wheelchair or bedside chair)?: A Little Help needed to walk in hospital room?: A Little Help needed climbing 3-5 steps with a railing? : A Little 6 Click Score: 20    End of Session Equipment Utilized During Treatment: Gait belt Activity Tolerance: Patient tolerated treatment well Patient left: in chair;with call bell/phone within reach Nurse Communication: Mobility status PT Visit Diagnosis: Difficulty in walking, not elsewhere classified (R26.2)    Time: 9084-9062 PT Time Calculation (min) (ACUTE ONLY): 22 min   Charges:   PT Evaluation $PT Eval Moderate Complexity: 1 Mod   PT General Charges $$ ACUTE PT VISIT: 1 Visit         Sari MATSU., PT  Office # [phone]   Erven Sari Shaker 11/24/2023, 9:50 AM

## 2023-11-24 NOTE — Plan of Care (Signed)

## 2023-11-24 NOTE — TOC CM/SW Note (Addendum)
 Transition of Care [facility]) - Inpatient Brief Assessment   Patient Details  Name: [name] MRN: [id number] Date of Birth: [birth year]  Transition of Care [facility]) CM/SW Contact:    Tom-Johnson, Harvest Muskrat, RN Phone Number: 11/24/2023, 4:38 PM   Clinical Narrative:  Patient presented to the ED with Abdominal pains. CT scan Abdomen/Pelvic showed chronically dilated small bowel without inflammatory changes and moderate stool burden. Gen sx consulted, no surgical intervention at this time.   CM spoke with patient at bedside. Patient states she lives with her daughter [name]. Sates she is on disability for PTSD. Daughter transports and also uses public transportation. Has a cane, shower seat and rails at home.  PCP is Zoe Rojelio Caldron, NP and uses [facility] on [address].   No PT f/u, no ICM needs or recommendations noted at this time.  Patient not Medically ready for discharge.  CM will continue to follow as patient progresses with care towards discharge.              Transition of Care Asessment:

## 2023-11-24 NOTE — Progress Notes (Signed)
 Secure chat with Dr Raenelle re PIV request.  No IV meds are ordered at this time.  States okay to leave PIV out for now.

## 2023-11-24 NOTE — Progress Notes (Signed)
 PROGRESS NOTE        PATIENT DETAILS Name: [name] Age: 63 y.o. Sex: female Date of Birth: [birth year] Admit Date: 11/22/2023 Admitting Physician Micaela Speaker, MD ERE:Amntw, Rojelio Caldron, NP  Brief Summary: Patient is a 63 y.o.  female with history of COPD, HTN, GERD, chronic back pain, numerous prior laparotomies-presented with LLQ abdominal pain/nausea-on initial evaluation-some concern for SBO-however upon further evaluation with general surgical service-found to have fecal impaction.  Significant events: 10/1>> admit to [facility].  Significant studies: 9/30>> CT abdomen/pelvis: Chronically dilated fluid-filled small bowel within the anterior abdomen/pelvis. 9/30>> CXR: No acute abnormality.  Significant microbiology data: None  Procedures: None  Consults: General surgery  Subjective: Had BM yesterday and again this morning.  Abdominal pain has essentially resolved-no vomiting.  Objective: Vitals: Blood pressure 111/61, pulse 84, temperature 98.1 F (36.7 C), temperature source Oral, resp. rate 14, height 5' 2 (1.575 m), weight 60.8 kg, SpO2 96%.   Exam: Gen Exam:Alert awake-not in any distress HEENT:atraumatic, normocephalic Chest: B/L clear to auscultation anteriorly CVS:S1S2 regular Abdomen:soft non tender, non distended Extremities:no edema Neurology: Non focal Skin: no rash  Pertinent Labs/Radiology:    Latest Ref Rng & Units 11/24/2023    7:49 AM 11/23/2023    1:35 PM 11/22/2023    9:41 PM  CBC  WBC 4.0 - 10.5 K/uL 5.1  6.3    Hemoglobin 12.0 - 15.0 g/dL 88.4  87.6  83.9   Hematocrit 36.0 - 46.0 % 34.3  36.2  47.0   Platelets 150 - 400 K/uL 253  309      Lab Results  Component Value Date   NA 127 (L) 11/24/2023   K 3.7 11/24/2023   CL 91 (L) 11/24/2023   CO2 25 11/24/2023     Assessment/Plan: LLQ pain with nausea-likely due to fecal impaction Had 2 BMs overnight Continue bowel regimen with MiraLAX /senna Advance  diet Stop IVF Mobilize-if able to tolerate diet/continues to improve-likely home on 10/3.  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-and excessive free water intake Fluid restrict-stop IVF Resume furosemide Need to consider stopping Zoloft -but will have to taper it over the next 2-3 weeks (reduced to 50 mg 1 week, 25 mg for 1 week and then stop)  AKI Likely hemodynamically mediated Resolved  Hypokalemia Resolved  COPD Stable-lungs clear Bronchodilators  HTN Blood pressure stable Continue metoprolol Continue to hold losartan/amlodipine  for now  HLD Statin  GERD  PPI  Mood disorder Concerned that Zoloft  may be contributing to hyponatremia Will need to taper off over the next several weeks   Code status:   Code Status: Full Code   DVT Prophylaxis: enoxaparin  (LOVENOX ) injection 40 mg Start: 11/23/23 1400 SCDs Start: 11/23/23 0152 Place TED hose Start: 11/23/23 0152   Family Communication: None at bedside   Disposition Plan: Status is: Inpatient Remains inpatient appropriate because: Severity of illness   Planned Discharge Destination:Home   Diet: Diet Order             Diet clear liquid Room service appropriate? Yes; Fluid consistency: Thin  Diet effective now                     Antimicrobial agents: Anti-infectives (From admission, onward)    None        MEDICATIONS: Scheduled Meds:  bisacodyl   10 mg Oral Daily  bisacodyl   10 mg Rectal Once   enoxaparin  (LOVENOX ) injection  40 mg Subcutaneous Q24H   fluticasone  furoate-vilanterol  1 puff Inhalation Daily   metoprolol succinate  25 mg Oral Daily   pantoprazole   40 mg Oral Daily   polyethylene glycol  17 g Oral BID   rosuvastatin   10 mg Oral Daily   senna-docusate  2 tablet Oral QHS   sertraline   100 mg Oral Daily   sodium chloride  flush  3 mL Intravenous Q12H   sodium chloride  flush  3 mL Intravenous Q12H   sodium phosphate   1 enema Rectal Once    Continuous Infusions:  0.9 % NaCl with KCl 40 mEq / L 50 mL/hr at 11/24/23 0317   PRN Meds:.HYDROmorphone  (DILAUDID ) injection, ipratropium-albuterol , sodium chloride  flush   I have personally reviewed following labs and imaging studies  LABORATORY DATA: CBC: Recent Labs  Lab 11/22/23 2113 11/22/23 2141 11/23/23 1335 11/24/23 0749  WBC 6.4  --  6.3 5.1  HGB 15.6* 16.0* 12.3 11.5*  HCT 45.3 47.0* 36.2 34.3*  MCV 83.4  --  83.8 85.1  PLT 397  --  309 253    Basic Metabolic Panel: Recent Labs  Lab 11/22/23 2113 11/22/23 2141 11/23/23 1325 11/23/23 1332 11/23/23 1335 11/23/23 1906 11/23/23 2352 11/24/23 0749  NA 126*   < > 123*  --  125* 127* 127* 127*  K 2.8*   < > 3.2*  --  3.2* 3.7 3.7 3.7  CL 74*   < > 83*  --  82* 85* 85* 91*  CO2 33*  --  28  --  26 29 29 25   GLUCOSE 125*   < > 107*  --  107* 97 96 76  BUN 20   < > 16  --  15 14 13 13   CREATININE 1.40*   < > 0.82  --  0.82 0.84 0.78 0.72  CALCIUM  9.7  --  8.3*  --  8.4* 8.3* 8.4* 8.0*  MG 1.5*  --   --  1.7  --   --   --   --    < > = values in this interval not displayed.    GFR: Estimated Creatinine Clearance: 62.6 mL/min (by C-G formula based on SCr of 0.72 mg/dL).  Liver Function Tests: Recent Labs  Lab 11/22/23 2113 11/23/23 1335  AST 31 22  ALT 17 12  ALKPHOS 98 74  BILITOT 1.3* 1.0  PROT 6.8 5.7*  ALBUMIN 3.2* 2.4*   Recent Labs  Lab 11/22/23 2113  LIPASE 34   No results for input(s): AMMONIA in the last 168 hours.  Coagulation Profile: No results for input(s): INR, PROTIME in the last 168 hours.  Cardiac Enzymes: No results for input(s): CKTOTAL, CKMB, CKMBINDEX, TROPONINI in the last 168 hours.  BNP (last 3 results) No results for input(s): PROBNP in the last 8760 hours.  Lipid Profile: No results for input(s): CHOL, HDL, LDLCALC, TRIG, CHOLHDL, LDLDIRECT in the last 72 hours.  Thyroid Function Tests: No results for input(s): TSH, T4TOTAL,  FREET4, T3FREE, THYROIDAB in the last 72 hours.  Anemia Panel: No results for input(s): VITAMINB12, FOLATE, FERRITIN, TIBC, IRON, RETICCTPCT in the last 72 hours.  Urine analysis:    Component Value Date/Time   COLORURINE STRAW (A) 10/01/2023 2000   APPEARANCEUR CLEAR 10/01/2023 2000   LABSPEC 1.010 10/01/2023 2000   PHURINE 5.0 10/01/2023 2000   GLUCOSEU NEGATIVE 10/01/2023 2000   HGBUR NEGATIVE 10/01/2023 2000   BILIRUBINUR  NEGATIVE 10/01/2023 2000   KETONESUR NEGATIVE 10/01/2023 2000   PROTEINUR NEGATIVE 10/01/2023 2000   UROBILINOGEN 1.0 07/31/2014 0000   NITRITE NEGATIVE 10/01/2023 2000   LEUKOCYTESUR NEGATIVE 10/01/2023 2000    Sepsis Labs: Lactic Acid, Venous    Component Value Date/Time   LATICACIDVEN 1.6 11/23/2023 0300    MICROBIOLOGY: No results found for this or any previous visit (from the past 240 hours).  RADIOLOGY STUDIES/RESULTS: CT ABDOMEN PELVIS W CONTRAST Result Date: 11/22/2023 CLINICAL DATA:  Possible bowel obstruction left upper quadrant pain with nausea EXAM: CT ABDOMEN AND PELVIS WITH CONTRAST TECHNIQUE: Multidetector CT imaging of the abdomen and pelvis was performed using the standard protocol following bolus administration of intravenous contrast. RADIATION DOSE REDUCTION: This exam was performed according to the departmental dose-optimization program which includes automated exposure control, adjustment of the mA and/or kV according to patient size and/or use of iterative reconstruction technique. CONTRAST:  75mL OMNIPAQUE  IOHEXOL  350 MG/ML SOLN COMPARISON:  CT 10/01/2023, 11/27/2021, 02/27/2016 FINDINGS: Lower chest: Lung bases demonstrate no acute airspace disease. Hepatobiliary: Gallstones. No focal hepatic abnormality or biliary dilatation. Pancreas: Unremarkable. No pancreatic ductal dilatation or surrounding inflammatory changes. Spleen: Normal in size without focal abnormality. Adrenals/Urinary Tract: Adrenal glands within normal  limits. Kidneys show no hydronephrosis. Poor excretion of contrast on delayed views. The bladder is unremarkable. Stomach/Bowel: Stomach decompressed. Chronically dilated fluid-filled small bowel within the anterior abdomen and pelvis as was seen on previous exam. Elsewhere, no dilated bowel or bowel wall thickening. Moderate stool burden. Moderate rectal feces. Vascular/Lymphatic: Aortic atherosclerosis. No enlarged abdominal or pelvic lymph nodes. Reproductive: No adnexal mass Other: Negative for pelvic effusion or free air. Musculoskeletal: No acute or suspicious osseous abnormality IMPRESSION: 1. Chronically dilated fluid-filled small bowel within the anterior abdomen and pelvis as was seen on previous exams. No convincing evidence for acute bowel obstruction. 2. Gallstones. 3. Aortic atherosclerosis. Aortic Atherosclerosis (ICD10-I70.0). Electronically Signed   By: Luke Bun M.D.   On: 11/22/2023 23:54   DG Chest Portable 1 View Result Date: 11/22/2023 EXAM: 1 VIEW XRAY OF THE CHEST 11/22/2023 09:14:00 PM COMPARISON: Comparison with x-ray 06/08/2022. CLINICAL HISTORY: chest pain. Chest pain chest pain. Chest pain FINDINGS: LUNGS AND PLEURA: No focal pulmonary opacity. No pulmonary edema. No pleural effusion. No pneumothorax. HEART AND MEDIASTINUM: No acute abnormality of the cardiac and mediastinal silhouettes. BONES AND SOFT TISSUES: No acute osseous abnormality. IMPRESSION: 1. No acute abnormalities. Electronically signed by: Norman Gatlin MD 11/22/2023 09:17 PM EDT RP Workstation: [id number]     LOS: 1 day   Donalda Applebaum, MD  [facility]    To contact the attending provider between 7A-7P or the covering provider during after hours 7P-7A, please log into the web site [email or web address] and access using universal [place] password for that web site. If you do not have the password, please call the hospital operator.  11/24/2023, 10:14 AM

## 2023-11-25 ENCOUNTER — Other Ambulatory Visit (HOSPITAL_COMMUNITY): Payer: Self-pay

## 2023-11-25 DIAGNOSIS — K219 Gastro-esophageal reflux disease without esophagitis: Secondary | ICD-10-CM | POA: Diagnosis not present

## 2023-11-25 DIAGNOSIS — I10 Essential (primary) hypertension: Secondary | ICD-10-CM | POA: Diagnosis not present

## 2023-11-25 DIAGNOSIS — E876 Hypokalemia: Secondary | ICD-10-CM | POA: Diagnosis not present

## 2023-11-25 DIAGNOSIS — R1032 Left lower quadrant pain: Secondary | ICD-10-CM | POA: Diagnosis not present

## 2023-11-25 LAB — BASIC METABOLIC PANEL WITH GFR
Anion gap: 11 (ref 5–15)
BUN: 9 mg/dL (ref 8–23)
CO2: 28 mmol/L (ref 22–32)
Calcium: 8.4 mg/dL — ABNORMAL LOW (ref 8.9–10.3)
Chloride: 92 mmol/L — ABNORMAL LOW (ref 98–111)
Creatinine, Ser: 0.71 mg/dL (ref 0.44–1.00)
GFR, Estimated: >60 mL/min (ref >60)
Glucose, Bld: 78 mg/dL (ref 70–99)
Potassium: 3.5 mmol/L (ref 3.5–5.1)
Sodium: 131 mmol/L — ABNORMAL LOW (ref 135–145)

## 2023-11-25 MED ORDER — SERTRALINE HCL 50 MG PO TABS
ORAL_TABLET | ORAL | 0 refills | Status: DC
  Filled 2023-11-25: qty 14, 14d supply, fill #0

## 2023-11-25 MED ORDER — POLYETHYLENE GLYCOL 3350 17 GM/SCOOP PO POWD
17.0000 g | Freq: Two times a day (BID) | ORAL | 2 refills | Status: AC
  Filled 2023-11-25: qty 238, 7d supply, fill #0

## 2023-11-25 MED ORDER — SENNOSIDES-DOCUSATE SODIUM 8.6-50 MG PO TABS
2.0000 | ORAL_TABLET | Freq: Every day | ORAL | 1 refills | Status: AC
  Filled 2023-11-25: qty 60, 30d supply, fill #0

## 2023-11-25 MED ORDER — ONDANSETRON 4 MG PO TBDP
4.0000 mg | ORAL_TABLET | Freq: Three times a day (TID) | ORAL | Status: DC | PRN
  Administered 2023-11-25: 4 mg via ORAL
  Filled 2023-11-25 (×2): qty 1

## 2023-11-25 NOTE — TOC Transition Note (Signed)
 Transition of Care [facility]) - Discharge Note   Patient Details  Name: [name] MRN: [id number] Date of Birth: [birth year]  Transition of [facility]) CM/SW Contact:  Corean JAYSON Canary, RN Phone Number: 11/25/2023, 10:03 AM   Clinical Narrative:    [name] will be discharging today, A walker has been ordered for her to deliver prior to DC.   No other needs identified  Final next level of care: Home/Self Care     Patient Goals and CMS Choice            Discharge Placement                       Discharge Plan and Services Additional resources added to the After Visit Summary for                  DME Arranged: Walker rolling DME Agency: [employer] Date DME Agency Contacted: 11/25/23 Time DME Agency Contacted: [id number] Representative spoke with at DME Agency: [email or web address]            Social Drivers of Health (SDOH) Interventions SDOH Screenings   Food Insecurity: No Food Insecurity (11/23/2023)  Housing: High Risk (11/23/2023)  Transportation Needs: No Transportation Needs (11/23/2023)  Utilities: Not At Risk (11/23/2023)  Social Connections: Unknown (11/23/2023)  Tobacco Use: Low Risk  (11/23/2023)     Readmission Risk Interventions     No data to display

## 2023-11-25 NOTE — Plan of Care (Signed)

## 2023-11-25 NOTE — Discharge Summary (Signed)
 PATIENT DETAILS Name: [name] Age: 63 y.o. Sex: female Date of Birth: [birth year] MRN: [id number]. 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  Admitted From:  Home  Disposition: Home   Discharge Condition: good  CODE STATUS:   Code Status: Full Code   Diet recommendation:  Diet Order             Diet - low sodium heart healthy           DIET SOFT Room service appropriate? Yes; Fluid consistency: Thin; Fluid restriction: 1200 mL Fluid  Diet effective now                    Brief Summary: Patient is a 63 y.o.  female with history of COPD, HTN, GERD, chronic back pain, numerous prior laparotomies-presented with LLQ abdominal pain/nausea-on initial evaluation-some concern for SBO-however upon further evaluation with general surgical service-found to have fecal impaction.   Significant events: 10/1>> admit to [facility].   Significant studies: 9/30>> CT abdomen/pelvis: Chronically dilated fluid-filled small bowel within the anterior abdomen/pelvis. 9/30>> CXR: No acute abnormality.   Significant microbiology data: None   Procedures: None   Consults: General surgery  Brief Hospital Course: LLQ pain with nausea-likely due to fecal impaction Much improved-after having bowel movements Continue bowel regimen with MiraLAX /senna on discharge Diet advanced Continues to have some mild intermittent nausea-would be on antiemetics Abdominal exam is benign Plan to discharge home today.   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   AKI Likely hemodynamically mediated Resolved   Hypokalemia Resolved   COPD Stable-lungs clear Bronchodilators   HTN Blood pressure stable Continue metoprolol Continue to hold amlodipine /losartan as blood pressure is currently controlled just on metoprolol/furosemide-follow-up with PCP and reassess.    HLD Statin   GERD  PPI   Mood disorder Concerned that Zoloft  may be contributing to hyponatremia Will need to taper off over the next several weeks-see above documentation.   Discharge Diagnoses:  Principal Problem:   Left lower quadrant abdominal pain Active Problems:   Hypokalemia   GERD (gastroesophageal reflux disease)   Hyperlipidemia   Essential hypertension   Chronic lower back pain   Hyponatremia   Hypomagnesemia   Lactic acidosis   History of COPD   History of small bowel obstruction   History of hysterectomy   Generalized anxiety disorder   Discharge Instructions:  Activity:  As tolerated with Full fall precautions use walker/cane & assistance as needed  Discharge Instructions     Call MD for:  extreme fatigue   Complete by: As directed    Call MD for:  persistant dizziness or light-headedness   Complete by: As directed    Diet - low sodium heart healthy   Complete by: As directed  Discharge instructions   Complete by: As directed    Follow with Primary MD  Delores Rojelio Caldron, NP in 1-2 weeks  Please get a complete blood count and chemistry panel checked by your Primary MD at your next visit, and again as instructed by your  Primary MD.  Your blood pressure is currently just controlled with metoprolol and furosemide-please continue to hold amlodipine  and lisinopril -follow-up with your primary care doctor and resume those medications accordingly  Please follow-up with your primary care doctor-regarding an alternative agent to Zoloft -like we talked about-this will be tapered and discontinued in a matter of 2 weeks.  We are suspecting that this may be causing your low sodium levels.  Please stay on a fluid restriction of around 1.5 L of fluid a day (includes water/coffee/soft drinks)  Get Medicines reviewed and adjusted: Please take all your medications with you for your next visit with your Primary MD  Laboratory/radiological data: Please request your Primary MD to go over all hospital tests and procedure/radiological results at the follow up, please ask your Primary MD to get all Hospital records sent to his/her office.  In some cases, they will be blood work, cultures and biopsy results pending at the time of your discharge. Please request that your primary care M.D. follows up on these results.  Also Note the following: If you experience worsening of your admission symptoms, develop shortness of breath, life threatening emergency, suicidal or homicidal thoughts you must seek medical attention immediately by calling 911 or calling your MD immediately  if symptoms less severe.  You must read complete instructions/literature along with all the possible adverse reactions/side effects for all the Medicines you take and that have been prescribed to you. Take any new Medicines after you have completely understood and accpet all the possible adverse reactions/side effects.   Do not drive when taking Pain medications or sleeping medications (Benzodaizepines)  Do not take more than prescribed Pain, Sleep and Anxiety Medications. It is not advisable to combine anxiety,sleep and pain medications without talking with your  primary care practitioner  Special Instructions: If you have smoked or chewed Tobacco  in the last 2 yrs please stop smoking, stop any regular Alcohol  and or any Recreational drug use.  Wear Seat belts while driving.  Please note: You were cared for by a hospitalist during your hospital stay. Once you are discharged, your primary care physician will handle any further medical issues. Please note that NO REFILLS for any discharge medications will be authorized once you are discharged, as it is imperative that you return to your primary care physician (or establish a relationship with a primary care physician if you do not have one) for your post hospital discharge needs so that they can reassess your need for medications and monitor your lab values.   Increase activity slowly   Complete by: As directed       Allergies as of 11/25/2023       Reactions   Bee Venom Anaphylaxis, Itching   Peanuts [peanut Oil] Anaphylaxis, Itching   Shellfish Allergy Anaphylaxis, Itching   Aspirin Itching   Bronchospasm    Corn-containing Products Itching   Ibuprofen Itching   Latex Itching   Morphine  And Codeine Itching   Zofran  [ondansetron  Hcl] Itching, Rash        Medication List     PAUSE taking these medications    amLODipine  5 MG tablet Wait to take this until your doctor or other care provider tells you  to start again. Commonly known as: NORVASC  Take 5 mg by mouth daily.   losartan 50 MG tablet Wait to take this until your doctor or other care provider tells you to start again. Commonly known as: COZAAR Take 50 mg by mouth daily.       STOP taking these medications    senna 8.6 MG Tabs tablet Commonly known as: SENOKOT       TAKE these medications    albuterol  108 (90 Base) MCG/ACT inhaler Commonly known as: VENTOLIN  HFA Inhale 2 puffs into the lungs every 4 (four) hours as needed for wheezing or shortness of breath.   diclofenac  sodium 1 % Gel Commonly known as:  VOLTAREN  Apply 2 g topically 4 (four) times daily as needed (knee pain).   diphenhydrAMINE  25 MG tablet Commonly known as: BENADRYL  Take 25 mg by mouth every 6 (six) hours as needed for allergies (allergies).   esomeprazole 40 MG capsule Commonly known as: NEXIUM Take 40 mg by mouth daily at 12 noon.   fluticasone  50 MCG/ACT nasal spray Commonly known as: FLONASE  Place 1 spray into both nostrils daily.   fluticasone -salmeterol 100-50 MCG/ACT Aepb Commonly known as: ADVAIR Inhale 1 puff into the lungs 2 (two) times daily.   furosemide 20 MG tablet Commonly known as: LASIX Take 20 mg by mouth daily.   gabapentin  300 MG capsule Commonly known as: NEURONTIN  Take 300 mg by mouth 3 (three) times daily.   lactulose 10 GM/15ML solution Commonly known as: CHRONULAC Take 20 g by mouth daily.   metoprolol succinate 25 MG 24 hr tablet Commonly known as: TOPROL-XL Take 25 mg by mouth daily.   multivitamin with minerals Tabs tablet Take 1 tablet by mouth daily.   polyethylene glycol 17 g packet Commonly known as: MIRALAX  / GLYCOLAX  Take 17 g by mouth 2 (two) times daily. What changed:  when to take this reasons to take this   potassium chloride  10 MEQ tablet Commonly known as: KLOR-CON  M Take 10 mEq by mouth daily.   promethazine  25 MG tablet Commonly known as: PHENERGAN  Take 25 mg by mouth every 8 (eight) hours as needed for nausea or vomiting (nausea).   rosuvastatin  10 MG tablet Commonly known as: CRESTOR  Take 10 mg by mouth daily.   senna-docusate 8.6-50 MG tablet Commonly known as: Senokot-S Take 2 tablets by mouth at bedtime.   sertraline  50 MG tablet Commonly known as: ZOLOFT  Take 1 tablet (50 mg total) by mouth daily for 14 days. Take 50 mg for 1 week, and then decrease to 25 mg for 1 week and then stop. What changed:  medication strength how much to take additional instructions   SUMAtriptan 25 MG tablet Commonly known as: IMITREX Take 25 mg by mouth  every 2 (two) hours as needed for migraine or headache.        Follow-up Information     Delores Rojelio Caldron, NP. Schedule an appointment as soon as possible for a visit in 1 week(s).   Specialty: Nurse Practitioner Contact information: [address] [place] [address] [phone]                Allergies  Allergen Reactions   Bee Venom Anaphylaxis and Itching   Peanuts [Peanut Oil] Anaphylaxis and Itching   Shellfish Allergy Anaphylaxis and Itching   Aspirin Itching    Bronchospasm    Corn-Containing Products Itching   Ibuprofen Itching   Latex Itching   Morphine  And Codeine Itching  Zofran  [Ondansetron  Hcl] Itching and Rash     Other Procedures/Studies: CT ABDOMEN PELVIS W CONTRAST Result Date: 11/22/2023 CLINICAL DATA:  Possible bowel obstruction left upper quadrant pain with nausea EXAM: CT ABDOMEN AND PELVIS WITH CONTRAST TECHNIQUE: Multidetector CT imaging of the abdomen and pelvis was performed using the standard protocol following bolus administration of intravenous contrast. RADIATION DOSE REDUCTION: This exam was performed according to the departmental dose-optimization program which includes automated exposure control, adjustment of the mA and/or kV according to patient size and/or use of iterative reconstruction technique. CONTRAST:  75mL OMNIPAQUE  IOHEXOL  350 MG/ML SOLN COMPARISON:  CT 10/01/2023, 11/27/2021, 02/27/2016 FINDINGS: Lower chest: Lung bases demonstrate no acute airspace disease. Hepatobiliary: Gallstones. No focal hepatic abnormality or biliary dilatation. Pancreas: Unremarkable. No pancreatic ductal dilatation or surrounding inflammatory changes. Spleen: Normal in size without focal abnormality. Adrenals/Urinary Tract: Adrenal glands within normal limits. Kidneys show no hydronephrosis. Poor excretion of contrast on delayed views. The bladder is unremarkable. Stomach/Bowel: Stomach decompressed. Chronically dilated fluid-filled small  bowel within the anterior abdomen and pelvis as was seen on previous exam. Elsewhere, no dilated bowel or bowel wall thickening. Moderate stool burden. Moderate rectal feces. Vascular/Lymphatic: Aortic atherosclerosis. No enlarged abdominal or pelvic lymph nodes. Reproductive: No adnexal mass Other: Negative for pelvic effusion or free air. Musculoskeletal: No acute or suspicious osseous abnormality IMPRESSION: 1. Chronically dilated fluid-filled small bowel within the anterior abdomen and pelvis as was seen on previous exams. No convincing evidence for acute bowel obstruction. 2. Gallstones. 3. Aortic atherosclerosis. Aortic Atherosclerosis (ICD10-I70.0). Electronically Signed   By: Luke Bun M.D.   On: 11/22/2023 23:54   DG Chest Portable 1 View Result Date: 11/22/2023 EXAM: 1 VIEW XRAY OF THE CHEST 11/22/2023 09:14:00 PM COMPARISON: Comparison with x-ray 06/08/2022. CLINICAL HISTORY: chest pain. Chest pain chest pain. Chest pain FINDINGS: LUNGS AND PLEURA: No focal pulmonary opacity. No pulmonary edema. No pleural effusion. No pneumothorax. HEART AND MEDIASTINUM: No acute abnormality of the cardiac and mediastinal silhouettes. BONES AND SOFT TISSUES: No acute osseous abnormality. IMPRESSION: 1. No acute abnormalities. Electronically signed by: Norman Gatlin MD 11/22/2023 09:17 PM EDT RP Workstation: [id number]     TODAY-DAY OF DISCHARGE:  Subjective:   Christella Umphlett today has no headache,no chest abdominal pain,no new weakness tingling or numbness, feels much better wants to go home today.   Objective:   Blood pressure 116/62, pulse 88, temperature 97.8 F (36.6 C), temperature source Oral, resp. rate 20, height 5' 2 (1.575 m), weight 60.8 kg, SpO2 97%. No intake or output data in the 24 hours ending 11/25/23 0916 Filed Weights   11/22/23 2056  Weight: 60.8 kg    Exam: Awake Alert, Oriented *3, No new F.N deficits, Normal affect [place].AT,PERRAL Supple Neck,No JVD, No cervical  lymphadenopathy appriciated.  Symmetrical Chest wall movement, Good air movement bilaterally, CTAB RRR,No Gallops,Rubs or new Murmurs, No Parasternal Heave +ve B.Sounds, Abd Soft, Non tender, No organomegaly appriciated, No rebound -guarding or rigidity. No Cyanosis, Clubbing or edema, No new Rash or bruise   PERTINENT RADIOLOGIC STUDIES: No results found.   PERTINENT LAB RESULTS: CBC: Recent Labs    11/23/23 1335 11/24/23 0749  WBC 6.3 5.1  HGB 12.3 11.5*  HCT 36.2 34.3*  PLT 309 253   CMET CMP     Component Value Date/Time   NA 131 (L) 11/25/2023 0249   K 3.5 11/25/2023 0249   CL 92 (L) 11/25/2023 0249   CO2 28 11/25/2023 0249   GLUCOSE 78 11/25/2023 0249  BUN 9 11/25/2023 0249   CREATININE 0.71 11/25/2023 0249   CALCIUM  8.4 (L) 11/25/2023 0249   PROT 5.7 (L) 11/23/2023 1335   ALBUMIN 2.4 (L) 11/23/2023 1335   AST 22 11/23/2023 1335   ALT 12 11/23/2023 1335   ALKPHOS 74 11/23/2023 1335   BILITOT 1.0 11/23/2023 1335   GFRNONAA >60 11/25/2023 0249    GFR Estimated Creatinine Clearance: 62.6 mL/min (by C-G formula based on SCr of 0.71 mg/dL). Recent Labs    11/22/23 2113  LIPASE 34   No results for input(s): CKTOTAL, CKMB, CKMBINDEX, TROPONINI in the last 72 hours. Invalid input(s): POCBNP No results for input(s): DDIMER in the last 72 hours. No results for input(s): HGBA1C in the last 72 hours. No results for input(s): CHOL, HDL, LDLCALC, TRIG, CHOLHDL, LDLDIRECT in the last 72 hours. No results for input(s): TSH, T4TOTAL, T3FREE, THYROIDAB in the last 72 hours.  Invalid input(s): FREET3 No results for input(s): VITAMINB12, FOLATE, FERRITIN, TIBC, IRON, RETICCTPCT in the last 72 hours. Coags: No results for input(s): INR in the last 72 hours.  Invalid input(s): PT Microbiology: No results found for this or any previous visit (from the past 240 hours).  FURTHER DISCHARGE INSTRUCTIONS:  Get  Medicines reviewed and adjusted: Please take all your medications with you for your next visit with your Primary MD  Laboratory/radiological data: Please request your Primary MD to go over all hospital tests and procedure/radiological results at the follow up, please ask your Primary MD to get all Hospital records sent to his/her office.  In some cases, they will be blood work, cultures and biopsy results pending at the time of your discharge. Please request that your primary care M.D. goes through all the records of your hospital data and follows up on these results.  Also Note the following: If you experience worsening of your admission symptoms, develop shortness of breath, life threatening emergency, suicidal or homicidal thoughts you must seek medical attention immediately by calling 911 or calling your MD immediately  if symptoms less severe.  You must read complete instructions/literature along with all the possible adverse reactions/side effects for all the Medicines you take and that have been prescribed to you. Take any new Medicines after you have completely understood and accpet all the possible adverse reactions/side effects.   Do not drive when taking Pain medications or sleeping medications (Benzodaizepines)  Do not take more than prescribed Pain, Sleep and Anxiety Medications. It is not advisable to combine anxiety,sleep and pain medications without talking with your primary care practitioner  Special Instructions: If you have smoked or chewed Tobacco  in the last 2 yrs please stop smoking, stop any regular Alcohol  and or any Recreational drug use.  Wear Seat belts while driving.  Please note: You were cared for by a hospitalist during your hospital stay. Once you are discharged, your primary care physician will handle any further medical issues. Please note that NO REFILLS for any discharge medications will be authorized once you are discharged, as it is imperative that you  return to your primary care physician (or establish a relationship with a primary care physician if you do not have one) for your post hospital discharge needs so that they can reassess your need for medications and monitor your lab values.  Total Time spent coordinating discharge including counseling, education and face to face time equals greater than 30 minutes.  SignedBETHA Donalda Applebaum 11/25/2023 9:16 AM

## 2023-11-25 NOTE — Evaluation (Signed)
 Occupational Therapy Evaluation and Discharge Patient Details Name: [name] MRN: [id number] DOB: 04/15/1960 Today's Date: 11/25/2023   History of Present Illness   Pt is a 63 y.o. female who presented from home with c/o L lower quadrant abdominal pain. Imaging revealed chronically dilated small bowel without inflammatory changes and moderate stool burden, no evidence of obstruction. PMH: COPD, asthma, HTN, GERD, arthritis, and multiple abdominal surgeries     Clinical Impressions At baseline, pt is Ind to Mod I with ADLs and uses a [facility] for functional mobility. Pt now presents with mildly decreased activity tolerance and mild noted balance deficits but with no overt LOB during session. Pt currently demonstrates ability to complete ADLs Ind to Supervision for safety and functional mobility with a RW with Supervision for safety with no physical assist needed. Pt VSS on RA. Pt also reports she feels she is now at or very near baseline PLOF. Pt further reports family can assist with IADLs and ADLs PRN upon return home. As pt is now at or very near baseline, no further benefit from acute OT services at this time and no post acute OT needs are anticipated. OT is signing off at this time.      If plan is discharge home, recommend the following:   A little help with walking and/or transfers;A little help with bathing/dressing/bathroom;Assistance with cooking/housework;Assist for transportation;Help with stairs or ramp for entrance     Functional Status Assessment   Patient has had a recent decline in their functional status and demonstrates the ability to make significant improvements in function in a reasonable and predictable amount of time.     Equipment Recommendations   Other (comment) (RW)     Recommendations for Other Services         Precautions/Restrictions   Precautions Precautions: Fall Restrictions Weight Bearing Restrictions Per Provider Order: No      Mobility Bed Mobility Overal bed mobility: Modified Independent                  Transfers Overall transfer level: Needs assistance Equipment used: Rolling walker (2 wheels) Transfers: Bed to chair/wheelchair/BSC, Sit to/from Stand Sit to Stand: Supervision     Step pivot transfers: Supervision     General transfer comment: increased time; Supervision for safety      Balance Overall balance assessment: Mild deficits observed, not formally tested                                         ADL either performed or assessed with clinical judgement   ADL Overall ADL's : Needs assistance/impaired;Modified independent Eating/Feeding: Independent;Sitting   Grooming: Supervision/safety;Standing   Upper Body Bathing: Set up;Sitting   Lower Body Bathing: Supervison/ safety;Sit to/from stand   Upper Body Dressing : Set up;Sitting   Lower Body Dressing: Supervision/safety;Sit to/from stand   Toilet Transfer: Supervision/safety;Ambulation;Regular Toilet;Rolling walker (2 wheels)   Toileting- Clothing Manipulation and Hygiene: Modified independent;Sit to/from stand;Sitting/lateral lean       Functional mobility during ADLs: Supervision/safety;Rolling walker (2 wheels) General ADL Comments: Pt with decreased activity tolerance and mild balance deficits observed, but with no overt LOB during session.     Vision Baseline Vision/History: 1 Wears glasses Ability to See in Adequate Light: 0 Adequate (with glasses on) Patient Visual Report: No change from baseline Additional Comments: Vision [facility] for tasks assessed with glasses on; not formally screened or  evaluated     Perception         Praxis         Pertinent Vitals/Pain Pain Assessment Pain Assessment: 0-10 Pain Score: 2  Pain Location: abdomen Pain Descriptors / Indicators: Sore Pain Intervention(s): Monitored during session     Extremity/Trunk Assessment Upper Extremity Assessment Upper  Extremity Assessment: Right hand dominant;Overall [facility] for tasks assessed   Lower Extremity Assessment Lower Extremity Assessment: Defer to PT evaluation   Cervical / Trunk Assessment Cervical / Trunk Assessment: Kyphotic   Communication Communication Communication: No apparent difficulties   Cognition Arousal: Alert Behavior During Therapy: WFL for tasks assessed/performed Cognition: No apparent impairments             OT - Cognition Comments: Pt AAOx4 and pleasant throughout session. Cognition WFL for tasks assessed; cognition not formally screened or evaluated.                 Following commands: Intact       Cueing  General Comments   Cueing Techniques: Verbal cues  VSS on RA   Exercises     Shoulder Instructions      Home Living Family/patient expects to be discharged to:: Private residence Living Arrangements: Children Available Help at Discharge: Family;Available 24 hours/day Type of Home: House Home Access: Stairs to enter [employer] of Steps: 2 Entrance Stairs-Rails: Right;Left Home Layout: One level     Bathroom Shower/Tub: Chief Strategy Officer: Standard     Home Equipment: Cane - single point;Shower seat          Prior Functioning/Environment Prior Level of Function : Independent/Modified Independent             Mobility Comments: cane for amb ADLs Comments: Ind to [employer] I    OT Problem List: Decreased activity tolerance   OT Treatment/Interventions:        OT Goals(Current goals can be found in the care plan section)   Acute Rehab OT Goals Patient Stated Goal: to return home OT Goal Formulation: All assessment and education complete, DC therapy   OT Frequency:       Co-evaluation              AM-PAC OT 6 Clicks Daily Activity     Outcome Measure Help from another person eating meals?: None Help from another person taking care of personal grooming?: A Little Help from another  person toileting, which includes using toliet, bedpan, or urinal?: A Little Help from another person bathing (including washing, rinsing, drying)?: A Little Help from another person to put on and taking off regular upper body clothing?: A Little Help from another person to put on and taking off regular lower body clothing?: A Little 6 Click Score: 19   End of Session Equipment Utilized During Treatment: Rolling walker (2 wheels) Nurse Communication: Mobility status  Activity Tolerance: Patient tolerated treatment well Patient left: in bed;with call bell/phone within reach (sitting EOB)  OT Visit Diagnosis: Other (comment) (decreased activity tolerance)                Time: 9061-9047 OT Time Calculation (min): 14 min Charges:  OT General Charges $OT Visit: 1 Visit OT Evaluation $OT Eval Low Complexity: 1 Low  Margarie Rockey HERO., OTR/L, MA Acute Rehab [phone]   Margarie FORBES Horns 11/25/2023, 12:21 PM

## 2023-12-26 ENCOUNTER — Encounter: Payer: Self-pay | Admitting: Gastroenterology

## 2023-12-29 ENCOUNTER — Encounter (HOSPITAL_COMMUNITY): Payer: Self-pay

## 2023-12-29 ENCOUNTER — Ambulatory Visit (INDEPENDENT_AMBULATORY_CARE_PROVIDER_SITE_OTHER)

## 2023-12-29 ENCOUNTER — Ambulatory Visit (HOSPITAL_COMMUNITY)
Admission: EM | Admit: 2023-12-29 | Discharge: 2023-12-29 | Disposition: A | Discharge status: Home / Self Care | Attending: Family Medicine | Admitting: Family Medicine

## 2023-12-29 DIAGNOSIS — R051 Acute cough: Secondary | ICD-10-CM | POA: Diagnosis not present

## 2023-12-29 NOTE — ED Triage Notes (Signed)
 Patient here today with c/o cough, rib pain, loss of appetite, ST, nausea, and numbness in mouth X 10 days. Denies taking anything for symptoms. No known sick contacts.

## 2024-01-26 ENCOUNTER — Ambulatory Visit (HOSPITAL_COMMUNITY)

## 2024-01-26 ENCOUNTER — Ambulatory Visit (HOSPITAL_COMMUNITY): Admission: EM | Admit: 2024-01-26 | Discharge: 2024-01-26 | Disposition: A | Discharge status: Home / Self Care

## 2024-01-26 ENCOUNTER — Encounter (HOSPITAL_COMMUNITY): Payer: Self-pay

## 2024-01-26 DIAGNOSIS — Z23 Encounter for immunization: Secondary | ICD-10-CM | POA: Diagnosis not present

## 2024-01-26 DIAGNOSIS — S50312A Abrasion of left elbow, initial encounter: Secondary | ICD-10-CM | POA: Diagnosis not present

## 2024-01-26 DIAGNOSIS — W19XXXA Unspecified fall, initial encounter: Secondary | ICD-10-CM

## 2024-01-26 MED ORDER — TETANUS-DIPHTH-ACELL PERTUSSIS 5-2-15.5 LF-MCG/0.5 IM SUSP
INTRAMUSCULAR | Status: AC
  Filled 2024-01-26: qty 0.5

## 2024-01-26 MED ORDER — TETANUS-DIPHTH-ACELL PERTUSSIS 5-2-15.5 LF-MCG/0.5 IM SUSP
0.5000 mL | Freq: Once | INTRAMUSCULAR | Status: AC
  Administered 2024-01-26: 0.5 mL via INTRAMUSCULAR

## 2024-01-26 NOTE — ED Triage Notes (Addendum)
 Patient reports that her left knee gave out and she fell on her left side outside.Patient states she was on her way to physical therapy. Patient has bruising and pain to the left elbow. Patient also c/o pain from the left knee to the left hip. Patient is currently walking with a walker.  Patient has not taken anything for pain.    Patient states she has been out in the cold since 0900 today. Nailbeds blue.

## 2024-01-26 NOTE — ED Notes (Signed)
 Attempts x 2 by 2 different RN's. Unable to draw blood. Provider notified.

## 2024-01-26 NOTE — ED Provider Notes (Signed)
 [facility]    CSN: [id number] Arrival date & time: 01/26/24  1149      History   Chief Complaint Chief Complaint  Patient presents with  . Fall    HPI [name] is a 63 y.o. female.   This 63 year old female is being seen for complaints of left elbow and left rib pain after suffering a fall this morning.  She reports she was outside waiting for the bus when her left knee gave out causing her to fall to the ground.  She says she was on the ground for approximately 2 minutes when some students saw her and assisted her back up to her feet.  After the fall, she went to physical therapy, then reported here for evaluation.  She denies LOC, dizziness, headache, vision changes.  She denies striking her head.  She denies chest pain, shortness of breath.  She denies abdominal pain, nausea, vomiting.  Initially blood pressure was low, 87/67.  Repeat blood pressure 95/67.  She has not taken blood pressure medicines this morning.  She says her tetanus is up-to-date.  When questioned further, she says that her primary care provider keeps up with her vaccinations.  Chart review reveals she has not seen her PCP in a while.   Fall Pertinent negatives include no chest pain, no abdominal pain, no headaches and no shortness of breath.    Past Medical History:  Diagnosis Date  . Asthma   . Cataract   . Chronic bronchitis (HCC)   . Chronic lower back pain   . COPD (chronic obstructive pulmonary disease) (HCC)   . Depression   . GERD (gastroesophageal reflux disease)   . Heart murmur   . History of blood transfusion 2003-2004   related to bowel obstruction OR  . Hyperlipidemia   . Hypertension   . Insomnia   . Osteoarthritis    lower back; both hips; right leg (08/01/2014)  . SBO (small bowel obstruction) (HCC) 10/2015    Patient Active Problem List   Diagnosis Date Noted  . Hyponatremia 11/23/2023  . Hypomagnesemia 11/23/2023  . Lactic acidosis 11/23/2023  . History of  COPD 11/23/2023  . History of small bowel obstruction 11/23/2023  . History of hysterectomy 11/23/2023  . Generalized anxiety disorder 11/23/2023  . Nausea vomiting and diarrhea   . Heart murmur 11/27/2021  . Chronic lower back pain 11/27/2021  . Encounter for imaging study to confirm nasogastric (NG) tube placement   . Choledocholithiasis   . Calculus of gallbladder with biliary obstruction but without cholecystitis   . Small bowel obstruction (HCC)   . Essential hypertension   . Gastroesophageal reflux disease with esophagitis   . Simple chronic bronchitis (HCC)   . SBO (small bowel obstruction) (HCC) 08/14/2015  . Hypokalemia 08/14/2015  . Left lower quadrant abdominal pain   . Partial small bowel obstruction (HCC) 03/22/2014  . Abdominal pain 03/22/2014  . COPD (chronic obstructive pulmonary disease) (HCC)   . Hypertension   . GERD (gastroesophageal reflux disease)   . Hyperlipidemia   . Osteoarthritis   . Asthma 06/03/2011  . Family history of cardiovascular disease 06/03/2011  . Depressive disorder 06/03/2011    Past Surgical History:  Procedure Laterality Date  . ABDOMINAL EXPLORATION SURGERY  2004   abdominal compartment syndrome  . ABDOMINAL EXPLORATION SURGERY  2003   small bowel prolapse through vagina  . ABDOMINAL HYSTERECTOMY  2003  . ADENOIDECTOMY  2002  . BOWEL RESECTION  2003; 2004  x2 with primary anastamosis  . COMBINED HYSTEROSCOPY DIAGNOSTIC / D&C  ~ 2003  . HERNIA REPAIR    . JOINT REPLACEMENT    . LAPAROSCOPIC INCISIONAL / UMBILICAL / VENTRAL HERNIA REPAIR  2012   [facility], Dr. Adelia  . TONSILLECTOMY  1977  . TOTAL KNEE ARTHROPLASTY Right 03/2012   [facility]    OB History   No obstetric history on file.      Home Medications    Prior to Admission medications   Medication Sig Start Date End Date Taking? Authorizing Provider  albuterol  (VENTOLIN  HFA) 108 (90 Base) MCG/ACT inhaler Inhale 2 puffs into the lungs every 4 (four) hours as needed for  wheezing or shortness of breath. 06/08/22   Dreama, [place]  N, FNP  amLODipine  (NORVASC ) 5 MG tablet Take 5 mg by mouth daily. 11/01/23   [provider]  buPROPion (WELLBUTRIN XL) 150 MG 24 hr tablet Take 150 mg by mouth daily. 12/20/23   [provider]  diclofenac  sodium (VOLTAREN ) 1 % GEL Apply 2 g topically 4 (four) times daily as needed (knee pain).     [provider]  diphenhydrAMINE  (BENADRYL ) 25 MG tablet Take 25 mg by mouth every 6 (six) hours as needed for allergies (allergies).    [provider]  esomeprazole (NEXIUM) 40 MG capsule Take 40 mg by mouth daily at 12 noon.    [provider]  fluticasone  (FLONASE ) 50 MCG/ACT nasal spray Place 1 spray into both nostrils daily. 06/08/22   Dreama, [place]  N, FNP  fluticasone -salmeterol (ADVAIR) 100-50 MCG/ACT AEPB Inhale 1 puff into the lungs 2 (two) times daily. 10/31/23   [provider]  furosemide  (LASIX ) 20 MG tablet Take 20 mg by mouth daily. 10/31/23   [provider]  gabapentin  (NEURONTIN ) 300 MG capsule Take 300 mg by mouth 3 (three) times daily.    [provider]  lactulose (CHRONULAC) 10 GM/15ML solution Take 20 g by mouth daily. 11/07/23   [provider]  losartan (COZAAR) 50 MG tablet Take 50 mg by mouth daily. 10/31/23   [provider]  metoprolol  succinate (TOPROL -XL) 25 MG 24 hr tablet Take 25 mg by mouth daily. 11/02/23   [provider]  Multiple Vitamin (MULTIVITAMIN WITH MINERALS) TABS tablet Take 1 tablet by mouth daily.    [provider]  polyethylene glycol powder (GLYCOLAX /MIRALAX ) 17 GM/SCOOP powder Take 17 g by mouth 2 (two) times daily. Dissolve 1 capful (17g) in 4-8 ounces of liquid and take by mouth daily. 11/25/23 02/23/24  Ghimire, Donalda HERO, MD  potassium chloride  (KLOR-CON  M) 10 MEQ tablet Take 10 mEq by mouth daily. 10/31/23   [provider]  promethazine  (PHENERGAN ) 25 MG tablet Take 25 mg by mouth  every 8 (eight) hours as needed for nausea or vomiting (nausea).  08/20/13   [provider]  rosuvastatin  (CRESTOR ) 10 MG tablet Take 10 mg by mouth daily.    [provider]  senna-docusate (SENOKOT-S) 8.6-50 MG tablet Take 2 tablets by mouth at bedtime. 11/25/23   Ghimire, Donalda HERO, MD  SUMAtriptan (IMITREX) 25 MG tablet Take 25 mg by mouth every 2 (two) hours as needed for migraine or headache. 10/31/23   [provider]    Family History Family History  Problem Relation Age of Onset  . Breast cancer Mother   . Cancer Mother   . Heart attack Father   . Colon cancer Maternal Grandmother   . Colon cancer Paternal Grandmother  Social History Social History   Tobacco Use  . Smoking status: Never  . Smokeless tobacco: Never  Vaping Use  . Vaping status: Never Used  Substance Use Topics  . Alcohol use: No    Alcohol/week: 0.0 standard drinks of alcohol  . Drug use: No     Allergies   Bee venom, Peanuts [peanut oil], Shellfish allergy, Aspirin, Corn-containing products, Ibuprofen, Latex, Morphine  and codeine, and Zofran  [ondansetron  hcl]   Review of Systems Review of Systems  Constitutional:  Negative for chills and fever.  HENT:  Negative for dental problem, facial swelling and nosebleeds.   Eyes:  Negative for pain and visual disturbance.  Respiratory:  Negative for shortness of breath.   Cardiovascular:  Negative for chest pain.  Gastrointestinal:  Negative for abdominal pain, nausea and vomiting.  Genitourinary:  Negative for difficulty urinating, frequency and urgency.  Musculoskeletal:  Positive for arthralgias and myalgias. Negative for back pain and neck pain.  Skin:  Positive for wound.  Neurological:  Negative for dizziness, syncope, weakness, light-headedness, numbness and headaches.     Physical Exam Triage Vital Signs ED Triage Vitals  Encounter Vitals Group     BP 01/26/24 1203 (!) 87/67     Girls Systolic BP Percentile --       Girls Diastolic BP Percentile --      Boys Systolic BP Percentile --      Boys Diastolic BP Percentile --      Pulse Rate 01/26/24 1221 83     Resp --      Temp 01/26/24 1203 97.7 F (36.5 C)     Temp Source 01/26/24 1203 Oral     SpO2 01/26/24 1221 93 %     Weight --      Height --      Head Circumference --      Peak Flow --      Pain Score 01/26/24 1203 3     Pain Loc --      Pain Education --      Exclude from Growth Chart --    Orthostatic VS for the past 24 hrs:  BP- Lying Pulse- Lying BP- Sitting Pulse- Sitting BP- Standing at 0 minutes Pulse- Standing at 0 minutes  01/26/24 1249 (!) 84/64 79 98/71 74 (!) 88/65 79    Updated Vital Signs BP 95/67 (BP Location: Right Arm)   Pulse 83   Temp 97.7 F (36.5 C) (Oral)   SpO2 93%   Visual Acuity Right Eye Distance:   Left Eye Distance:   Bilateral Distance:    Right Eye Near:   Left Eye Near:    Bilateral Near:     Physical Exam Vitals and nursing note reviewed.  Constitutional:      General: She is awake. She is not in acute distress.    Appearance: She is well-developed. She is not toxic-appearing.     Comments: Pleasant female appearing stated age found sitting in chair in no acute distress.  HENT:     Head: Normocephalic and atraumatic.     Right Ear: External ear normal.     Left Ear: External ear normal.     Nose: Nose normal.     Mouth/Throat:     Lips: Pink.     Mouth: Mucous membranes are moist. No injury.     Pharynx: Oropharynx is clear.  Eyes:     General: Lids are normal.     Extraocular Movements: Extraocular movements intact.  Conjunctiva/sclera: Conjunctivae normal.  Cardiovascular:     Rate and Rhythm: Normal rate and regular rhythm.     Heart sounds: Normal heart sounds. No murmur heard. Pulmonary:     Effort: Pulmonary effort is normal. No respiratory distress.     Breath sounds: Normal breath sounds.  Chest:     Chest wall: Tenderness present. No lacerations, deformity,  swelling, crepitus or edema.    Abdominal:     Palpations: Abdomen is soft.     Tenderness: There is no abdominal tenderness. There is no guarding.  Musculoskeletal:        General: No swelling.     Left upper arm: No deformity, tenderness or bony tenderness.     Left elbow: No deformity. Tenderness present.       Arms:     Cervical back: Neck supple. No edema. No pain with movement, spinous process tenderness or muscular tenderness.     Comments: Abrasion left elbow  Skin:    General: Skin is warm and dry.  Neurological:     Mental Status: She is alert and oriented to person, place, and time.     Motor: Motor function is intact.  Psychiatric:        Mood and Affect: Mood normal.        Behavior: Behavior is cooperative.      UC Treatments / Results  Labs (all labs ordered are listed, but only abnormal results are displayed) Labs Reviewed  CBC WITH DIFFERENTIAL/PLATELET  BASIC METABOLIC PANEL WITH GFR     EKG   Radiology No results found.  Procedures Procedures (including critical care time)  Medications Ordered in UC Medications  Tdap (ADACEL ) injection 0.5 mL (0.5 mLs Intramuscular Given 01/26/24 1245)    Initial Impression / Assessment and Plan / UC Course  I have reviewed the triage vital signs and the nursing notes.  Pertinent labs & imaging results that were available during my care of the patient were reviewed by me and considered in my medical decision making (see chart for details).     Vitals and triage reviewed.  Patient presents after suffering a fall this morning.  Presentation blood pressure 87/67.  Repeat blood pressure 95/67.  Left elbow x-ray obtained.  Tetanus booster given.  Orthostatic vitals completed with abnormal readings, blood pressure in the 80s systolic for all readings.  Discussed with patient these abnormalities.  Advised patient will need to be evaluated in the emergency department.  She is resistant to transfer to emergency  department.  Explained the seriousness of low blood pressure including stress on the heart, potential for organ dysfunction, increased risk of additional falls and injury.  Discussed limitations of urgent care setting.  Extensive discussion regarding risks of refusing further evaluation in emergency department, she verbalizes understanding.  She continues to decline transfer via ambulance or to present on her own to the emergency department.  She is agreeable to us  collecting lab work so she can be called if there are abnormalities.  CBC, BMP obtained.  She will be notified via telephone if there are abnormalities.  AMA paperwork presented, reviewed, and signed.  Patient is alert and oriented at time of signature. Final Clinical Impressions(s) / UC Diagnoses   Final diagnoses:  Fall, initial encounter     Discharge Instructions      Requires higher level of care with resources not available at urgent care. Patient refused transfer to emergency department for further evaluation of hypotension. Extensive discussion regarding the benefits of  emergency department evaluation and risks of not going. If you develop any new or worsening symptoms or severe symptoms including dizziness, vision changes, weakness, additional falls, chest pain, shortness of breath, abdominal pain, nausea/vomiting please go to the emergency department.     ED Prescriptions   None    PDMP not reviewed this encounter.   Lennice Jon BROCKS, FNP 01/30/24 [phone]

## 2024-01-26 NOTE — Discharge Instructions (Addendum)
 Requires higher level of care with resources not available at urgent care. Patient refused transfer to emergency department for further evaluation of hypotension. Extensive discussion regarding the benefits of emergency department evaluation and risks of not going. If you develop any new or worsening symptoms or severe symptoms including dizziness, vision changes, weakness, additional falls, chest pain, shortness of breath, abdominal pain, nausea/vomiting please go to the emergency department.

## 2024-02-13 ENCOUNTER — Ambulatory Visit: Admitting: Gastroenterology

## 2024-03-02 ENCOUNTER — Other Ambulatory Visit: Payer: Self-pay | Admitting: Nurse Practitioner

## 2024-03-02 DIAGNOSIS — Z1231 Encounter for screening mammogram for malignant neoplasm of breast: Secondary | ICD-10-CM

## 2024-03-20 ENCOUNTER — Ambulatory Visit: Admitting: Gastroenterology
